# Patient Record
Sex: Female | Born: 1945
Health system: Southern US, Community
[De-identification: ages and names within clinical notes are randomized; demographics above are authoritative.]

## PROBLEM LIST (undated history)

## (undated) DIAGNOSIS — F32A Depression, unspecified: Secondary | ICD-10-CM

## (undated) DIAGNOSIS — F5104 Psychophysiologic insomnia: Secondary | ICD-10-CM

## (undated) DIAGNOSIS — I10 Essential (primary) hypertension: Secondary | ICD-10-CM

## (undated) DIAGNOSIS — E785 Hyperlipidemia, unspecified: Secondary | ICD-10-CM

## (undated) DIAGNOSIS — R945 Abnormal results of liver function studies: Secondary | ICD-10-CM

## (undated) DIAGNOSIS — F329 Major depressive disorder, single episode, unspecified: Secondary | ICD-10-CM

## (undated) DIAGNOSIS — Z789 Other specified health status: Secondary | ICD-10-CM

## (undated) DIAGNOSIS — G4700 Insomnia, unspecified: Secondary | ICD-10-CM

## (undated) HISTORY — DX: Other specified health status: Z78.9

## (undated) HISTORY — DX: Essential (primary) hypertension: I10

## (undated) HISTORY — DX: Hyperlipidemia, unspecified: E78.5

## (undated) HISTORY — DX: Major depressive disorder, single episode, unspecified: F32.9

## (undated) HISTORY — DX: Depression, unspecified: F32.A

## (undated) HISTORY — PX: WISDOM TOOTH EXTRACTION: SHX21

## (undated) HISTORY — PX: COLONOSCOPY: SHX174

## (undated) HISTORY — DX: Psychophysiologic insomnia: F51.04

---
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
## 2004-08-03 ENCOUNTER — Ambulatory Visit: Payer: Self-pay | Admitting: Licensed Clinical Social Worker

## 2004-08-10 ENCOUNTER — Ambulatory Visit: Payer: Self-pay | Admitting: Licensed Clinical Social Worker

## 2004-08-19 ENCOUNTER — Ambulatory Visit: Payer: Self-pay | Admitting: Licensed Clinical Social Worker

## 2004-09-02 ENCOUNTER — Ambulatory Visit: Payer: Self-pay | Admitting: Licensed Clinical Social Worker

## 2004-10-02 LAB — HM COLONOSCOPY

## 2004-11-07 ENCOUNTER — Ambulatory Visit: Payer: Self-pay | Admitting: Licensed Clinical Social Worker

## 2004-11-23 ENCOUNTER — Ambulatory Visit: Payer: Self-pay | Admitting: Licensed Clinical Social Worker

## 2004-12-16 ENCOUNTER — Ambulatory Visit: Payer: Self-pay | Admitting: Licensed Clinical Social Worker

## 2004-12-26 ENCOUNTER — Ambulatory Visit: Payer: Self-pay | Admitting: Licensed Clinical Social Worker

## 2005-01-09 ENCOUNTER — Ambulatory Visit: Payer: Self-pay | Admitting: Licensed Clinical Social Worker

## 2005-01-23 ENCOUNTER — Ambulatory Visit: Payer: Self-pay | Admitting: Licensed Clinical Social Worker

## 2005-02-07 ENCOUNTER — Ambulatory Visit: Payer: Self-pay | Admitting: Licensed Clinical Social Worker

## 2005-02-15 ENCOUNTER — Ambulatory Visit: Payer: Self-pay | Admitting: Licensed Clinical Social Worker

## 2005-02-22 ENCOUNTER — Ambulatory Visit: Payer: Self-pay | Admitting: Licensed Clinical Social Worker

## 2005-03-08 ENCOUNTER — Ambulatory Visit: Payer: Self-pay | Admitting: Licensed Clinical Social Worker

## 2005-03-21 ENCOUNTER — Ambulatory Visit: Payer: Self-pay | Admitting: Licensed Clinical Social Worker

## 2005-03-24 ENCOUNTER — Ambulatory Visit: Payer: Self-pay | Admitting: Internal Medicine

## 2005-04-07 ENCOUNTER — Ambulatory Visit: Payer: Self-pay | Admitting: Internal Medicine

## 2005-04-19 ENCOUNTER — Ambulatory Visit: Payer: Self-pay | Admitting: Internal Medicine

## 2005-04-19 ENCOUNTER — Other Ambulatory Visit: Admission: RE | Admit: 2005-04-19 | Discharge: 2005-04-19 | Payer: Self-pay | Admitting: Internal Medicine

## 2005-04-19 LAB — CONVERTED CEMR LAB

## 2005-05-04 ENCOUNTER — Ambulatory Visit: Payer: Self-pay | Admitting: Internal Medicine

## 2005-05-10 ENCOUNTER — Encounter: Admission: RE | Admit: 2005-05-10 | Discharge: 2005-05-10 | Payer: Self-pay | Admitting: Internal Medicine

## 2005-05-15 ENCOUNTER — Ambulatory Visit: Payer: Self-pay | Admitting: Internal Medicine

## 2005-08-11 ENCOUNTER — Ambulatory Visit: Payer: Self-pay | Admitting: Licensed Clinical Social Worker

## 2006-02-14 ENCOUNTER — Ambulatory Visit: Payer: Self-pay | Admitting: Internal Medicine

## 2006-02-15 ENCOUNTER — Ambulatory Visit: Payer: Self-pay | Admitting: Family Medicine

## 2006-02-20 ENCOUNTER — Ambulatory Visit: Payer: Self-pay | Admitting: Internal Medicine

## 2006-02-21 ENCOUNTER — Encounter: Payer: Self-pay | Admitting: Internal Medicine

## 2006-03-16 ENCOUNTER — Ambulatory Visit: Payer: Self-pay | Admitting: Internal Medicine

## 2006-05-15 ENCOUNTER — Ambulatory Visit: Payer: Self-pay | Admitting: Internal Medicine

## 2006-06-07 ENCOUNTER — Ambulatory Visit: Payer: Self-pay | Admitting: Internal Medicine

## 2006-08-29 ENCOUNTER — Ambulatory Visit: Payer: Self-pay | Admitting: Internal Medicine

## 2006-11-05 ENCOUNTER — Ambulatory Visit: Payer: Self-pay | Admitting: Internal Medicine

## 2006-11-06 LAB — CONVERTED CEMR LAB: TSH: 0.65 microintl units/mL (ref 0.35–5.50)

## 2006-11-12 ENCOUNTER — Ambulatory Visit: Payer: Self-pay | Admitting: Internal Medicine

## 2007-02-12 ENCOUNTER — Encounter: Payer: Self-pay | Admitting: Internal Medicine

## 2007-02-12 DIAGNOSIS — E785 Hyperlipidemia, unspecified: Secondary | ICD-10-CM | POA: Insufficient documentation

## 2007-02-12 DIAGNOSIS — F329 Major depressive disorder, single episode, unspecified: Secondary | ICD-10-CM

## 2007-02-12 DIAGNOSIS — E069 Thyroiditis, unspecified: Secondary | ICD-10-CM | POA: Insufficient documentation

## 2007-02-12 DIAGNOSIS — F32A Depression, unspecified: Secondary | ICD-10-CM | POA: Insufficient documentation

## 2007-03-05 ENCOUNTER — Ambulatory Visit: Payer: Self-pay | Admitting: Internal Medicine

## 2007-03-05 LAB — CONVERTED CEMR LAB
ALT: 21 units/L (ref 0–40)
AST: 24 units/L (ref 0–37)
BUN: 11 mg/dL (ref 6–23)
CO2: 30 meq/L (ref 19–32)
Calcium: 9.8 mg/dL (ref 8.4–10.5)
Chloride: 108 meq/L (ref 96–112)
Cholesterol: 274 mg/dL (ref 0–200)
Creatinine, Ser: 0.7 mg/dL (ref 0.4–1.2)
Direct LDL: 139.9 mg/dL
GFR calc Af Amer: 109 mL/min
GFR calc non Af Amer: 90 mL/min
Glucose, Bld: 96 mg/dL (ref 70–99)
HDL: 102.7 mg/dL (ref >39.0)
Potassium: 4.1 meq/L (ref 3.5–5.1)
Sodium: 142 meq/L (ref 135–145)
TSH: 0.88 microintl units/mL (ref 0.35–5.50)
Total CHOL/HDL Ratio: 2.7
Triglycerides: 81 mg/dL (ref 0–149)
VLDL: 16 mg/dL (ref 0–40)

## 2007-04-03 ENCOUNTER — Ambulatory Visit: Payer: Self-pay | Admitting: Internal Medicine

## 2007-05-22 ENCOUNTER — Telehealth: Payer: Self-pay | Admitting: Internal Medicine

## 2007-10-15 ENCOUNTER — Ambulatory Visit: Payer: Self-pay | Admitting: Internal Medicine

## 2007-10-15 LAB — CONVERTED CEMR LAB: Vit D, 1,25-Dihydroxy: 21 — ABNORMAL LOW (ref 30–89)

## 2007-10-17 LAB — CONVERTED CEMR LAB: TSH: 2.25 microintl units/mL (ref 0.35–5.50)

## 2007-10-29 ENCOUNTER — Ambulatory Visit: Payer: Self-pay | Admitting: Internal Medicine

## 2007-10-29 DIAGNOSIS — G4700 Insomnia, unspecified: Secondary | ICD-10-CM | POA: Insufficient documentation

## 2007-10-29 DIAGNOSIS — E559 Vitamin D deficiency, unspecified: Secondary | ICD-10-CM | POA: Insufficient documentation

## 2007-10-29 HISTORY — DX: Insomnia, unspecified: G47.00

## 2007-12-13 ENCOUNTER — Telehealth: Payer: Self-pay | Admitting: Internal Medicine

## 2007-12-17 ENCOUNTER — Ambulatory Visit: Payer: Self-pay | Admitting: Internal Medicine

## 2007-12-17 DIAGNOSIS — I10 Essential (primary) hypertension: Secondary | ICD-10-CM | POA: Insufficient documentation

## 2007-12-24 ENCOUNTER — Ambulatory Visit: Payer: Self-pay | Admitting: Internal Medicine

## 2007-12-24 LAB — CONVERTED CEMR LAB: TSH: 2.47 microintl units/mL (ref 0.35–5.50)

## 2008-01-21 ENCOUNTER — Ambulatory Visit: Payer: Self-pay | Admitting: Internal Medicine

## 2008-01-27 ENCOUNTER — Encounter: Payer: Self-pay | Admitting: Internal Medicine

## 2008-05-27 ENCOUNTER — Ambulatory Visit: Payer: Self-pay | Admitting: Internal Medicine

## 2008-06-18 ENCOUNTER — Ambulatory Visit: Payer: Self-pay | Admitting: Internal Medicine

## 2008-06-22 LAB — CONVERTED CEMR LAB
Albumin: 4.2 g/dL (ref 3.5–5.2)
Alkaline Phosphatase: 64 units/L (ref 39–117)
BUN: 15 mg/dL (ref 6–23)
Bilirubin, Direct: 0.1 mg/dL (ref 0.0–0.3)
Cholesterol: 279 mg/dL (ref 0–200)
Eosinophils Absolute: 0.1 10*3/uL (ref 0.0–0.7)
GFR calc Af Amer: 108 mL/min
GFR calc non Af Amer: 90 mL/min
HCT: 36.9 % (ref 36.0–46.0)
MCV: 93.4 fL (ref 78.0–100.0)
Monocytes Absolute: 0.3 10*3/uL (ref 0.1–1.0)
Platelets: 318 10*3/uL (ref 150–400)
Potassium: 4.5 meq/L (ref 3.5–5.1)
RDW: 12.4 % (ref 11.5–14.6)
Sodium: 141 meq/L (ref 135–145)
TSH: 2.82 microintl units/mL (ref 0.35–5.50)
Triglycerides: 36 mg/dL (ref 0–149)

## 2008-09-02 ENCOUNTER — Ambulatory Visit: Payer: Self-pay | Admitting: Internal Medicine

## 2008-09-02 LAB — CONVERTED CEMR LAB
HCV Ab: NEGATIVE
Hep B Core Total Ab: NEGATIVE

## 2008-10-12 ENCOUNTER — Encounter: Payer: Self-pay | Admitting: Internal Medicine

## 2008-10-12 LAB — CONVERTED CEMR LAB
ALT: 38 units/L — ABNORMAL HIGH (ref 0–35)
Bilirubin, Direct: 0.1 mg/dL (ref 0.0–0.3)
Total Bilirubin: 0.7 mg/dL (ref 0.3–1.2)

## 2008-12-01 ENCOUNTER — Telehealth: Payer: Self-pay | Admitting: Internal Medicine

## 2008-12-03 ENCOUNTER — Ambulatory Visit: Payer: Self-pay | Admitting: Internal Medicine

## 2008-12-04 ENCOUNTER — Telehealth: Payer: Self-pay | Admitting: *Deleted

## 2008-12-06 LAB — CONVERTED CEMR LAB
Alkaline Phosphatase: 60 units/L (ref 39–117)
Bilirubin, Direct: 0.1 mg/dL (ref 0.0–0.3)
Total Bilirubin: 0.9 mg/dL (ref 0.3–1.2)
Total Protein: 7.2 g/dL (ref 6.0–8.3)

## 2008-12-21 ENCOUNTER — Ambulatory Visit: Payer: Self-pay | Admitting: Internal Medicine

## 2008-12-21 DIAGNOSIS — R945 Abnormal results of liver function studies: Secondary | ICD-10-CM

## 2008-12-21 HISTORY — DX: Abnormal results of liver function studies: R94.5

## 2008-12-31 LAB — HM MAMMOGRAPHY

## 2009-01-21 ENCOUNTER — Encounter: Payer: Self-pay | Admitting: Internal Medicine

## 2009-05-13 ENCOUNTER — Ambulatory Visit: Payer: Self-pay | Admitting: Internal Medicine

## 2009-05-13 LAB — CONVERTED CEMR LAB
ALT: 24 units/L (ref 0–35)
AST: 27 units/L (ref 0–37)
Albumin: 4.5 g/dL (ref 3.5–5.2)
Alkaline Phosphatase: 68 units/L (ref 39–117)
BUN: 12 mg/dL (ref 6–23)
Calcium: 10 mg/dL (ref 8.4–10.5)
Creatinine, Ser: 0.6 mg/dL (ref 0.4–1.2)
GFR calc non Af Amer: 106.46 mL/min (ref >60)
TSH: 3.34 microintl units/mL (ref 0.35–5.50)
Total Protein: 7.3 g/dL (ref 6.0–8.3)

## 2009-06-15 ENCOUNTER — Ambulatory Visit: Payer: Self-pay | Admitting: Internal Medicine

## 2009-06-22 ENCOUNTER — Encounter (INDEPENDENT_AMBULATORY_CARE_PROVIDER_SITE_OTHER): Payer: Self-pay | Admitting: *Deleted

## 2009-07-09 ENCOUNTER — Telehealth: Payer: Self-pay | Admitting: Internal Medicine

## 2009-07-10 ENCOUNTER — Encounter: Payer: Self-pay | Admitting: Family Medicine

## 2009-07-10 ENCOUNTER — Ambulatory Visit: Payer: Self-pay | Admitting: Family Medicine

## 2009-07-10 LAB — CONVERTED CEMR LAB
Glucose, Urine, Semiquant: NEGATIVE
Nitrite: NEGATIVE
Specific Gravity, Urine: <1.005
WBC Urine, dipstick: NEGATIVE

## 2009-08-10 ENCOUNTER — Ambulatory Visit: Payer: Self-pay | Admitting: Internal Medicine

## 2009-08-10 LAB — CONVERTED CEMR LAB: TSH: 1.91 microintl units/mL (ref 0.35–5.50)

## 2009-08-17 ENCOUNTER — Ambulatory Visit: Payer: Self-pay | Admitting: Internal Medicine

## 2009-10-05 ENCOUNTER — Telehealth: Payer: Self-pay | Admitting: *Deleted

## 2009-10-06 ENCOUNTER — Telehealth: Payer: Self-pay | Admitting: *Deleted

## 2009-11-16 ENCOUNTER — Ambulatory Visit: Payer: Self-pay | Admitting: Internal Medicine

## 2009-11-16 DIAGNOSIS — M21619 Bunion of unspecified foot: Secondary | ICD-10-CM | POA: Insufficient documentation

## 2010-01-25 ENCOUNTER — Ambulatory Visit: Payer: Self-pay | Admitting: Internal Medicine

## 2010-01-25 DIAGNOSIS — K5900 Constipation, unspecified: Secondary | ICD-10-CM | POA: Insufficient documentation

## 2010-06-16 ENCOUNTER — Telehealth: Payer: Self-pay | Admitting: Internal Medicine

## 2010-07-12 ENCOUNTER — Ambulatory Visit: Payer: Self-pay | Admitting: Internal Medicine

## 2010-07-12 LAB — CONVERTED CEMR LAB
ALT: 29 units/L (ref 0–35)
AST: 27 units/L (ref 0–37)
Albumin: 4.2 g/dL (ref 3.5–5.2)
Alkaline Phosphatase: 72 units/L (ref 39–117)
BUN: 9 mg/dL (ref 6–23)
Basophils Absolute: 0 10*3/uL (ref 0.0–0.1)
Basophils Relative: 0.7 % (ref 0.0–3.0)
Bilirubin Urine: NEGATIVE
Bilirubin, Direct: 0.1 mg/dL (ref 0.0–0.3)
Blood in Urine, dipstick: NEGATIVE
CO2: 26 meq/L (ref 19–32)
Calcium: 9.7 mg/dL (ref 8.4–10.5)
Chloride: 105 meq/L (ref 96–112)
Cholesterol: 309 mg/dL — ABNORMAL HIGH (ref 0–200)
Creatinine, Ser: 0.6 mg/dL (ref 0.4–1.2)
Direct LDL: 186.8 mg/dL
Eosinophils Absolute: 0.1 10*3/uL (ref 0.0–0.7)
Eosinophils Relative: 2.6 % (ref 0.0–5.0)
GFR calc non Af Amer: 117.29 mL/min (ref >60)
Glucose, Bld: 98 mg/dL (ref 70–99)
Glucose, Urine, Semiquant: NEGATIVE
HCT: 37.7 % (ref 36.0–46.0)
HDL: 90.7 mg/dL (ref >39.00)
Hemoglobin: 12.9 g/dL (ref 12.0–15.0)
Ketones, urine, test strip: NEGATIVE
Lymphocytes Relative: 29.4 % (ref 12.0–46.0)
Lymphs Abs: 1.1 10*3/uL (ref 0.7–4.0)
MCHC: 34.2 g/dL (ref 30.0–36.0)
MCV: 94.5 fL (ref 78.0–100.0)
Monocytes Absolute: 0.4 10*3/uL (ref 0.1–1.0)
Monocytes Relative: 9.5 % (ref 3.0–12.0)
Neutro Abs: 2.1 10*3/uL (ref 1.4–7.7)
Neutrophils Relative %: 57.8 % (ref 43.0–77.0)
Nitrite: NEGATIVE
Platelets: 321 10*3/uL (ref 150.0–400.0)
Potassium: 4 meq/L (ref 3.5–5.1)
Protein, U semiquant: NEGATIVE
RBC: 3.98 M/uL (ref 3.87–5.11)
RDW: 14.4 % (ref 11.5–14.6)
Sodium: 139 meq/L (ref 135–145)
Specific Gravity, Urine: 1.015
TSH: 3.89 microintl units/mL (ref 0.35–5.50)
Total Bilirubin: 0.7 mg/dL (ref 0.3–1.2)
Total CHOL/HDL Ratio: 3
Total Protein: 6.8 g/dL (ref 6.0–8.3)
Triglycerides: 106 mg/dL (ref 0.0–149.0)
Urobilinogen, UA: 0.2
VLDL: 21.2 mg/dL (ref 0.0–40.0)
WBC Urine, dipstick: NEGATIVE
WBC: 3.7 10*3/uL — ABNORMAL LOW (ref 4.5–10.5)
pH: 7

## 2010-07-27 ENCOUNTER — Encounter: Payer: Self-pay | Admitting: Internal Medicine

## 2010-07-27 ENCOUNTER — Other Ambulatory Visit: Admission: RE | Admit: 2010-07-27 | Discharge: 2010-07-27 | Payer: Self-pay | Admitting: Internal Medicine

## 2010-07-27 ENCOUNTER — Ambulatory Visit: Payer: Self-pay | Admitting: Internal Medicine

## 2010-07-27 DIAGNOSIS — E039 Hypothyroidism, unspecified: Secondary | ICD-10-CM | POA: Insufficient documentation

## 2010-07-27 DIAGNOSIS — F1090 Alcohol use, unspecified, uncomplicated: Secondary | ICD-10-CM

## 2010-07-27 HISTORY — DX: Alcohol use, unspecified, uncomplicated: F10.90

## 2010-08-02 ENCOUNTER — Telehealth: Payer: Self-pay | Admitting: Internal Medicine

## 2010-10-11 ENCOUNTER — Other Ambulatory Visit: Payer: Self-pay | Admitting: Internal Medicine

## 2010-10-11 ENCOUNTER — Ambulatory Visit
Admission: RE | Admit: 2010-10-11 | Discharge: 2010-10-11 | Payer: Self-pay | Source: Home / Self Care | Attending: Internal Medicine | Admitting: Internal Medicine

## 2010-10-11 LAB — TSH: TSH: 1.66 u[IU]/mL (ref 0.35–5.50)

## 2010-10-13 ENCOUNTER — Ambulatory Visit
Admission: RE | Admit: 2010-10-13 | Discharge: 2010-10-13 | Payer: Self-pay | Source: Home / Self Care | Attending: Licensed Clinical Social Worker | Admitting: Licensed Clinical Social Worker

## 2010-10-18 ENCOUNTER — Ambulatory Visit
Admission: RE | Admit: 2010-10-18 | Discharge: 2010-10-18 | Payer: Self-pay | Source: Home / Self Care | Attending: Internal Medicine | Admitting: Internal Medicine

## 2010-10-18 DIAGNOSIS — R3915 Urgency of urination: Secondary | ICD-10-CM | POA: Insufficient documentation

## 2010-10-18 LAB — CONVERTED CEMR LAB
Bilirubin Urine: NEGATIVE
Glucose, Urine, Semiquant: NEGATIVE
Protein, U semiquant: NEGATIVE
Specific Gravity, Urine: 1.025
WBC Urine, dipstick: NEGATIVE
pH: 5

## 2010-10-21 ENCOUNTER — Ambulatory Visit
Admission: RE | Admit: 2010-10-21 | Discharge: 2010-10-21 | Payer: Self-pay | Source: Home / Self Care | Attending: Licensed Clinical Social Worker | Admitting: Licensed Clinical Social Worker

## 2010-10-23 ENCOUNTER — Encounter: Payer: Self-pay | Admitting: Internal Medicine

## 2010-10-31 ENCOUNTER — Telehealth: Payer: Self-pay | Admitting: Internal Medicine

## 2010-10-31 ENCOUNTER — Ambulatory Visit: Admit: 2010-10-31 | Payer: Self-pay | Admitting: Licensed Clinical Social Worker

## 2010-11-01 NOTE — Progress Notes (Signed)
Summary: requesting Ambien instead of Lunesta  Phone Note Call from Patient   Caller: Patient Call For: Elaine Headings MD Summary of Call: Pt cannot afford Lunesta, and would like to be changed to Ambien.  [facility] [phone] Initial call taken by: Lynann Beaver CMA,  October 05, 2009 11:43 AM  Follow-up for Phone Call        Pt called and wanted rx cancelled due to [facility] not being coved by ins. See triage messge from 10/06/09 Follow-up by: Romualdo Bolk, CMA (AAMA),  October 06, 2009 11:08 AM

## 2010-11-01 NOTE — Progress Notes (Signed)
Summary: cancel rx  Phone Note Call from Patient Call back at Home Phone [phone]   Caller: Patient- live call Reason for Call: Talk to Nurse Summary of Call: Please cancel her request for Ambien. it is not covered by her ins. Initial call taken by: Warnell Forester,  October 06, 2009 10:47 AM  Follow-up for Phone Call        Request cancelled. Follow-up by: Romualdo Bolk, CMA (AAMA),  October 06, 2009 11:07 AM

## 2010-11-01 NOTE — Assessment & Plan Note (Signed)
Summary: 7 WK / NJR-----PT [facility] // RS   Vital Signs:  Patient profile:   65 year old female Menstrual status:  postmenopausal Height:      63.5 inches Weight:      154 pounds BMI:     26.95 Temp:     98.2 degrees F oral Pulse rate:   80 / minute BP sitting:   132 / 72  (right arm) Cuff size:   regular  Vitals Entered By: Romualdo Bolk, CMA (AAMA) (January 25, 2010 1:40 PM) CC: Follow-up visit on blood pressure, Hypertension Management, Pt states that she is having problems with nausea since last office visit.   History of Present Illness: Elaine Howell comesin  for follow up  of BP  and other issues   Since her last visit she had some nausea  and    constipated   after  taking  herbal laxative. ? if nausea .   and stopped and got better but some now.   ?what tot take  Not a lot of caffiene.  Increasing  1 bottle of wine almost every night. No new symptom .      Hypertension History:      She denies headache, chest pain, palpitations, dyspnea with exertion, orthopnea, PND, peripheral edema, visual symptoms, neurologic problems, syncope, and side effects from treatment.  She notes no problems with any antihypertensive medication side effects.        Positive major cardiovascular risk factors include female age 89 years old or older, hyperlipidemia, and hypertension.  Negative major cardiovascular risk factors include non-tobacco-user status.     Preventive Screening-Counseling & Management  Alcohol-Tobacco     Alcohol drinks/day: 4     Alcohol type: wine     Smoking Status: quit     Year Quit: 10 years ago  Caffeine-Diet-Exercise     Caffeine use/day: 2     Does Patient Exercise: yes     Type of exercise: walking     Times/week: 4  Current Medications (verified): 1)  Cymbalta 60 Mg Cpep (Duloxetine Hcl) .... Take 1 Capsule By Mouth Once A Day 2)  Lunesta 3 Mg  Tabs (Eszopiclone) .Marland Kitchen.. 1 By Mouth Hs For Sleep 3)  Synthroid 75 Mcg Tabs (Levothyroxine Sodium)  .Marland Kitchen.. 1 By Mouth Once Daily 4)  Norvasc 10 Mg Tabs (Amlodipine Besylate) .Marland Kitchen.. 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    Review of Systems  The patient denies anorexia, fever, weight loss, chest pain, syncope, abdominal pain, melena, hematochezia, severe indigestion/heartburn, abnormal bleeding, enlarged lymph nodes, and angioedema.         constipation  Physical Exam  General:  Well-developed,well-nourished,in no acute distress; alert,appropriate and cooperative throughout examination Head:  normocephalic and atraumatic.   Eyes:  vision grossly intact and pupils equal.   Neck:  palpable  no nodules Lungs:  Normal respiratory effort, chest expands symmetrically. Lungs are clear to auscultation, no crackles or wheezes. Heart:  normal rate, regular rhythm, and no gallop.   Pulses:  pulses intact without delay   Extremities:  trace left pedal edema and trace right pedal edema.   Neurologic:  non focal  Skin:  turgor normal, color normal, no ecchymoses, and no petechiae.   Cervical Nodes:  No  lymphadenopathy noted Psych:  Oriented X3, good eye contact, not anxious appearing, and not depressed appearing.     Impression & Recommendations:  Problem # 1:  HYPERTENSION (ICD-401.9)  The following medications were removed from the medication list:    Norvasc 5 Mg Tabs (Amlodipine besylate) .Marland Kitchen... 1 by mouth once daily Her updated medication list for this problem includes:    Norvasc 10 Mg Tabs (Amlodipine besylate) .Marland Kitchen... 1 by mouth once daily  for high blood pressure  Problem # 2:  INSOMNIA UNSPECIFIED (ICD-780.52) alcohol  can effect sleep.  shouldnt take both together. Her updated medication list for this problem includes:    Lunesta 3 Mg Tabs  (Eszopiclone) .Marland Kitchen... 1 by mouth hs for sleep  Problem # 3:  CONSTIPATION (ICD-564.00)  Problem # 4:  etoh intake  counseled about discontinuing.    again.  told she is a heavy drinker and difficulty cutting back means she has a  alcohol problem.    Complete Medication List: 1)  Cymbalta 60 Mg Cpep (Duloxetine hcl) .... Take 1 capsule by mouth once a day 2)  Lunesta 3 Mg Tabs (Eszopiclone) .Marland Kitchen.. 1 by mouth hs for sleep 3)  Synthroid 75 Mcg Tabs (Levothyroxine sodium) .Marland Kitchen.. 1 by mouth once daily 4)  Norvasc 10 Mg Tabs (Amlodipine besylate) .Marland Kitchen.. 1 by mouth once daily  for high blood pressure  Hypertension Assessment/Plan:      The patient's hypertensive risk group is category B: At least one risk factor (excluding diabetes) with no target organ damage.  Today's blood pressure is 132/72.  Her blood pressure goal is < 140/90.  Patient Instructions: 1)  continue same blood pressure medication. 2)  Limit your alcohol   3)  It is not healthy for men to drink more then 2-3 drinks per day or for women to drink more then 1-2 drinks per day.  4)  wellness visit  and labs in 6 months . Prescriptions: LUNESTA 3 MG  TABS (ESZOPICLONE) 1 by mouth hs for sleep  #30 x 3   Entered and Authorized by:   Madelin Headings MD   Signed by:   Madelin Headings MD on 01/25/2010   Method used:   Print then Give to Patient   RxID:   [id number] CYMBALTA 60 MG CPEP (DULOXETINE HCL) Take 1 capsule by mouth once a day  #30 Capsule x 12   Entered and Authorized by:   Madelin Headings MD   Signed by:   Madelin Headings MD on 01/25/2010   Method used:   Electronically to        [facility] [phone]* (retail)       [address]       [place], [place]  [address]       Ph: [phone] or [phone]       Fax: [phone]   RxID:   [id number] NORVASC 10 MG TABS (AMLODIPINE BESYLATE) 1 by mouth once daily  for high blood pressure  #30 x 12   Entered and Authorized by:   Madelin Headings MD   Signed by:   Madelin Headings MD on 01/25/2010   Method used:   Electronically to        [facility] [address] [phone]* (retail)       [address]       [facility]  [place], [place]  [address]       Ph: [phone] or [phone]       Fax: [phone]   RxID:   [id number]

## 2010-11-01 NOTE — Assessment & Plan Note (Signed)
Summary: 3 MNTH ROV//SLM   Vital Signs:  Patient profile:   65 year old female Menstrual status:  postmenopausal Temp:     98.2 degrees F oral Pulse rate:   70 / minute BP sitting:   156 / 80  (left arm) Cuff size:   regular  Vitals Entered By: Romualdo Bolk, CMA (AAMA) (November 16, 2009 1:47 PM)  Serial Vital Signs/Assessments:  Time      Position  BP       Pulse  Resp  Temp     By                     148/90                         Madelin Headings MD  Comments: right arm sitting By: Madelin Headings MD   CC: pt here for fu on meds, Hypertension Management   History of Present Illness: Elaine Howell comesin  for follow up of her BP  and meds.  BP NOt checking readings  but  feels fine  . Limited alcohol for the month of JAnuary but went back in [place]. No CP or sob. Drinks    vitamin water instead of wine.   Bunion right foot hurts at times  . wearing flat shoes increas walking  Sleep No change :   Needs refill  Depression : NO change  on meds   Hypertension History:      She denies headache, chest pain, palpitations, dyspnea with exertion, orthopnea, PND, peripheral edema, visual symptoms, neurologic problems, syncope, and side effects from treatment.  She notes no problems with any antihypertensive medication side effects.        Positive major cardiovascular risk factors include female age 35 years old or older, hyperlipidemia, and hypertension.  Negative major cardiovascular risk factors include non-tobacco-user status.     Preventive Screening-Counseling & Management  Alcohol-Tobacco     Alcohol drinks/day: 4     Alcohol type: wine     Smoking Status: quit     Year Quit: 10 years ago  Caffeine-Diet-Exercise     Caffeine use/day: 2     Does Patient Exercise: yes     Type of exercise: walking     Times/week: 4  Current Medications (verified): 1)  Cymbalta 60 Mg Cpep (Duloxetine Hcl) .... Take 1 Capsule By Mouth Once A Day 2)  Lunesta 3 Mg  Tabs  (Eszopiclone) .Marland Kitchen.. 1 By Mouth Hs For Sleep 3)  Norvasc 5 Mg  Tabs (Amlodipine Besylate) .Marland Kitchen.. 1 By Mouth Once Daily 4)  Synthroid 75 Mcg Tabs (Levothyroxine Sodium) .Marland Kitchen.. 1 By Mouth Once Daily  Allergies (verified): No Known Drug Allergies  Past History:  Past medical, surgical, family and social histories (including risk factors) reviewed, and no changes noted (except as noted below).  Past Medical History: Reviewed history from 05/27/2008 and no changes required. Depression Hyperlipidemia Thyroiditis  pos thyroid antibodies  G2P2  Family History: Reviewed history from 05/27/2008 and no changes required. [place] Heart disease parents  Fa alcohol   Social History: Reviewed history from 08/17/2009 and no changes required. Married Former Smoker Alcohol use-yes  Drug use-no Regular exercise-yes part time g[employer] english as a second language.    Review of Systems  The patient denies anorexia, fever, weight loss, weight gain, vision loss, decreased hearing, chest pain, syncope, dyspnea on exertion, peripheral edema, prolonged cough, headaches, abdominal pain, melena,  hematochezia, severe indigestion/heartburn, difficulty walking, abnormal bleeding, enlarged lymph nodes, and angioedema.    Physical Exam  General:  Well-developed,well-nourished,in no acute distress; alert,appropriate and cooperative throughout examination Head:  normocephalic and atraumatic.   Eyes:  vision grossly intact, pupils equal, and pupils round.   Neck:  palpable  no nodules Lungs:  Normal respiratory effort, chest expands symmetrically. Lungs are clear to auscultation, no crackles or wheezes.no dullness.   Heart:  normal rate, regular rhythm, no murmur, and no gallop.   Pulses:  pulses intact without delay   Extremities:  no clubbing cyanosis or edema  mild bunion right foot   Neurologic:  non focal  Skin:  turgor normal, color normal, no ecchymoses, and no petechiae.   Cervical Nodes:  No lymphadenopathy  noted Psych:  Oriented X3, good eye contact, not anxious appearing, and not depressed appearing.     Impression & Recommendations:  Problem # 1:  HYPERTENSION (ICD-401.9) disc options   counseled about sodium  hidden in food and drink  . consider   adding different med class but for now  increase dose.   HO x 3  Her updated medication list for this problem includes:    Norvasc 5 Mg Tabs (Amlodipine besylate) .Marland Kitchen... 1 by mouth once daily    Norvasc 10 Mg Tabs (Amlodipine besylate) .Marland Kitchen... 1 by mouth once daily  for high blood pressure  Problem # 2:  INSOMNIA UNSPECIFIED (ICD-780.52) Assessment: Unchanged refill meds  Her updated medication list for this problem includes:    Lunesta 3 Mg Tabs (Eszopiclone) .Marland Kitchen... 1 by mouth hs for sleep  Problem # 3:  BUNIONS, RIGHT FOOT (ICD-727.1) disc halthy foot wear can see poidatrist if needed.  Problem # 4:  DEPRESSION (ICD-311) Assessment: Unchanged  Her updated medication list for this problem includes:    Cymbalta 60 Mg Cpep (Duloxetine hcl) .Marland Kitchen... Take 1 capsule by mouth once a day  Complete Medication List: 1)  Cymbalta 60 Mg Cpep (Duloxetine hcl) .... Take 1 capsule by mouth once a day 2)  Lunesta 3 Mg Tabs (Eszopiclone) .Marland Kitchen.. 1 by mouth hs for sleep 3)  Norvasc 5 Mg Tabs (Amlodipine besylate) .Marland Kitchen.. 1 by mouth once daily 4)  Synthroid 75 Mcg Tabs (Levothyroxine sodium) .Marland Kitchen.. 1 by mouth once daily 5)  Norvasc 10 Mg Tabs (Amlodipine besylate) .Marland Kitchen.. 1 by mouth once daily  for high blood pressure  Hypertension Assessment/Plan:      The patient's hypertensive risk group is category B: At least one risk factor (excluding diabetes) with no target organ damage.  Today's blood pressure is 156/80.  Her blood pressure goal is < 140/90. greater than 50% of visit spent in counseling  25 minutes  Patient Instructions: 1)  Look at [facility] diet to decrease  BP 2)  Avoid salt  and limit   1600mg  -2000mg     3)  Increase  NOrvasc to 10 mg per day  4)  then rov  in 6-8 weeks orprn 5)  Call if getting se of med or  not working. We may have to add a different class of medication then.  Prescriptions: LUNESTA 3 MG  TABS (ESZOPICLONE) 1 by mouth hs for sleep  #30 x 3   Entered and Authorized by:   Madelin Headings MD   Signed by:   Madelin Headings MD on 11/16/2009   Method used:   Print then Give to Patient   RxID:   [id number] NORVASC 10 MG TABS (AMLODIPINE BESYLATE) 1  by mouth once daily  for high blood pressure  #30 x 3   Entered and Authorized by:   Madelin Headings MD   Signed by:   Madelin Headings MD on 11/16/2009   Method used:   Electronically to        [facility] [phone]* (retail)       [address]       [place], [place]  [address]       Ph: [phone]       Fax: [phone]   RxID:   [id number]

## 2010-11-01 NOTE — Progress Notes (Signed)
Summary: Lunesta Rx Refill  Phone Note Refill Request Call back at [phone] Message from:  Pharmacy [facility] on June 16, 2010 5:00 PM  Refills Requested: Medication #1:  LUNESTA 3 MG  TABS 1 by mouth hs for sleep   Last Refilled: 05/21/2010   Notes: 5 days early, pt is complety out.  Method Requested: Electronic Initial call taken by: Trixie Dredge,  June 16, 2010 5:00 PM  Follow-up for Phone Call        ok to refill early x 1   [place] appt in [place]  Follow-up by: Madelin Headings MD,  June 17, 2010 1:21 PM  Additional Follow-up for Phone Call Additional follow up Details #1::        Rx called to pharmacy Additional Follow-up by: DeShannon Smith CMA Duncan Dull),  June 17, 2010 1:51 PM    Prescriptions: LUNESTA 3 MG  TABS (ESZOPICLONE) 1 by mouth hs for sleep  #30 x 1   Entered by:   Mervin Hack CMA (AAMA)   Authorized by:   Madelin Headings MD   Signed by:   Mervin Hack CMA (AAMA) on 06/17/2010   Method used:   Telephoned to ...       [facility] [address] [phone]* (retail)       [address]       [place], [place]  [address]       Ph: [phone] or [phone]       Fax: [phone]   RxID:   [id number]

## 2010-11-01 NOTE — Progress Notes (Signed)
Summary: Sore throat no fever, coughing or congestion  Phone Note Call from Patient Call back at Home Phone [phone]   Caller: Patient Summary of Call: Pt states that she is having a sore throat x 2-3 night without relief. Some coughing but no congestion. No fever. Pt has only tried asa but no tylenol or motrin. Pt has not been around anyone with strep. Pt was having some coughing and congestion like a cold last week. I told pt that she should take some motrin or tylenol OTC because it may be viral. Pt still wants to be worked in today if possible. Initial call taken by: Romualdo Bolk, CMA (AAMA),  August 02, 2010 9:24 AM  Follow-up for Phone Call        Per Dr. Fabian Sharp- agree it could be just a virus. As long as she is not running a fever, coughing, congestion or swollen glands, just do the tylenol or motrin. If starts to run a fever or gets swollen glands then call for an appt.  Pt aware. Follow-up by: Romualdo Bolk, CMA (AAMA),  August 02, 2010 11:53 AM

## 2010-11-01 NOTE — Assessment & Plan Note (Signed)
Summary: cpx/pap/njr   Vital Signs:  Patient profile:   65 year old female Menstrual status:  postmenopausal Height:      63.5 inches Weight:      158 pounds Pulse rate:   72 / minute BP sitting:   130 / 80  (left arm) Cuff size:   regular  Vitals Entered By: Romualdo Bolk, CMA (AAMA) (July 27, 2010 3:08 PM) CC: CPX-Discuss doing a pap   History of Present Illness: Elaine Howell comes in today  for   check up and med  managment. Since last visit  HT:   taking 5 mg of norvasc .   LIPIPS  less exercise since hurt back   using treaddmill.  ETOH still using and hard to stop no wd symptoms  Mood: stable but stressed with son not having a job   Here for [employer] AWV:  1.   Risk factors based on Past M, S, F history: 2.   Physical Activities:  reg   working some back pain 3.   Depression/mood:  stable on meds  4.   Hearing: good  5.   ADL's:  independent and works as Runner, broadcasting/film/video 6.   Fall Risk:  none  7.   Home Safety:  reviewed  8.   Height, weight, &visual acuity: good vision except for age  63.   Counseling: as above  10.   Labs ordered based on risk factors:  11.           Referral Coordination 12.           Care Plan 13.            Cognitive Assessment  Pt is A&Ox3,affect,speech,memory,attention,&motor skills appear intact.   Preventive Care Screening  Mammogram:    Date:  12/31/2008    Results:  abnormal   Colonoscopy:    Date:  10/02/2004    Results:  normal   Last Tetanus Booster:    Date:  10/03/2003    Results:  Historical    Preventive Screening-Counseling & Management  Alcohol-Tobacco     Alcohol drinks/day: 4     Alcohol type: wine     Smoking Status: quit     Year Quit: 10 years ago  Caffeine-Diet-Exercise     Caffeine use/day: 2     Does Patient Exercise: yes     Type of exercise: walking     Times/week: 4  Hep-HIV-STD-Contraception     Dental Visit-last 6 months yes  Safety-Violence-Falls     Seat Belt Use: yes     Firearms  in the Home: no firearms in the home     Smoke Detectors: yes     Fall Risk: no falling.   Current Medications (verified): 1)  Cymbalta 60 Mg Cpep (Duloxetine Hcl) .... Take 1 Capsule By Mouth Once A Day 2)  Lunesta 3 Mg  Tabs (Eszopiclone) .Marland Kitchen.. 1 By Mouth Hs For Sleep 3)  Synthroid 75 Mcg Tabs (Levothyroxine Sodium) .Marland Kitchen.. 1 By Mouth Once Daily 4)  Norvasc 10 Mg Tabs (Amlodipine Besylate) .Marland Kitchen.. 1 By Mouth Once Daily  For High Blood Pressure  Allergies (verified): No Known Drug Allergies  Past History:  Past medical, surgical, family and social histories (including risk factors) reviewed, and no changes noted (except as noted below).  Past Medical History: Depression Hyperlipidemia Thyroiditis  pos thyroid antibodies  G2P2 Hypertension Chronic insomnia  Heavy alcohol use  Past History:  Care Management: None Current  Family History: Reviewed history  from 05/27/2008 and no changes required. [place] Heart disease parents  Fa alcohol   Social History: Reviewed history from 01/25/2010 and no changes required. Married Former Smoker Alcohol use-yes  Drug use-no Regular exercise-yes part time g[employer] english as a second language.    20 hours per week.  hhof 2  no pets  Fall Risk:  no falling.  Dental Care w/in 6 mos.:  yes Seat Belt Use:  yes  Review of Systems  The patient denies anorexia, fever, weight loss, vision loss, decreased hearing, hoarseness, chest pain, syncope, dyspnea on exertion, peripheral edema, prolonged cough, hemoptysis, abdominal pain, melena, hematochezia, severe indigestion/heartburn, hematuria, muscle weakness, transient blindness, difficulty walking, unusual weight change, abnormal bleeding, enlarged lymph nodes, angioedema, and breast masses.         back pain left  non radiating    after pulling back    no weakness  stopped  exercising  Physical Exam  General:  Well-developed,well-nourished,in no acute distress; alert,appropriate and cooperative  throughout examination Head:  normocephalic and atraumatic.   Eyes:  PERRL, EOMs full, conjunctiva clear  Ears:  R ear normal and L ear normal.  no external deformities.   Nose:  no external deformity, no external erythema, and no nasal discharge.   Mouth:  good dentition and pharynx pink and moist.   Neck:  palpable  no nodules Breasts:  No mass, nodules, thickening, tenderness, bulging, retraction, inflamation, nipple discharge or skin changes noted.   Lungs:  Normal respiratory effort, chest expands symmetrically. Lungs are clear to auscultation, no crackles or wheezes. Heart:  Normal rate and regular rhythm. S1 and S2 normal without gallop, murmur, click, rub or other extra sounds. Abdomen:  Bowel sounds positive,abdomen soft and non-tender without masses, organomegaly or  noted. Rectal:  No external abnormalities noted. Normal sphincter tone. No rectal masses or tenderness. Genitalia:  Pelvic Exam:        External: normal female genitalia without lesions or masses        Vagina: normal without lesions or masses        Cervix: normal without lesions or masses        Adnexa: normal bimanual exam without masses or fullness        Uterus: normal by palpation        Pap smear: performed Msk:  no joint swelling, no joint warmth, and no redness over joints.  midl oa  Pulses:  pulses intact without delay   Extremities:  trace left pedal edema and trace right pedal edema.   Neurologic:  alert & oriented X3, gait normal, and DTRs symmetrical and normal.   Skin:  turgor normal, color normal, no suspicious lesions, no ecchymoses, no petechiae, and no purpura.   Cervical Nodes:  No lymphadenopathy noted Axillary Nodes:  No palpable lymphadenopathy Inguinal Nodes:  No significant adenopathy Psych:  Oriented X3, normally interactive, good eye contact, not anxious appearing, and not depressed appearing.     Impression & Recommendations:  Problem # 1:  Preventive Health Care  (ICD-V70.0) Discussed nutrition,exercise,diet,healthy weight, vitamin D and calcium.  Orders: EKG w/ Interpretation (93000)  Problem # 2:  ROUTINE GYNECOLOGICAL EXAM (ICD-V72.31)  pap done   Orders: Obtaining Screening PAP Smear (Z6109)  Problem # 3:  ETHANOL ABUSE (ICD-305.00) has tried to stop and decrease but having a problem doing this  no physical withdrawal but is problematic for her health with  elevated bp and other issues    agree she should see counselor and go  rom there  Problem # 4:  HYPERTENSION (ICD-401.9)  Her updated medication list for this problem includes:    Norvasc 10 Mg Tabs (Amlodipine besylate) .Marland Kitchen... 1/2 by mouth once daily or as directed  Orders: EKG w/ Interpretation (93000)  Problem # 5:  THYROIDITIS NOS (ICD-245.9)  Problem # 6:  HYPOTHYROIDISM (ICD-244.9) from above   can try non branded rec for cost reasons but  she can chec into cost at pharmacy.    will have to do more frequent lab test to check this out . and follow .  Her updated medication list for this problem includes:    Synthroid 75 Mcg Tabs (Levothyroxine sodium) .Marland Kitchen... 1 by mouth once daily    Levothroid 75 Mcg Tabs (Levothyroxine sodium) .Marland Kitchen... 1 by mouth once daily  Problem # 7:  financial cost of meds  counseled about options of each med and cost and Discussed risk benefit  of changing   would not change to effexor as rhese kind of meds hould only be changed for non fincancial reasons and are not equivalent and can have side effects.   Is now in the donut hole ...Marland KitchenMarland KitchenMarland Kitchencounseled   Problem # 8:  INSOMNIA UNSPECIFIED (ICD-780.52) prob assoc with all of above    can change to [employer] but may not be the same    pharmacokinetics .   will follow  give a trial  The following medications were removed from the medication list:    Lunesta 3 Mg Tabs (Eszopiclone) .Marland Kitchen... 1 by mouth hs for sleep Her updated medication list for this problem includes:    Ambien 10 Mg Tabs (Zolpidem tartrate) .Marland Kitchen... 1  by mouth at bedtime  or as directed  Problem # 9:  back pain  mechanical and not alarming features   disc  this and will follow up if persistent or  progressive    Complete Medication List: 1)  Cymbalta 60 Mg Cpep (Duloxetine hcl) .... Take 1 capsule by mouth once a day 2)  Synthroid 75 Mcg Tabs (Levothyroxine sodium) .Marland Kitchen.. 1 by mouth once daily 3)  Norvasc 10 Mg Tabs (Amlodipine besylate) .... 1/2 by mouth once daily or as directed 4)  Ambien 10 Mg Tabs (Zolpidem tartrate) .Marland Kitchen.. 1 by mouth at bedtime  or as directed 5)  Levothroid 75 Mcg Tabs (Levothyroxine sodium) .Marland Kitchen.. 1 by mouth once daily  Other Orders: Admin 1st Vaccine (57846) Flu Vaccine 61yrs + (96295) Pneumococcal Vaccine (28413) Admin of Any Addtl Vaccine (24401)  Patient Instructions: 1)  can try generic ambien  but may not last  long through the night.  2)  Can try   change generic  synthroid but  may need to change dose . 3)  for now stay on cymbalta it is a different med than generic effexor.  4)  Agree see Judithe Modest. 5)  recheck TSH in  2 months and then return office visit  Prescriptions: LEVOTHROID 75 MCG TABS (LEVOTHYROXINE SODIUM) 1 by mouth once daily  #30 x 3   Entered and Authorized by:   Madelin Headings MD   Signed by:   Madelin Headings MD on 07/27/2010   Method used:   Print then Give to Patient   RxID:   [id number] AMBIEN 10 MG TABS (ZOLPIDEM TARTRATE) 1 by mouth at bedtime  or as directed  #30 x 5   Entered and Authorized by:   Madelin Headings MD   Signed by:   Madelin Headings MD  on 07/27/2010   Method used:   Print then Give to Patient   RxID:   [id number]    Orders Added: 1)  Admin 1st Vaccine [90471] 2)  Flu Vaccine 6yrs + [35573] 3)  Pneumococcal Vaccine [22025] 4)  Admin of Any Addtl Vaccine [90472] 5)  EKG w/ Interpretation [93000] 6)  Est. Patient 65& > [99397] 7)  Obtaining Screening PAP Smear [Q0091] 8)  Est. Patient Level III [42706]   Immunizations Administered:  Pneumonia  Vaccine:    Vaccine Type: Pneumovax    Site: right deltoid    Mfr: Merck    Dose: 0.5 ml    Route: IM    Given by: Romualdo Bolk, CMA (AAMA)    Exp. Date: 01/26/2012    Lot #: [id number]  Zostavax # 1:    Vaccine Type: Zostavax    Site: right deltoid    Mfr: Merck    Dose: 0.5 ml    Route: [place]    Given by: Romualdo Bolk, CMA (AAMA)    Exp. Date: 05/20/2011    Lot #: [id number]   Immunizations Administered:  Pneumonia Vaccine:    Vaccine Type: Pneumovax    Site: right deltoid    Mfr: Merck    Dose: 0.5 ml    Route: IM    Given by: Romualdo Bolk, CMA (AAMA)    Exp. Date: 01/26/2012    Lot #: [id number]  Zostavax # 1:    Vaccine Type: Zostavax    Site: right deltoid    Mfr: Merck    Dose: 0.5 ml    Route: [place]    Given by: Romualdo Bolk, CMA (AAMA)    Exp. Date: 05/20/2011    Lot #: [id number]      Flu Vaccine Consent Questions     Do you have a history of severe allergic reactions to this vaccine? no    Any prior history of allergic reactions to egg and/or gelatin? no    Do you have a sensitivity to the preservative Thimersol? no    Do you have a past history of Guillan-Barre Syndrome? no    Do you currently have an acute febrile illness? no    Have you ever had a severe reaction to latex? no    Vaccine information given and explained to patient? yes    Are you currently pregnant? no    Lot Number:[id number]   Exp Date:04/01/2011   Site Given  Left Deltoid IM  .lbflu Romualdo Bolk, CMA (AAMA)  July 27, 2010 3:12 PM

## 2010-11-03 NOTE — Assessment & Plan Note (Signed)
Summary: 2 month rov/njr   Vital Signs:  Patient profile:   65 year old female Menstrual status:  postmenopausal Weight:      159 pounds Pulse rate:   60 / minute BP sitting:   150 / 80  (right arm) Cuff size:   regular  Vitals Entered By: Romualdo Bolk, CMA (AAMA) (October 18, 2010 2:07 PM) CC: Follow-up visit on labs- Pt is also having lower back pain and urinary frequency x 2 weeks.   History of Present Illness: Elaine Howell  follow up of a couple of issues .  Urinary issuesfor 2 weeks:  Has  frequency and urgency some lbp no fever . no change in meds xcept zolpidem for sleep Mood :"bad depression       over the holidays .    difficult vacation  .Began appts with Judithe Modest.  Thyroid : never change from brand as no change in cost. HT: no change . Still using etoh  Preventive Screening-Counseling & Management  Alcohol-Tobacco     Alcohol drinks/day: 4     Alcohol type: wine     Smoking Status: quit     Year Quit: 10 years ago  Caffeine-Diet-Exercise     Caffeine use/day: 2     Does Patient Exercise: yes     Type of exercise: walking     Times/week: 4  Current Medications (verified): 1)  Cymbalta 60 Mg Cpep (Duloxetine Hcl) .... Take 1 Capsule By Mouth Once A Day 2)  Norvasc 10 Mg Tabs (Amlodipine Besylate) .... 1/2 By Mouth Once Daily or As Directed 3)  Ambien 10 Mg Tabs (Zolpidem Tartrate) .Marland Kitchen.. 1 By Mouth At Bedtime  or As Directed 4)  Levothroid 75 Mcg Tabs (Levothyroxine Sodium) .Marland Kitchen.. 1 By Mouth Once Daily  Allergies (verified): No Known Drug Allergies  Past History:  Past medical, surgical, family and social histories (including risk factors) reviewed, and no changes noted (except as noted below).  Past Medical History: Reviewed history from 07/27/2010 and no changes required. Depression Hyperlipidemia Thyroiditis  pos thyroid antibodies  G2P2 Hypertension Chronic insomnia  Heavy alcohol use  Past History:  Care Management: None  Current  Family History: Reviewed history from 05/27/2008 and no changes required. [place] Heart disease parents  Fa alcohol   Social History: Reviewed history from 07/27/2010 and no changes required. Married Former Smoker Alcohol use-yes  Drug use-no Regular exercise-yes part time [employer] english as a second language.    20 hours per week.  hhof 2  no pets   Review of Systems       The patient complains of depression.  The patient denies anorexia, fever, weight loss, prolonged cough, melena, hematochezia, severe indigestion/heartburn, hematuria, incontinence, genital sores, difficulty walking, abnormal bleeding, and enlarged lymph nodes.    Physical Exam  General:  Well-developed,well-nourished,in no acute distress; alert,appropriate and cooperative throughout examination Head:  normocephalic and atraumatic.   Eyes:  vision grossly intact.   Neck:  palpable  no nodules Lungs:  Normal respiratory effort, chest expands symmetrically. Lungs are clear to auscultation, no crackles or wheezes. Heart:  Normal rate and regular rhythm. S1 and S2 normal without gallop, murmur, click, rub or other extra sounds. Abdomen:  Bowel sounds positive,abdomen soft and non-tender without masses, organomegaly or  noted. no cva tenderness Pulses:  pulses intact without delay   Extremities:  trace left pedal edema and trace right pedal edema.   Neurologic:  alert & oriented X3, gait normal, and DTRs symmetrical and  normal.   Skin:  turgor normal and color normal.   Cervical Nodes:  No lymphadenopathy noted Psych:  Oriented X3, normally interactive, good eye contact, not anxious appearing, subdued, and tearful.   at times   Impression & Recommendations:  Problem # 1:  HYPOTHYROIDISM (ICD-244.9) stay on same meds  synthroid still a good proce so stay on brand The following medications were removed from the medication list:    Synthroid 75 Mcg Tabs (Levothyroxine sodium) .Marland Kitchen... 1 by mouth once daily Her  updated medication list for this problem includes:    Levothroid 75 Mcg Tabs (Levothyroxine sodium) .Marland Kitchen... 1 by mouth once daily  Labs Reviewed: TSH: 1.66 (10/11/2010)    Chol: 309 (07/12/2010)   HDL: 90.70 (07/12/2010)   LDL: DEL (06/18/2008)   TG: 106.0 (07/12/2010)  Problem # 2:  DEPRESSION (ICD-311) Assessment: Deteriorated no alarm features   disc alcohol as conributor   agree with counsleing and add on augmentation therapy  The following medications were removed from the medication list:    Wellbutrin Xl 150 Mg Xr24h-tab (Bupropion hcl) Her updated medication list for this problem includes:    Cymbalta 60 Mg Cpep (Duloxetine hcl) .Marland Kitchen... Take 1 capsule by mouth once a day    Budeprion Xl 150 Mg Xr24h-tab (Bupropion hcl) .Marland Kitchen... 1 by mouth qam or as directed  Problem # 3:  URINARY URGENCY (MVH-846.96) Assessment: New r/o infection       her urine is pretty clear today Orders: T-Culture, Urine (29528-41324) UA Dipstick w/o Micro (automated)  (81003)  Problem # 4:  ETHANOL ABUSE (ICD-305.00) not a lot but probme adding to  above issues.   Complete Medication List: 1)  Cymbalta 60 Mg Cpep (Duloxetine hcl) .... Take 1 capsule by mouth once a day 2)  Norvasc 10 Mg Tabs (Amlodipine besylate) .... 1/2 by mouth once daily or as directed 3)  Ambien 10 Mg Tabs (Zolpidem tartrate) .Marland Kitchen.. 1 by mouth at bedtime  or as directed 4)  Levothroid 75 Mcg Tabs (Levothyroxine sodium) .Marland Kitchen.. 1 by mouth once daily 5)  Budeprion Xl 150 Mg Xr24h-tab (Bupropion hcl) .Marland Kitchen.. 1 by mouth qam or as directed  Patient Instructions: 1)  stop the alcohol  until better . 2)  continue same thyroid med. 3)  will culture  your urine  and let you know.   results. In the meantime limit pm liquids.  4)  Add on wellbutrin  .   to see if helps your  mood. Continue seeing Judithe Modest in the meantime.  5)  return office visit in 1 month or as needed.  Prescriptions: BUDEPRION XL 150 MG XR24H-TAB (BUPROPION HCL) 1 by mouth qam or  as directed  #30 x 3   Entered and Authorized by:   Madelin Headings MD   Signed by:   Madelin Headings MD on 10/18/2010   Method used:   Electronically to        [facility] [address] [phone]* (retail)       [address]       [place], [place]  [address]       Ph: [phone] or [phone]       Fax: [phone]   RxID:   [id number]    Orders Added: 1)  T-Culture, Urine [[id number]] 2)  UA Dipstick w/o Micro (automated)  [81003] 3)  Est. Patient Level IV [06237]    Laboratory Results   Urine  Tests  Date/Time Received: October 18, 2010 3:00 PM   Routine Urinalysis   Glucose: negative   (Normal Range: Negative) Bilirubin: negative   (Normal Range: Negative) Ketone: trace (5)   (Normal Range: Negative) Spec. Gravity: 1.025   (Normal Range: 1.003-1.035) Blood: negative   (Normal Range: Negative) pH: 5.0   (Normal Range: 5.0-8.0) Protein: negative   (Normal Range: Negative) Urobilinogen: 0.2   (Normal Range: 0-1) Nitrite: negative   (Normal Range: Negative) Leukocyte Esterace: negative   (Normal Range: Negative)    Comments: Romualdo Bolk, CMA (AAMA)  October 18, 2010 3:01 PM

## 2010-11-07 ENCOUNTER — Encounter: Payer: Self-pay | Admitting: Internal Medicine

## 2010-11-07 ENCOUNTER — Ambulatory Visit: Payer: Self-pay | Admitting: Licensed Clinical Social Worker

## 2010-11-09 NOTE — Progress Notes (Signed)
  Phone Note Outgoing Call   Call placed by: Gwenn T. Call placed to: Patient Details for Reason:  Urine Culture Summary of Call: Called and left a message. Was going to inform patient that urine culture wasn't done per Dr. Fabian Sharp and see how she was feeling. Waiting for call back.  Follow-up for Phone Call        Patient called and is feeling better.

## 2010-11-11 ENCOUNTER — Ambulatory Visit (INDEPENDENT_AMBULATORY_CARE_PROVIDER_SITE_OTHER): Payer: 59 | Admitting: Licensed Clinical Social Worker

## 2010-11-11 DIAGNOSIS — F411 Generalized anxiety disorder: Secondary | ICD-10-CM

## 2010-11-11 DIAGNOSIS — F331 Major depressive disorder, recurrent, moderate: Secondary | ICD-10-CM

## 2010-11-21 ENCOUNTER — Ambulatory Visit (INDEPENDENT_AMBULATORY_CARE_PROVIDER_SITE_OTHER): Payer: 59 | Admitting: Licensed Clinical Social Worker

## 2010-11-21 DIAGNOSIS — F331 Major depressive disorder, recurrent, moderate: Secondary | ICD-10-CM

## 2010-11-29 ENCOUNTER — Ambulatory Visit: Payer: Self-pay | Admitting: Internal Medicine

## 2010-12-02 ENCOUNTER — Ambulatory Visit: Payer: Self-pay | Admitting: Internal Medicine

## 2010-12-05 ENCOUNTER — Ambulatory Visit (INDEPENDENT_AMBULATORY_CARE_PROVIDER_SITE_OTHER): Payer: 59 | Admitting: Licensed Clinical Social Worker

## 2010-12-05 DIAGNOSIS — F331 Major depressive disorder, recurrent, moderate: Secondary | ICD-10-CM

## 2010-12-06 ENCOUNTER — Encounter: Payer: Self-pay | Admitting: Internal Medicine

## 2010-12-06 ENCOUNTER — Ambulatory Visit (INDEPENDENT_AMBULATORY_CARE_PROVIDER_SITE_OTHER): Payer: 59 | Admitting: Internal Medicine

## 2010-12-06 VITALS — BP 120/80 | HR 60 | Temp 98.6°F

## 2010-12-06 DIAGNOSIS — F329 Major depressive disorder, single episode, unspecified: Secondary | ICD-10-CM

## 2010-12-06 DIAGNOSIS — E785 Hyperlipidemia, unspecified: Secondary | ICD-10-CM

## 2010-12-06 DIAGNOSIS — I10 Essential (primary) hypertension: Secondary | ICD-10-CM

## 2010-12-06 DIAGNOSIS — F1010 Alcohol abuse, uncomplicated: Secondary | ICD-10-CM

## 2010-12-06 DIAGNOSIS — F3289 Other specified depressive episodes: Secondary | ICD-10-CM

## 2010-12-06 NOTE — Progress Notes (Signed)
  Subjective:    Patient ID: Elaine Howell, female    DOB: Aug 20, 1946, 65 y.o.   MRN: [id number]  HPI  Patient comes in today for a number of issues to follow.  #1 hypertension:   She is now on 10 mg of Norvasc without side effects noted she hasn't checked her blood pressure on her own.  #2 depression:   She is now taking Wellbutrin and on medication and notices increased improvement in her mood energy level. She denies any side effects. She feels like she is doing pretty well at this point she is seeing Judithe Modest in counseling.  #3 heavy alcohol use;   In counseling working on it.   Review of Systems  2 vision changes chest pain shortness of breath fever bleeding. Not suicidal has chronic insomnia rest of ros no change    Objective:   Physical Exam WDWN in  nad  Affect brighter and oriented x 3  HEENT ess nl neck no tenderness palp thyroid no nodules Chest:  Clear to A&P without wheezes rales or rhonchi CV:  S1-S2 no gallops or murmurs peripheral perfusion is normal No clubbing cyanosis or edema  Oriented x 3. Normal cognition, attention.  Minimal  anxious or depressed appearing   Good eye contact .Neuro non focal  No tremor.        Assessment & Plan:  HT  Better  DEPRESSION better continue augmentation meds ...  Can increase if needed ETOH  Seeing counselor  LIPDS: .lsi  May consider meds in future   Reviewed healthy eating  Last labs 300 in fall .

## 2010-12-06 NOTE — Assessment & Plan Note (Signed)
Continue lifestyle intervention healthy eating and exercise . Consider meds in future  .

## 2010-12-06 NOTE — Assessment & Plan Note (Signed)
Improved on wellbutrin augmentation  NO se noted

## 2010-12-06 NOTE — Assessment & Plan Note (Signed)
Continue lifestyle intervention healthy eating and exercise .  And meds better today

## 2010-12-06 NOTE — Patient Instructions (Signed)
Stay on same medication mcontinue mediterranean diet and avoid extra salt and sodium. return office visit in 3 months or as needed

## 2010-12-06 NOTE — Assessment & Plan Note (Signed)
Seeing Elaine Howell for counswling.

## 2010-12-19 ENCOUNTER — Ambulatory Visit (INDEPENDENT_AMBULATORY_CARE_PROVIDER_SITE_OTHER): Payer: 59 | Admitting: Licensed Clinical Social Worker

## 2010-12-19 DIAGNOSIS — F331 Major depressive disorder, recurrent, moderate: Secondary | ICD-10-CM

## 2010-12-26 ENCOUNTER — Other Ambulatory Visit: Payer: Self-pay | Admitting: Internal Medicine

## 2010-12-30 ENCOUNTER — Ambulatory Visit (INDEPENDENT_AMBULATORY_CARE_PROVIDER_SITE_OTHER): Payer: 59 | Admitting: Licensed Clinical Social Worker

## 2010-12-30 DIAGNOSIS — F331 Major depressive disorder, recurrent, moderate: Secondary | ICD-10-CM

## 2011-01-16 ENCOUNTER — Ambulatory Visit (INDEPENDENT_AMBULATORY_CARE_PROVIDER_SITE_OTHER): Payer: 59 | Admitting: Licensed Clinical Social Worker

## 2011-01-16 DIAGNOSIS — F331 Major depressive disorder, recurrent, moderate: Secondary | ICD-10-CM

## 2011-01-27 ENCOUNTER — Ambulatory Visit (INDEPENDENT_AMBULATORY_CARE_PROVIDER_SITE_OTHER): Payer: 59 | Admitting: Licensed Clinical Social Worker

## 2011-01-27 DIAGNOSIS — F331 Major depressive disorder, recurrent, moderate: Secondary | ICD-10-CM

## 2011-02-09 ENCOUNTER — Other Ambulatory Visit: Payer: Self-pay | Admitting: Internal Medicine

## 2011-02-09 MED ORDER — ZOLPIDEM TARTRATE 10 MG PO TABS: 10.0000 mg | ORAL_TABLET | Freq: Every evening | ORAL | Status: DC | PRN

## 2011-02-09 NOTE — Telephone Encounter (Signed)
Per Dr. Fabian Sharp- ok x 3. Rx called in.

## 2011-02-14 ENCOUNTER — Other Ambulatory Visit: Payer: Self-pay | Admitting: Internal Medicine

## 2011-02-20 ENCOUNTER — Other Ambulatory Visit: Payer: Self-pay | Admitting: Internal Medicine

## 2011-02-23 ENCOUNTER — Other Ambulatory Visit: Payer: Self-pay | Admitting: Internal Medicine

## 2011-03-06 ENCOUNTER — Ambulatory Visit: Payer: 59 | Admitting: Internal Medicine

## 2011-03-10 ENCOUNTER — Encounter: Payer: Self-pay | Admitting: Internal Medicine

## 2011-03-10 ENCOUNTER — Ambulatory Visit (INDEPENDENT_AMBULATORY_CARE_PROVIDER_SITE_OTHER): Payer: Medicare Other | Admitting: Internal Medicine

## 2011-03-10 VITALS — BP 138/78 | HR 72 | Wt 149.0 lb

## 2011-03-10 DIAGNOSIS — F3289 Other specified depressive episodes: Secondary | ICD-10-CM

## 2011-03-10 DIAGNOSIS — I10 Essential (primary) hypertension: Secondary | ICD-10-CM

## 2011-03-10 DIAGNOSIS — G4700 Insomnia, unspecified: Secondary | ICD-10-CM

## 2011-03-10 DIAGNOSIS — F329 Major depressive disorder, single episode, unspecified: Secondary | ICD-10-CM

## 2011-03-10 MED ORDER — BUPROPION HCL ER (XL) 300 MG PO TB24: 300.0000 mg | ORAL_TABLET | ORAL | Status: DC

## 2011-03-10 NOTE — Progress Notes (Signed)
  Subjective:    Patient ID: Elaine Howell, female    DOB: [birth year], 65 y.o.   MRN: [id number]  HPI Patient comes in today for followup of multiple medical issues.  HT: Has been taking amlodipine every day without side effect has not been checking her readings and forgot to bring her machine.   Mood: Meds still the same  Has some problem related to surgery in May and had cataracts with implants  . Is tolerating 150 will be returning in and seems to be doing okay. ETOH : the same  Sleep: Still taking Ambien. Thyroid no change in meds .   Review of Systems Negative for chest pain shortness of breath changes in skin following. He is tired at times. No significant edema. Back pain strain after lifting   Seems to do better with walking.      Objective:   Physical Exam WdWN in nad  Heent grossly nl CV s1 s2 no g or m No clubbing cyanosis or edema  Gait normal Affect no change        Assessment & Plan:  HT Seems to be controlled although upper limits of normal. Her options of adding other medications however I think lifestyle would be appropriate.  Mood  some improvement no side effect of Wellbutrin. Discussed increasing it to 300 mg a day so she could get best effect we'll do a trial of this. If it is helping we'll continue otherwise call. Insomnia   appears to be chronic multiple etiology discussed a dependency issue with Ambien and at this time we'll refill and continue. To be aware of withdrawal symptoms if she tries stopping medication. She could try decreasing dose at some point. Back pain  seems like a strain walking okay avoid bending and lifting and prolonged sitting. Etoh use :    rec decrease contributing.   increase  To 300 mg wellbutrin.   Counseled.     Plan wellness with full set of labs in the fall Total visit > 50% spent counseling and coordinating care

## 2011-03-10 NOTE — Assessment & Plan Note (Signed)
No change discussed interference with sleep and mood with alcohol.

## 2011-03-10 NOTE — Patient Instructions (Signed)
Continue medications Increase wellbutrin to 300 mg per day and see how this does for your mood. Exercise and low salt will help your blood pressure .  If  ok then check up and lab   In [facility] November . Cal lin meantime if needed

## 2011-03-24 ENCOUNTER — Ambulatory Visit (INDEPENDENT_AMBULATORY_CARE_PROVIDER_SITE_OTHER): Payer: Medicare Other | Admitting: Licensed Clinical Social Worker

## 2011-03-24 DIAGNOSIS — F331 Major depressive disorder, recurrent, moderate: Secondary | ICD-10-CM

## 2011-04-10 ENCOUNTER — Ambulatory Visit (INDEPENDENT_AMBULATORY_CARE_PROVIDER_SITE_OTHER): Payer: 59 | Admitting: Licensed Clinical Social Worker

## 2011-04-10 DIAGNOSIS — F331 Major depressive disorder, recurrent, moderate: Secondary | ICD-10-CM

## 2011-04-21 ENCOUNTER — Other Ambulatory Visit: Payer: Self-pay | Admitting: Internal Medicine

## 2011-05-11 ENCOUNTER — Other Ambulatory Visit: Payer: Self-pay | Admitting: Internal Medicine

## 2011-05-11 NOTE — Telephone Encounter (Signed)
Call in #30 with no rf  

## 2011-05-12 NOTE — Telephone Encounter (Signed)
rx called into pharmacy

## 2011-06-12 ENCOUNTER — Other Ambulatory Visit: Payer: Self-pay | Admitting: Family Medicine

## 2011-06-12 NOTE — Telephone Encounter (Signed)
LOV 03/10/11 NOV 07/10/11

## 2011-06-12 NOTE — Telephone Encounter (Signed)
Per Dr. Panosh- ok x 1 

## 2011-06-12 NOTE — Telephone Encounter (Signed)
Pt called req refill of zolpidem (AMBIEN) 10 MG tablet to [facility] on [address] [phone]

## 2011-06-13 ENCOUNTER — Other Ambulatory Visit: Payer: Self-pay | Admitting: Internal Medicine

## 2011-06-13 NOTE — Telephone Encounter (Signed)
rx called in

## 2011-07-03 ENCOUNTER — Other Ambulatory Visit (INDEPENDENT_AMBULATORY_CARE_PROVIDER_SITE_OTHER): Payer: 59

## 2011-07-03 DIAGNOSIS — I10 Essential (primary) hypertension: Secondary | ICD-10-CM

## 2011-07-03 DIAGNOSIS — Z0000 Encounter for general adult medical examination without abnormal findings: Secondary | ICD-10-CM

## 2011-07-03 DIAGNOSIS — E039 Hypothyroidism, unspecified: Secondary | ICD-10-CM

## 2011-07-03 DIAGNOSIS — Z79899 Other long term (current) drug therapy: Secondary | ICD-10-CM

## 2011-07-03 DIAGNOSIS — G4700 Insomnia, unspecified: Secondary | ICD-10-CM

## 2011-07-03 LAB — CBC WITH DIFFERENTIAL/PLATELET
Basophils Relative: 1.1 % (ref 0.0–3.0)
Eosinophils Absolute: 0.1 10*3/uL (ref 0.0–0.7)
Eosinophils Relative: 1.9 % (ref 0.0–5.0)
Lymphocytes Relative: 39.7 % (ref 12.0–46.0)
MCHC: 33.6 g/dL (ref 30.0–36.0)
Neutrophils Relative %: 46.5 % (ref 43.0–77.0)
RBC: 4 Mil/uL (ref 3.87–5.11)
WBC: 2.9 10*3/uL — ABNORMAL LOW (ref 4.5–10.5)

## 2011-07-03 LAB — POCT URINALYSIS DIPSTICK
Leukocytes, UA: NEGATIVE
Nitrite, UA: NEGATIVE
Protein, UA: NEGATIVE
Urobilinogen, UA: 0.2
pH, UA: 5.5

## 2011-07-03 LAB — LIPID PANEL
HDL: 109 mg/dL (ref >39.00)
Triglycerides: 57 mg/dL (ref 0.0–149.0)

## 2011-07-03 LAB — BASIC METABOLIC PANEL
Calcium: 9.1 mg/dL (ref 8.4–10.5)
Creatinine, Ser: 0.7 mg/dL (ref 0.4–1.2)
GFR: 88.53 mL/min (ref >60.00)

## 2011-07-03 LAB — HEPATIC FUNCTION PANEL
ALT: 20 U/L (ref 0–35)
Total Bilirubin: 0.4 mg/dL (ref 0.3–1.2)

## 2011-07-03 LAB — TSH: TSH: 2.17 u[IU]/mL (ref 0.35–5.50)

## 2011-07-10 ENCOUNTER — Encounter: Payer: Self-pay | Admitting: Internal Medicine

## 2011-07-10 ENCOUNTER — Ambulatory Visit (INDEPENDENT_AMBULATORY_CARE_PROVIDER_SITE_OTHER): Payer: 59 | Admitting: Internal Medicine

## 2011-07-10 DIAGNOSIS — E785 Hyperlipidemia, unspecified: Secondary | ICD-10-CM

## 2011-07-10 DIAGNOSIS — Z789 Other specified health status: Secondary | ICD-10-CM

## 2011-07-10 DIAGNOSIS — E039 Hypothyroidism, unspecified: Secondary | ICD-10-CM

## 2011-07-10 DIAGNOSIS — D72819 Decreased white blood cell count, unspecified: Secondary | ICD-10-CM | POA: Insufficient documentation

## 2011-07-10 DIAGNOSIS — I10 Essential (primary) hypertension: Secondary | ICD-10-CM

## 2011-07-10 DIAGNOSIS — Z7289 Other problems related to lifestyle: Secondary | ICD-10-CM

## 2011-07-10 DIAGNOSIS — Z0000 Encounter for general adult medical examination without abnormal findings: Secondary | ICD-10-CM

## 2011-07-10 DIAGNOSIS — G4700 Insomnia, unspecified: Secondary | ICD-10-CM

## 2011-07-10 MED ORDER — ZOLPIDEM TARTRATE 10 MG PO TABS: 10.0000 mg | ORAL_TABLET | Freq: Every evening | ORAL | Status: DC | PRN

## 2011-07-10 NOTE — Assessment & Plan Note (Signed)
Intensify lifestyle interventions.  pretty healthy diet and exercise rec get ldl to below 160 and prefer 130

## 2011-07-10 NOTE — Assessment & Plan Note (Signed)
140/80 today  rec decrease etoh

## 2011-07-10 NOTE — Progress Notes (Signed)
Subjective:    Elaine Howell is a 65 y.o. female who presents for a wellness and exan.   BP no t checking  But taking meds every day up with nervous .  Depression   Ok on meds anxiety   See NOTE below    Cardiac risk factors: advanced age (older than 80 for men, 104 for women) and hypertension.  Activities of Daily Living  In your present state of health, do you have any difficulty performing the following activities?:  Preparing food and eating?: No Bathing yourself: No Getting dressed: No Using the toilet:No Moving around from place to place: No In the past year have you fallen or had a near fall?:No  Current exercise habits: Gym/ health club routine includes treadmill and Trainer 2 times a week.   Dietary issues discussed:    Depression Screen (Note: if answer to either of the following is "Yes", then a more complete depression screening is indicated)  Q1: Over the past two weeks, have you felt down, depressed or hopeless?no Q2: Over the past two weeks, have you felt little interest or pleasure in doing things? no    Review of Systems ROS:  GEN/ HEENTNo fever, significant weight changes sweats headaches vision problems hearing changes, CV/ PULM; No chest pain shortness of breath cough, syncope,edema  change in exercise tolerance. GI /GU: No adominal pain, vomiting, change in bowel habits. No blood in the stool. No significant GU symptoms. SKIN/HEME: ,no acute skin rashes suspicious lesions or bleeding. No lymphadenopathy, nodules, masses.  NEURO/ PSYCH:  No neurologic signs such as weakness numbness No depression anxiety. IMM/ Allergy: No unusual infections.  Allergy .   REST of 12 system review negative  Past history family history social history reviewed in the electronic medical record.    Objective:     Vision done June 1,2012; Normal done by Dr. Lahoma Crocker. Blood pressure 150/80, pulse 88, height 5' 3.25" (1.607 m), weight 154 lb (69.854 kg). Body  mass index is 27.06 kg/(m^2). Wt Readings from Last 3 Encounters:  07/10/11 154 lb (69.854 kg)  03/10/11 149 lb (67.586 kg)  10/18/10 159 lb (72.122 kg)    Physical Exam: Vital signs reviewed [id number] is a well-developed well-nourished alert cooperative  white female who appears her stated age in no acute distress.  HEENT: normocephalic  traumatic , Eyes: PERRL EOM's full, conjunctiva clear, Nares: paten,t no deformity discharge or tenderness., Ears: no deformity EAC's clear TMs with normal landmarks. Mouth: clear OP, no lesions, edema.  Moist mucous membranes. Dentition in adequate repair. NECK: supple without masses, thyromegaly or bruits. CHEST/PULM:  Clear to auscultation and percussion breath sounds equal no wheeze , rales or rhonchi. No chest wall deformities or tenderness. CV: PMI is nondisplaced, S1 S2 no gallops, murmurs, rubs. Peripheral pulses are full without delay.No JVD .  ABDOMEN: Bowel sounds normal nontender  No guard or rebound, no hepato splenomegal no CVA tenderness.  No hernia. Extremtities:  No clubbing cyanosis or edema, no acute joint swelling or redness no focal atrophy NEURO:  Oriented x3, cranial nerves 3-12 appear to be intact, no obvious focal weakness,gait within normal limits no abnormal reflexes or asymmetrical SKIN: No acute rashes normal turgor, color, no bruising or petechiae. PSYCH: Oriented, good eye contact, no obvious depression anxiety, cognition and judgment appear normal.   Assessment:  Preventive Health Care  See note below     Plan:     During the course of the visit the patient was educated  and counseled about appropriate screening and preventive services including:     Patient Instructions (the written plan) was given to the patient.

## 2011-07-10 NOTE — Patient Instructions (Addendum)
Alcohol and Remus Loffler are unhealthy to take at the same time. Try 5 mg ambien and decrease alcohol. We need to repeat the blood count in a month and if stll very low may get  A hematology consult about the low WBC. No change in other medications

## 2011-07-11 MED ORDER — BUPROPION HCL ER (XL) 300 MG PO TB24: 300.0000 mg | ORAL_TABLET | ORAL | Status: DC

## 2011-07-11 MED ORDER — DULOXETINE HCL 60 MG PO CPEP: 60.0000 mg | ORAL_CAPSULE | Freq: Every day | ORAL | Status: DC

## 2011-07-11 MED ORDER — AMLODIPINE BESYLATE 10 MG PO TABS: 10.0000 mg | ORAL_TABLET | Freq: Every day | ORAL | Status: DC

## 2011-07-11 MED ORDER — LEVOTHYROXINE SODIUM 75 MCG PO TABS: 75.0000 ug | ORAL_TABLET | Freq: Every day | ORAL | Status: DC

## 2011-07-15 ENCOUNTER — Encounter: Payer: Self-pay | Admitting: Internal Medicine

## 2011-07-15 DIAGNOSIS — Z789 Other specified health status: Secondary | ICD-10-CM | POA: Insufficient documentation

## 2011-07-15 DIAGNOSIS — Z7289 Other problems related to lifestyle: Secondary | ICD-10-CM | POA: Insufficient documentation

## 2011-07-15 NOTE — Progress Notes (Signed)
Subjective:    Patient ID: Elaine Howell, female    DOB: 03-Mar-1946, 65 y.o.   MRN: [id number]  HPI Patient comes in today for preventive visit and follow-up of medical issues. Update of her history since her last visit. Since last visit no major change in health HT: no change Thyroid taking med no change hair. EYES has cataract surgery 6 12  Shapiro Continue sot drink etoh daily  2-4 wine  Hard time stopping.  Still takes med for sleep.    Hearing:  Good   Vision:  No limitations at present .  Safety:  Has smoke detector and wears seat belts.  No firearms. No excess sun exposure. Sees dentist regularly.  Falls:  no  Advance directive :  Reviewed   Memory: Felt to be good  , no concern from her or her family.  Depression: No anhedonia unusual crying or depressive symptoms on meds doing better  Nutrition: Eats well balanced diet; adequate calcium and vitamin D. No swallowing chewiing problems.  Injury: no major injuries in the last six months.  Other healthcare providers:  Reviewed today .  Social:  hh of 2 . No pets.   Preventive parameters: up-to-date on colonoscopy, mammogram, immunizations. Including Tdap and pneumovax.  ADLS:   There are no problems or need for assistance  driving, feeding, obtaining food, dressing, toileting and bathing, managing money using phone. She is independent.   Review of Systems ROS:  GEN/ HEENTNo fever, significant weight changes sweats headaches vision problems hearing changes, CV/ PULM; No chest pain shortness of breath cough, syncope,edema  change in exercise tolerance. GI /GU: No adominal pain, vomiting, change in bowel habits. No blood in the stool. No significant GU symptoms. SKIN/HEME: ,no acute skin rashes suspicious lesions or bleeding. No lymphadenopathy, nodules, masses.  NEURO/ PSYCH:  No neurologic signs such as weakness numbness No depression anxiety. IMM/ Allergy: No unusual infections.  Allergy .   REST of 12  system review negative     Objective:   Physical Exam Physical Exam: Vital signs reviewed [id number] is a well-developed well-nourished alert cooperative  white female who appears her stated age in no acute distress.  HEENT: normocephalic  traumatic , Eyes: PERRL EOM's full, conjunctiva clear, Nares: paten,t no deformity discharge or tenderness., Ears: no deformity EAC's clear TMs with normal landmarks. Mouth: clear OP, no lesions, edema.  Moist mucous membranes. Dentition in adequate repair. NECK: supple without masses, thyromegaly or bruits. Breast: normal by inspection . No dimpling, discharge, masses, tenderness or discharge .  CHEST/PULM:  Clear to auscultation and percussion breath sounds equal no wheeze , rales or rhonchi. No chest wall deformities or tenderness. CV: PMI is nondisplaced, S1 S2 no gallops, murmurs, rubs. Peripheral pulses are full without delay.No JVD .  ABDOMEN: Bowel sounds normal nontender  No guard or rebound, no hepato splenomegal no CVA tenderness.  No hernia. Extremtities:  No clubbing cyanosis or edema, no acute joint swelling or redness no focal atrophy NEURO:  Oriented x3, cranial nerves 3-12 appear to be intact, no obvious focal weakness,gait within normal limits no abnormal reflexes or asymmetrical SKIN: No acute rashes normal turgor, color, no bruising or petechiae. PSYCH: Oriented, good eye contact, no obvious depression anxiety, cognition and judgment appear normal. Monotone voice ( no change)   Lab Results  Component Value Date   WBC 2.9* 07/03/2011   HGB 12.9 07/03/2011   HCT 38.3 07/03/2011   PLT 300.0 07/03/2011   GLUCOSE 87 07/03/2011  CHOL 289* 07/03/2011   TRIG 57.0 07/03/2011   HDL 109.00 07/03/2011   LDLDIRECT 164.9 07/03/2011   ALT 20 07/03/2011   AST 21 07/03/2011   NA 140 07/03/2011   K 4.1 07/03/2011   CL 106 07/03/2011   CREATININE 0.7 07/03/2011   BUN 14 07/03/2011   CO2 25 07/03/2011   TSH 2.17 07/03/2011       Assessment & Plan:    Preventive Health Care Counseled regarding healthy nutrition, exercise, sleep, injury prevention, calcium vit d and healthy weight . HT:  Decrease etoh  Better on repeat 130/80 Thyroid no change ETOH    Counseled. Again  rec see counselor  Or look into this .  Depression  Stable  Disc etoh and depression Sleep  Should not take etoh and ambien Leukopenia  Now below 3000  No sx  Disc poss causes but exam ok  Will repeat and if still low get consult

## 2011-08-07 ENCOUNTER — Other Ambulatory Visit (INDEPENDENT_AMBULATORY_CARE_PROVIDER_SITE_OTHER): Payer: 59

## 2011-08-07 DIAGNOSIS — D649 Anemia, unspecified: Secondary | ICD-10-CM

## 2011-08-07 LAB — CBC WITH DIFFERENTIAL/PLATELET
Basophils Absolute: 0 10*3/uL (ref 0.0–0.1)
Lymphocytes Relative: 29.6 % (ref 12.0–46.0)
Lymphs Abs: 1.3 10*3/uL (ref 0.7–4.0)
Monocytes Relative: 10.2 % (ref 3.0–12.0)
Neutrophils Relative %: 58.7 % (ref 43.0–77.0)
Platelets: 312 10*3/uL (ref 150.0–400.0)
RDW: 13.4 % (ref 11.5–14.6)
WBC: 4.3 10*3/uL — ABNORMAL LOW (ref 4.5–10.5)

## 2011-08-11 ENCOUNTER — Telehealth: Payer: Self-pay | Admitting: *Deleted

## 2011-08-11 NOTE — Telephone Encounter (Signed)
Please call pt with lab results

## 2011-08-15 ENCOUNTER — Encounter: Payer: Self-pay | Admitting: *Deleted

## 2011-08-15 NOTE — Telephone Encounter (Signed)
See results note. 

## 2011-10-10 ENCOUNTER — Ambulatory Visit (INDEPENDENT_AMBULATORY_CARE_PROVIDER_SITE_OTHER): Payer: 59 | Admitting: Internal Medicine

## 2011-10-10 ENCOUNTER — Encounter: Payer: Self-pay | Admitting: Internal Medicine

## 2011-10-10 VITALS — BP 134/80 | HR 78 | Wt 143.0 lb

## 2011-10-10 DIAGNOSIS — Z789 Other specified health status: Secondary | ICD-10-CM

## 2011-10-10 DIAGNOSIS — Z7289 Other problems related to lifestyle: Secondary | ICD-10-CM

## 2011-10-10 DIAGNOSIS — G4700 Insomnia, unspecified: Secondary | ICD-10-CM

## 2011-10-10 DIAGNOSIS — I10 Essential (primary) hypertension: Secondary | ICD-10-CM

## 2011-10-10 DIAGNOSIS — E069 Thyroiditis, unspecified: Secondary | ICD-10-CM

## 2011-10-10 MED ORDER — ZOLPIDEM TARTRATE 10 MG PO TABS: 10.0000 mg | ORAL_TABLET | Freq: Every evening | ORAL | Status: DC | PRN

## 2011-10-10 NOTE — Progress Notes (Signed)
  Subjective:    Patient ID: Elaine Howell, female    DOB: [birth year], 66 y.o.   MRN: [id number]  HPI Patient comes in today for follow up of  multiple medical problems.  BP no change in meds  Losing weight with lsi  Eating drinking and sleeping.  veges and fruit and  Less meat. And more    Limiting  Wine  To 2 per night .    Sometimes still hard.  But feels better   Mood  Ok. Pretty good.  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     Objective:   Physical Exam  Wt Readings from Last 3 Encounters:  10/10/11 143 lb (64.864 kg)  07/10/11 154 lb (69.854 kg)  03/10/11 149 lb (67.586 kg)  WWN in nad  Has uri  HEENT: Normocephalic ;atraumatic , Eyes;  PERRL, EOMs  Full, lids and conjunctiva clear,,Ears: no deformities, canals nl, TM landmarks normal, Nose: no deformity or discharge mild congestion Mouth : OP clear without lesion or edema . Chest:  Clear to A&P without wheezes rales or rhonchi CV:  S1-S2 no gallops or murmurs peripheral perfusion is normal Neck: Supple without adenopathy or masses or bruits palpable thyroid noted NEURO non focal  Oriented x 3. Normal cognition, attention, speech. Not anxious or depressed appearing  More animated today  Good eye contact .     Assessment & Plan:  HT   Some better consider adding ace or other  In future unclear  If has been on other meds in the past  EHR is limited  Sleep  ambien dependent in the  School year aware of risk benefit and  Wean as tolerated asks for mail awa med rx  Diet  Weight better   Continue   ETOH has cut way down  To continue    Encourage to do this   Plan ov in summer  Or as needed Total visit > 50% spent counseling and coordinating care

## 2011-10-10 NOTE — Patient Instructions (Addendum)
Continue lifestyle intervention healthy eating and exercise . And no change in meds for this time Continue healthy weight as it is now . And rov in summer

## 2011-11-17 ENCOUNTER — Ambulatory Visit (INDEPENDENT_AMBULATORY_CARE_PROVIDER_SITE_OTHER): Payer: 59 | Admitting: Licensed Clinical Social Worker

## 2011-11-17 DIAGNOSIS — F331 Major depressive disorder, recurrent, moderate: Secondary | ICD-10-CM

## 2011-11-27 ENCOUNTER — Ambulatory Visit (INDEPENDENT_AMBULATORY_CARE_PROVIDER_SITE_OTHER): Payer: 59 | Admitting: Licensed Clinical Social Worker

## 2011-11-27 DIAGNOSIS — F331 Major depressive disorder, recurrent, moderate: Secondary | ICD-10-CM

## 2011-12-06 ENCOUNTER — Ambulatory Visit (INDEPENDENT_AMBULATORY_CARE_PROVIDER_SITE_OTHER): Payer: 59 | Admitting: Licensed Clinical Social Worker

## 2011-12-06 DIAGNOSIS — F331 Major depressive disorder, recurrent, moderate: Secondary | ICD-10-CM

## 2011-12-07 ENCOUNTER — Ambulatory Visit: Payer: 59 | Admitting: Licensed Clinical Social Worker

## 2011-12-11 ENCOUNTER — Ambulatory Visit (INDEPENDENT_AMBULATORY_CARE_PROVIDER_SITE_OTHER): Payer: 59 | Admitting: Licensed Clinical Social Worker

## 2011-12-11 DIAGNOSIS — F331 Major depressive disorder, recurrent, moderate: Secondary | ICD-10-CM

## 2011-12-21 ENCOUNTER — Ambulatory Visit (INDEPENDENT_AMBULATORY_CARE_PROVIDER_SITE_OTHER): Payer: 59 | Admitting: Licensed Clinical Social Worker

## 2011-12-21 DIAGNOSIS — F331 Major depressive disorder, recurrent, moderate: Secondary | ICD-10-CM

## 2012-01-10 ENCOUNTER — Telehealth: Payer: Self-pay | Admitting: Internal Medicine

## 2012-01-10 NOTE — Telephone Encounter (Signed)
Rx last filled 10/10/11 #90 with 0 rf.  Pt last seen 10/10/11.  Pls advise.

## 2012-01-10 NOTE — Telephone Encounter (Signed)
Pt req to get a written script for zolpidem (AMBIEN) 10 MG tablet. Pt will pick up script when ready so she can mail to [facility]. Pls call when ready for pick up.

## 2012-01-11 MED ORDER — ZOLPIDEM TARTRATE 10 MG PO TABS: 10.0000 mg | ORAL_TABLET | Freq: Every evening | ORAL | Status: DC | PRN

## 2012-01-11 NOTE — Telephone Encounter (Signed)
Ok to refill #90.   

## 2012-01-12 NOTE — Telephone Encounter (Signed)
Left a message for pt to return call 

## 2012-01-15 MED ORDER — ZOLPIDEM TARTRATE 10 MG PO TABS: 10.0000 mg | ORAL_TABLET | Freq: Every evening | ORAL | Status: DC | PRN

## 2012-01-15 NOTE — Telephone Encounter (Signed)
Addended by: Azucena Freed on: 01/15/2012 01:55 PM   Modules accepted: Orders

## 2012-01-15 NOTE — Telephone Encounter (Signed)
Pt picked up rx in office on 01/15/12.

## 2012-03-05 ENCOUNTER — Ambulatory Visit: Payer: 59 | Admitting: Internal Medicine

## 2012-04-02 ENCOUNTER — Encounter: Payer: Self-pay | Admitting: Internal Medicine

## 2012-04-02 ENCOUNTER — Ambulatory Visit (INDEPENDENT_AMBULATORY_CARE_PROVIDER_SITE_OTHER): Payer: Medicare Other | Admitting: Internal Medicine

## 2012-04-02 VITALS — BP 136/78 | HR 42 | Temp 98.3°F | Wt 136.0 lb

## 2012-04-02 DIAGNOSIS — I10 Essential (primary) hypertension: Secondary | ICD-10-CM

## 2012-04-02 DIAGNOSIS — Z789 Other specified health status: Secondary | ICD-10-CM

## 2012-04-02 DIAGNOSIS — G4700 Insomnia, unspecified: Secondary | ICD-10-CM

## 2012-04-02 DIAGNOSIS — F329 Major depressive disorder, single episode, unspecified: Secondary | ICD-10-CM

## 2012-04-02 DIAGNOSIS — Z7289 Other problems related to lifestyle: Secondary | ICD-10-CM

## 2012-04-02 DIAGNOSIS — E069 Thyroiditis, unspecified: Secondary | ICD-10-CM

## 2012-04-02 DIAGNOSIS — F3289 Other specified depressive episodes: Secondary | ICD-10-CM

## 2012-04-02 MED ORDER — ZOLPIDEM TARTRATE 10 MG PO TABS: 10.0000 mg | ORAL_TABLET | Freq: Every evening | ORAL | Status: DC | PRN

## 2012-04-02 NOTE — Progress Notes (Signed)
  Subjective:    Patient ID: Elaine Howell, female    DOB: 1945/11/11, 66 y.o.   MRN: [id number]  HPI Patient comes in for followup of a number of medical issues. At her last visit she's been doing okay during the summer selling her house in [place]. This is a stress she has been exercising very well up until that. No new symptoms. She's taking her blood pressure medicine but not checking her readings. No chest pain shortness of breath she has cut down on the alcohol to 2 a day is trying to go down further. She still takes the Ambien at night. Taking her thyroid medication also. Mood appears to be stable. No significant side effects of medications. Review of Systems Negative for chest pain shortness of breath the constitutional symptoms bruising bleeding unusual.    Past history family history social history reviewed in the electronic medical record. 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 Her ex-husband lives in [place] they're selling the house. Has a personal trainer that helps her with her exercise and has no physical limitations of exercise. An uncle may be terminal dying in [place].  Objective:   Physical Exam BP 136/78  Pulse 42  Temp 98.3 F (36.8 C) (Oral)  Wt 136 lb (61.689 kg)  SpO2 99%  My readings are elevated 140/90 right 140/80 left this is after sitting in the room a while. WDWN in and Neck palpable thyroid no nodule Chest:  Clear to A&P without wheezes rales or rhonchi CV:  S1-S2 no gallops or murmurs peripheral perfusion is normal Abdomen:  Sof,t normal bowel sounds without hepatosplenomegaly, no guarding rebound or masses no CVA tenderness No clubbing cyanosis or edema Looks well today no tremor good eye contact.  Lab Results  Component Value Date   WBC 4.3* 08/07/2011   HGB 12.7 08/07/2011   HCT 37.7 08/07/2011   PLT 312.0 08/07/2011   GLUCOSE 87 07/03/2011   CHOL 289* 07/03/2011   TRIG 57.0 07/03/2011   HDL 109.00 07/03/2011   LDLDIRECT  164.9 07/03/2011   ALT 20 07/03/2011   AST 21 07/03/2011   NA 140 07/03/2011   K 4.1 07/03/2011   CL 106 07/03/2011   CREATININE 0.7 07/03/2011   BUN 14 07/03/2011   CO2 25 07/03/2011   TSH 2.17 07/03/2011       Assessment & Plan:  Hypertension   elevated readings with me may be white coat effect has been okay as far she can tell she has lost weight and exercise. We'll have her check her and readings and contact us if elevated otherwise plan continue followup in the fall when she is due for a checkup.  Sleep and reactive depression. This appears to be stable and she appears to be doing fairly well the current regimen. Cautioned again with use of the Ambien and alcohol. Will refill her medicine at this time because she has been stable on it.  Alcohol use she is cutting back discussed patient aware risk-benefit.  Autoimmune thyroid disease maintaining medication.  Total visit > 50% spent counseling and coordinating care

## 2012-04-02 NOTE — Patient Instructions (Signed)
Continue lifestyle intervention healthy eating and exercise . Limiting etoh No change in meds Check your blood pressure ocassionally.  Goal : below 140/90    Preventive visit in October/ November.  Due for labs at that time.

## 2012-06-28 ENCOUNTER — Encounter: Payer: Self-pay | Admitting: Family

## 2012-06-28 ENCOUNTER — Ambulatory Visit (INDEPENDENT_AMBULATORY_CARE_PROVIDER_SITE_OTHER): Payer: Medicare Other | Admitting: Family

## 2012-06-28 VITALS — BP 120/74 | HR 95 | Temp 98.5°F

## 2012-06-28 DIAGNOSIS — Z23 Encounter for immunization: Secondary | ICD-10-CM

## 2012-06-28 DIAGNOSIS — E039 Hypothyroidism, unspecified: Secondary | ICD-10-CM

## 2012-06-28 DIAGNOSIS — K5900 Constipation, unspecified: Secondary | ICD-10-CM

## 2012-06-28 LAB — CBC WITH DIFFERENTIAL/PLATELET
Basophils Relative: 0.7 % (ref 0.0–3.0)
Eosinophils Absolute: 0 10*3/uL (ref 0.0–0.7)
Eosinophils Relative: 1.1 % (ref 0.0–5.0)
Hemoglobin: 12.9 g/dL (ref 12.0–15.0)
Lymphocytes Relative: 30.6 % (ref 12.0–46.0)
MCHC: 33.6 g/dL (ref 30.0–36.0)
Monocytes Relative: 10.9 % (ref 3.0–12.0)
Neutro Abs: 2.7 10*3/uL (ref 1.4–7.7)
Neutrophils Relative %: 56.7 % (ref 43.0–77.0)
RBC: 4.06 Mil/uL (ref 3.87–5.11)
WBC: 4.7 10*3/uL (ref 4.5–10.5)

## 2012-06-28 NOTE — Progress Notes (Signed)
Subjective:    Patient ID: Elaine Howell, female    DOB: [birth year], 66 y.o.   MRN: [id number]  HPI 66 year old WF is in today with complaints of constipation x1 week. She has been trying over-the-counter and a month, Phillips capsules, and suppositories but no evacuation. Does hear bowel sounds. She has a history of chronic constipation and eats a high-fiber diet but is just not helping. She has a history of hypothyroidism and has not had her thyroid checked in nearly a year. She denies any blood in her stools, dark black stools, abdominal pain, or back pain.   Review of Systems  Constitutional: Negative.   Respiratory: Negative.   Cardiovascular: Negative.   Gastrointestinal: Positive for constipation. Negative for nausea, abdominal pain, blood in stool, anal bleeding and rectal pain.  Genitourinary: Negative.   Musculoskeletal: Negative.   Skin: Negative.   Hematological: Negative.   Psychiatric/Behavioral: Negative.    Past Medical History  Diagnosis Date  . Depression   . Hypertension   . Hyperlipidemia   . Chronic insomnia   . Thyroiditis     positive antibodies    History   Social History  . Marital Status: Married    Spouse Name: N/A    Number of Children: N/A  . Years of Education: N/A   Occupational History  . Not on file.   Social History Main Topics  . Smoking status: Former Games developer  . Smokeless tobacco: Not on file  . Alcohol Use: 8.4 oz/week    14 Glasses of wine per week     heavy alcohol use  . Drug Use: No  . Sexually Active: Not on file   Other Topics Concern  . Not on file   Social History Narrative   [facility] english as a second language 20 hours per week  HH of 2 no petsG2P2Taking 3-4 glasses of wine per day Reg exercise 4 x per week.     No past surgical history on file.  Family History  Problem Relation Age of Onset  . Heart disease Mother   . Alcohol abuse Father   . Heart disease Father   . Hypertension Sister      No Known Allergies  Current Outpatient Prescriptions on File Prior to Visit  Medication Sig Dispense Refill  . amLODipine (NORVASC) 10 MG tablet Take 1 tablet (10 mg total) by mouth daily.  90 tablet  3  . buPROPion (WELLBUTRIN XL) 300 MG 24 hr tablet Take 1 tablet (300 mg total) by mouth every morning.  90 tablet  3  . DULoxetine (CYMBALTA) 60 MG capsule Take 1 capsule (60 mg total) by mouth daily.  90 capsule  3  . levothyroxine (SYNTHROID) 75 MCG tablet Take 1 tablet (75 mcg total) by mouth daily.  90 tablet  3  . zolpidem (AMBIEN) 10 MG tablet Take 1 tablet (10 mg total) by mouth at bedtime as needed for sleep.  90 tablet  1    BP 120/74  Pulse 95  Temp 98.5 F (36.9 C) (Oral)  SpO2 97%chart    Objective:   Physical Exam  Constitutional: She is oriented to person, place, and time. She appears well-developed and well-nourished.  Neck: Normal range of motion. Neck supple.  Cardiovascular: Normal rate, regular rhythm and normal heart sounds.   Pulmonary/Chest: Effort normal and breath sounds normal.  Abdominal: Soft. Bowel sounds are normal. She exhibits no distension. There is no tenderness. There is no rebound and  no guarding.  Neurological: She is alert and oriented to person, place, and time.  Skin: Skin is warm and dry.  Psychiatric: She has a normal mood and affect.      Influenza vaccine: IM x 1    Assessment & Plan:  Assessment: Constipation, hypothyroidism  Plan: Magnesium Citrate 1 bottle by mouth x 1. Maintain with miralax once daily. Drink plenty of fluids. Call the office if symptoms worsen or persist. Recheck as scheduled and as needed sooner.

## 2012-06-28 NOTE — Patient Instructions (Signed)
Magnesium Citrate 1 bottle by mouth as a single dose.    Constipation in Adults Constipation is having fewer than 2 bowel movements per week. Usually, the stools are hard. As we grow older, constipation is more common. If you try to fix constipation with laxatives, the problem may get worse. This is because laxatives taken over a long period of time make the colon muscles weaker. A low-fiber diet, not taking in enough fluids, and taking some medicines may make these problems worse. MEDICATIONS THAT MAY CAUSE CONSTIPATION  Water pills (diuretics).   Calcium channel blockers (used to control blood pressure and for the heart).   Certain pain medicines (narcotics).   Anticholinergics.   Anti-inflammatory agents.   Antacids that contain aluminum.  DISEASES THAT CONTRIBUTE TO CONSTIPATION  Diabetes.   Parkinson's disease.   Dementia.   Stroke.   Depression.   Illnesses that cause problems with salt and water metabolism.  HOME CARE INSTRUCTIONS   Constipation is usually best cared for without medicines. Increasing dietary fiber and eating more fruits and vegetables is the best way to manage constipation.   Slowly increase fiber intake to 25 to 38 grams per day. Whole grains, fruits, vegetables, and legumes are good sources of fiber. A dietitian can further help you incorporate high-fiber foods into your diet.   Drink enough water and fluids to keep your urine clear or pale yellow.   A fiber supplement may be added to your diet if you cannot get enough fiber from foods.   Increasing your activities also helps improve regularity.   Suppositories, as suggested by your caregiver, will also help. If you are using antacids, such as aluminum or calcium containing products, it will be helpful to switch to products containing magnesium if your caregiver says it is okay.   If you have been given a liquid injection (enema) today, this is only a temporary measure. It should not be relied  on for treatment of longstanding (chronic) constipation.   Stronger measures, such as magnesium sulfate, should be avoided if possible. This may cause uncontrollable diarrhea. Using magnesium sulfate may not allow you time to make it to the bathroom.  SEEK IMMEDIATE MEDICAL CARE IF:   There is bright red blood in the stool.   The constipation stays for more than 4 days.   There is belly (abdominal) or rectal pain.   You do not seem to be getting better.   You have any questions or concerns.  MAKE SURE YOU:   Understand these instructions.   Will watch your condition.   Will get help right away if you are not doing well or get worse.  Document Released: 06/16/2004 Document Revised: 09/07/2011 Document Reviewed: 08/22/2011 [facility] Patient Information 2012 [place], [place].

## 2012-07-11 ENCOUNTER — Other Ambulatory Visit: Payer: Self-pay | Admitting: Internal Medicine

## 2012-07-11 ENCOUNTER — Telehealth: Payer: Self-pay | Admitting: Internal Medicine

## 2012-07-11 MED ORDER — LEVOTHYROXINE SODIUM 75 MCG PO TABS: 75.0000 ug | ORAL_TABLET | Freq: Every day | ORAL | Status: DC

## 2012-07-11 NOTE — Telephone Encounter (Signed)
Pt called req refill of levothyroxine (SYNTHROID) 75 MCG to [facility] # [phone]

## 2012-07-11 NOTE — Telephone Encounter (Signed)
Sent by e-scribe. 

## 2012-07-17 ENCOUNTER — Other Ambulatory Visit (INDEPENDENT_AMBULATORY_CARE_PROVIDER_SITE_OTHER): Payer: Medicare Other

## 2012-07-17 DIAGNOSIS — Z79899 Other long term (current) drug therapy: Secondary | ICD-10-CM

## 2012-07-17 DIAGNOSIS — Z0000 Encounter for general adult medical examination without abnormal findings: Secondary | ICD-10-CM

## 2012-07-17 LAB — POCT URINALYSIS DIPSTICK
Blood, UA: NEGATIVE
Nitrite, UA: NEGATIVE
Urobilinogen, UA: 0.2
pH, UA: 5.5

## 2012-07-17 LAB — HEPATIC FUNCTION PANEL
ALT: 22 U/L (ref 0–35)
AST: 22 U/L (ref 0–37)
Albumin: 4.1 g/dL (ref 3.5–5.2)

## 2012-07-17 LAB — CBC WITH DIFFERENTIAL/PLATELET
Basophils Absolute: 0 10*3/uL (ref 0.0–0.1)
Eosinophils Relative: 2.7 % (ref 0.0–5.0)
HCT: 37.1 % (ref 36.0–46.0)
Hemoglobin: 12.2 g/dL (ref 12.0–15.0)
Lymphs Abs: 1.1 10*3/uL (ref 0.7–4.0)
MCV: 96.1 fl (ref 78.0–100.0)
Monocytes Absolute: 0.3 10*3/uL (ref 0.1–1.0)
Monocytes Relative: 10.6 % (ref 3.0–12.0)
Neutro Abs: 1.6 10*3/uL (ref 1.4–7.7)
RDW: 13.2 % (ref 11.5–14.6)

## 2012-07-17 LAB — TSH: TSH: 1.94 u[IU]/mL (ref 0.35–5.50)

## 2012-07-17 LAB — LDL CHOLESTEROL, DIRECT: Direct LDL: 174.3 mg/dL

## 2012-07-17 LAB — BASIC METABOLIC PANEL
BUN: 12 mg/dL (ref 6–23)
Chloride: 108 mEq/L (ref 96–112)
GFR: 99.67 mL/min (ref >60.00)
Glucose, Bld: 87 mg/dL (ref 70–99)
Potassium: 4.2 mEq/L (ref 3.5–5.1)
Sodium: 142 mEq/L (ref 135–145)

## 2012-07-24 ENCOUNTER — Ambulatory Visit (INDEPENDENT_AMBULATORY_CARE_PROVIDER_SITE_OTHER): Payer: Medicare Other | Admitting: Internal Medicine

## 2012-07-24 ENCOUNTER — Encounter: Payer: Self-pay | Admitting: Internal Medicine

## 2012-07-24 VITALS — BP 132/80 | HR 88 | Temp 97.7°F | Ht 63.75 in | Wt 141.0 lb

## 2012-07-24 DIAGNOSIS — F3289 Other specified depressive episodes: Secondary | ICD-10-CM

## 2012-07-24 DIAGNOSIS — G4700 Insomnia, unspecified: Secondary | ICD-10-CM

## 2012-07-24 DIAGNOSIS — Z124 Encounter for screening for malignant neoplasm of cervix: Secondary | ICD-10-CM

## 2012-07-24 DIAGNOSIS — D72819 Decreased white blood cell count, unspecified: Secondary | ICD-10-CM

## 2012-07-24 DIAGNOSIS — K5900 Constipation, unspecified: Secondary | ICD-10-CM

## 2012-07-24 DIAGNOSIS — I10 Essential (primary) hypertension: Secondary | ICD-10-CM

## 2012-07-24 DIAGNOSIS — N819 Female genital prolapse, unspecified: Secondary | ICD-10-CM

## 2012-07-24 DIAGNOSIS — E039 Hypothyroidism, unspecified: Secondary | ICD-10-CM

## 2012-07-24 DIAGNOSIS — F329 Major depressive disorder, single episode, unspecified: Secondary | ICD-10-CM

## 2012-07-24 DIAGNOSIS — E785 Hyperlipidemia, unspecified: Secondary | ICD-10-CM

## 2012-07-24 DIAGNOSIS — Z0000 Encounter for general adult medical examination without abnormal findings: Secondary | ICD-10-CM | POA: Insufficient documentation

## 2012-07-24 MED ORDER — DULOXETINE HCL 60 MG PO CPEP: 60.0000 mg | ORAL_CAPSULE | Freq: Every day | ORAL | Status: DC

## 2012-07-24 MED ORDER — ZOLPIDEM TARTRATE 5 MG PO TABS: 5.0000 mg | ORAL_TABLET | Freq: Every evening | ORAL | Status: DC | PRN

## 2012-07-24 MED ORDER — AMLODIPINE BESYLATE 10 MG PO TABS: 10.0000 mg | ORAL_TABLET | Freq: Every day | ORAL | Status: DC

## 2012-07-24 MED ORDER — LEVOTHYROXINE SODIUM 75 MCG PO TABS: 75.0000 ug | ORAL_TABLET | Freq: Every day | ORAL | Status: DC

## 2012-07-24 NOTE — Patient Instructions (Signed)
Continue healthy lifestyle. Intensify the exercise to help with cholesterol and blood pressure. No change in medications except take 5 mg of Ambien instead of 10. Your exam is fine but you could have some mild uterine prolapse. Someone will contact you about a referral to gastroenterology in regard to your constipation this seems to be unresponsive to routine therapy.  Followup visit with me in 6 months otherwise.  Agree that seeing Darl Pikes again would be helpful.

## 2012-07-24 NOTE — Progress Notes (Signed)
Subjective:    Patient ID: Elaine Howell, female    DOB: 07/25/1946, 66 y.o.   MRN: [id number]  HPI  Patient comes in today for preventive visit and follow-up of medical issues. Update  history since  last visit: Still teaching  Thinking of retiring  THYROID nochange in meds  MOOD  :on cymbalta  Had bout  Of depression but now   [facility]  May go back and talk with Darl Pikes to help with anxiety HT: not checking but taking med no se  ETOH trying  To limit to 2 per evening.  Sleep every night   And tried half  5 mg   And seems  Ok  Problem with constipation very bad and tried colace and   Got cramops supp  And fruit doesn't work  No uti sx  After exercise had episode of ? Prolapse in perineum and pushed back in no pain with this.    Hearing:  Ok   Vision:  No limitations at present .  Safety:  Has smoke detector and wears seat belts.  No firearms. No excess sun exposure. Sees dentist regularly.  Falls: none exercises  Advance directive :  Reviewed    Memory: Felt to be good  , no concern from her or her family.  Depression: No anhedonia unusual crying or depressive symptoms  Nutrition: Eats well balanced diet; adequate calcium and vitamin D. No swallowing chewiing problems.  Injury: no major injuries in the last six months.  Other healthcare providers:  Reviewed today .  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   Preventive parameters: up-to-date on colonoscopy, mammogram, immunizations. Including Tdap and pneumovax.  ADLS:   There are no problems or need for assistance  driving, feeding, obtaining food, dressing, toileting and bathing, managing money using phone. She is independent.  Exercises reg 3-4 time per week.  Ex tobacco    Review of Systems ROS:  GEN/ HEENT: No fever, significant weight changes sweats headaches vision problems hearing changes, CV/ PULM; No chest pain shortness of breath cough, syncope,edema  change in exercise  tolerance. GI /GU: No adominal pain, vomiting, change in bowel habits. No blood in the stool. SKIN/HEME: ,no acute skin rashes suspicious lesions or bleeding. No lymphadenopathy, nodules, masses.  NEURO/ PSYCH:  No neurologic signs such as weakness numbnes IMM/ Allergy: No unusual infections.  Allergy .   REST of 12 system review negative except as per HPI Past history family history social history reviewed in the electronic medical record. Med list reivewed     Objective:   Physical Exam BP 146/80  Pulse 88  Temp 97.7 F (36.5 C) (Oral)  Ht 5' 3.75" (1.619 m)  Wt 141 lb (63.957 kg)  BMI 24.39 kg/m2  SpO2 97% Physical Exam: Vital signs reviewed [id number] is a well-developed well-nourished alert cooperative  white female who appears her stated age in no acute distress.  HEENT: normocephalic atraumatic , Eyes: PERRL EOM's full, conjunctiva clear, Nares: paten,t no deformity discharge or tenderness., Ears: no deformity EAC's clear TMs with normal landmarks. Mouth: clear OP, no lesions, edema.  Moist mucous membranes. Dentition in adequate repair. NECK: supple without masses, thyromegaly or bruits. Breast: normal by inspection . No dimpling, discharge, masses, tenderness or discharge . CHEST/PULM:  Clear to auscultation and percussion breath sounds equal no wheeze , rales or rhonchi. No chest wall deformities or tenderness. CV: PMI is nondisplaced, S1 S2 no gallops, murmurs, rubs. Peripheral  pulses are full without delay.No JVD . Repeat bp right 132/82 ABDOMEN: Bowel sounds normal nontender  No guard or rebound, no hepato splenomegal no CVA tenderness.  No hernia. Extremtities:  No clubbing cyanosis or edema, no acute joint swelling or redness no focal atrophy NEURO:  Oriented x3, cranial nerves 3-12 appear to be intact, no obvious focal weakness,gait within normal limits no abnormal reflexes or asymmetrical SKIN: No acute rashes normal turgor, color, no bruising or petechiae. PSYCH:  Oriented, good eye contact, no obvious depression anxiety, cognition and judgment appear normal. LN: no cervical axillary inguinal adenopathy Pelvic: NL ext GU, labia clear without lesions or rash . Vagina no lesions .Cervix: clear  UTERUS: Neg CMT Adnexa:  clear no masses .  No ob prolapse but urethra is low declined rectal exam    Lab Results  Component Value Date   WBC 3.1* 07/17/2012   HGB 12.2 07/17/2012   HCT 37.1 07/17/2012   PLT 346.0 07/17/2012   GLUCOSE 87 07/17/2012   CHOL 273* 07/17/2012   TRIG 71.0 07/17/2012   HDL 103.80 07/17/2012   LDLDIRECT 174.3 07/17/2012   ALT 22 07/17/2012   AST 22 07/17/2012   NA 142 07/17/2012   K 4.2 07/17/2012   CL 108 07/17/2012   CREATININE 0.6 07/17/2012   BUN 12 07/17/2012   CO2 28 07/17/2012   TSH 1.94 07/17/2012        Assessment & Plan:  Preventive Health Care Counseled regarding healthy nutrition, exercise, sleep, injury prevention, calcium vit d and healthy weight . Had flu vaccine Ht  Some up.  Better on recheck Thyroid  No change  Mood  Doing ok LIPIDS high hdl and ldl  Exercise  Intensify.   Poss pelvic organ  prolapse byt hx ? [facility] referral   Constipation more severe than usual  Ok to use stool softener . Plan Gi referral  Prefers female? If pelvic floor problem also cymbalta can contribute Sleep  Dec ambien to 5 mg  Use with caution. ROV in 6 months or prn.

## 2012-09-23 ENCOUNTER — Other Ambulatory Visit: Payer: Self-pay | Admitting: Internal Medicine

## 2012-12-26 ENCOUNTER — Other Ambulatory Visit: Payer: Self-pay | Admitting: Internal Medicine

## 2013-01-22 ENCOUNTER — Ambulatory Visit (INDEPENDENT_AMBULATORY_CARE_PROVIDER_SITE_OTHER): Payer: Medicare Other | Admitting: Internal Medicine

## 2013-01-22 ENCOUNTER — Encounter: Payer: Self-pay | Admitting: Internal Medicine

## 2013-01-22 VITALS — BP 126/70 | HR 87 | Temp 98.2°F | Wt 150.0 lb

## 2013-01-22 DIAGNOSIS — I10 Essential (primary) hypertension: Secondary | ICD-10-CM

## 2013-01-22 DIAGNOSIS — G4700 Insomnia, unspecified: Secondary | ICD-10-CM

## 2013-01-22 DIAGNOSIS — F329 Major depressive disorder, single episode, unspecified: Secondary | ICD-10-CM

## 2013-01-22 DIAGNOSIS — F3289 Other specified depressive episodes: Secondary | ICD-10-CM

## 2013-01-22 DIAGNOSIS — E039 Hypothyroidism, unspecified: Secondary | ICD-10-CM

## 2013-01-22 MED ORDER — ZOLPIDEM TARTRATE 5 MG PO TABS: 5.0000 mg | ORAL_TABLET | Freq: Every evening | ORAL | Status: DC | PRN

## 2013-01-22 NOTE — Progress Notes (Signed)
Chief Complaint  Patient presents with  . Follow-up  . Hypertension    meds   . Hypothyroidism    HPI: Patient comes in today for follow up of  multiple medical problems.   Decreasing   ambien 5 m  an to try going down.  On this dose. 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  Now recently on generic cymbalta  Gave up etoh for lent was hard but did this.   Exercising every day gym and walking  ROS: See pertinent positives and negatives per HPI. Neg cp sob bleeding  New sx syncope   Past Medical History  Diagnosis Date  . Depression   . Hypertension   . Hyperlipidemia   . Chronic insomnia   . Thyroiditis     positive antibodies  . Heavy alcohol use 07/27/2010    Qualifier: Diagnosis of  By: Fabian Sharp MD, Neta Mends Daily use       Family History  Problem Relation Age of Onset  . Heart disease Mother   . Alcohol abuse Father   . Heart disease Father   . Hypertension Sister     History   Social History  . Marital Status: Married    Spouse Name: N/A    Number of Children: N/A  . Years of Education: N/A   Social History Main Topics  . Smoking status: Former Smoker    Start date: 07/24/1964    Quit date: 07/25/1999  . Smokeless tobacco: None  . Alcohol Use: 8.4 oz/week    14 Glasses of wine per week     Comment: heavy alcohol use  . Drug Use: No  . Sexually Active: None   Other Topics Concern  . 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     Outpatient Encounter Prescriptions as of 01/22/2013  Medication Sig Dispense Refill  . amLODipine (NORVASC) 10 MG tablet Take 1 tablet by mouth  daily  90 tablet  0  . buPROPion (WELLBUTRIN XL) 300 MG 24 hr tablet Take 1 tablet by mouth  every morning  90  tablet  0  . CYMBALTA 60 MG capsule Take 1 capsule by mouth  daily  90 capsule  0  . levothyroxine (SYNTHROID, LEVOTHROID) 75 MCG tablet Take 1 tablet by mouth  daily  90 tablet  0  . zolpidem (AMBIEN) 5 MG tablet Take 1 tablet (5 mg total) by mouth at bedtime as needed for sleep.  90 tablet  1  . [DISCONTINUED] zolpidem (AMBIEN) 5 MG tablet Take 1 tablet (5 mg total) by mouth at bedtime as needed for sleep.  90 tablet  1   No facility-administered encounter medications on file as of 01/22/2013.    EXAM:  BP 126/70  Pulse 87  Temp(Src) 98.2 F (36.8 C) (Oral)  Wt 150 lb (68.04 kg)  BMI 25.96 kg/m2  SpO2 97%  Body mass index is 25.96 kg/(m^2). Wt Readings from Last 3 Encounters:  01/22/13 150 lb (68.04 kg)  07/24/12 141 lb (63.957 kg)  04/02/12 136 lb (61.689 kg)    GENERAL: vitals reviewed and listed  above, alert, oriented, appears well hydrated and in no acute distress  HEENT: atraumatic, conjunctiva  clear, no obvious abnormalities on inspection of external nose and ears OP : no lesion edema or exudate   NECK: no obvious masses on inspection palpation  thyroid no nodule  LUNGS: clear to auscultation bilaterally, no wheezes, rales or rhonchi, good air movement  CV: HRRR, no clubbing cyanosis or  peripheral edema nl cap refill  Abdomen:  Sof,t normal bowel sounds without hepatosplenomegaly, no guarding rebound or masses no CVA tenderness  MS: moves all extremities without noticeable focal  abnormality  PSYCH: pleasant and cooperative, no obvious depression or anxiety Lab Results  Component Value Date   WBC 3.1* 07/17/2012   HGB 12.2 07/17/2012   HCT 37.1 07/17/2012   PLT 346.0 07/17/2012   GLUCOSE 87 07/17/2012   CHOL 273* 07/17/2012   TRIG 71.0 07/17/2012   HDL 103.80 07/17/2012   LDLDIRECT 174.3 07/17/2012   ALT 22 07/17/2012   AST 22 07/17/2012   NA 142 07/17/2012   K 4.2 07/17/2012   CL 108 07/17/2012   CREATININE 0.6 07/17/2012   BUN 12 07/17/2012   CO2  28 07/17/2012   TSH 1.94 07/17/2012    ASSESSMENT AND PLAN:  Discussed the following assessment and plan:  HYPERTENSION - very fgood today  HYPOTHYROIDISM - continue   DEPRESSION - seems stable on medication continue  on generic ( jusat changed)  INSOMNIA UNSPECIFIED - still on  ambien  may try to wean and i agree with this .  -Patient advised to return or notify health care team  if symptoms worsen or persist or new concerns arise.  Patient Instructions  Continue lifestyle intervention healthy eating and exercise . Remus Loffler can wean as tolerated .    Plan ROV preventive with labs in the fall  About 6 months     Neta Mends. Panosh M.D.

## 2013-01-22 NOTE — Patient Instructions (Signed)
Continue lifestyle intervention healthy eating and exercise . Elaine Howell can wean as tolerated .    Plan ROV preventive with labs in the fall  About 6 months

## 2013-04-13 ENCOUNTER — Other Ambulatory Visit: Payer: Self-pay | Admitting: Internal Medicine

## 2013-04-21 ENCOUNTER — Telehealth: Payer: Self-pay | Admitting: Internal Medicine

## 2013-04-21 MED ORDER — DULOXETINE HCL 60 MG PO CPEP: ORAL_CAPSULE | ORAL | Status: DC

## 2013-04-21 MED ORDER — BUPROPION HCL ER (XL) 300 MG PO TB24: ORAL_TABLET | ORAL | Status: DC

## 2013-04-21 MED ORDER — LEVOTHYROXINE SODIUM 75 MCG PO TABS: ORAL_TABLET | ORAL | Status: DC

## 2013-04-21 MED ORDER — AMLODIPINE BESYLATE 10 MG PO TABS: ORAL_TABLET | ORAL | Status: DC

## 2013-04-21 NOTE — Telephone Encounter (Signed)
Sent to [facility] by e-scribe. 

## 2013-04-21 NOTE — Telephone Encounter (Signed)
Pt states that [facility] keeps telling her that we denied all her refills. However, when I look in the chart, it looks like we approved all the refills on 04/13/13.  Pt at this point is fed up with [facility]. She is requesting all her meds please be sent to:  [facility] on [address]   She only has enough left for two days.

## 2013-04-24 ENCOUNTER — Other Ambulatory Visit: Payer: Self-pay | Admitting: Family Medicine

## 2013-05-30 ENCOUNTER — Telehealth: Payer: Self-pay | Admitting: Internal Medicine

## 2013-05-30 MED ORDER — ZOLPIDEM TARTRATE 5 MG PO TABS: ORAL_TABLET | ORAL | Status: DC

## 2013-05-30 NOTE — Telephone Encounter (Signed)
Patient Information:  Caller Name: Karn  Phone: [phone]  Patient: Elaine Howell, Elaine Howell  Gender: Female  DOB: 30-Apr-1946  Age: 67 Years  PCP: Berniece Andreas (Family Practice)  Office Follow Up:  Does the office need to follow up with this patient?: Yes  Instructions For The Office: Rx assistance  RN Note:  Patient would like a new script for Ambien at it Lowest Dosage. Patient trying to wean herself off.  Please contact/  Symptoms  Reason For Call & Symptoms: Patient states she has been trying to wean herself off of her Ambien.  She was able to go 3-4 days with out the medicaiton  but she did not sleep.  She was taking 5mg  and cut them in half to 2.5mg  . She states she is sleeping with 2.5mg  but would like to try to go even lower..  She would like a script for 2.5mg  or the lowest dose . She is requesting a new script to [facility].  Drinks 2 glasses of Wine at night.  Reviewed Health History In EMR: Yes  Reviewed Medications In EMR: Yes  Reviewed Allergies In EMR: Yes  Reviewed Surgeries / Procedures: Yes  Date of Onset of Symptoms: 05/30/2013  Guideline(s) Used:  Insomnia  Disposition Per Guideline:   See Within 2 Weeks in Office  Reason For Disposition Reached:   Insomnia is an ongoing problem (> 2 weeks)  Advice Given:  Tips For Good Sleep:  Use your bed only for sleeping and sex. Do not use your bed for eating, reading or watching TV.  Regular exercise is good for your body and helps you sleep. Do not exercise right before bedtime.  Drink a small glass of warm milk at bedtime.  Take a warm bath or shower before bedtime.  Try to go to bed at the same time each night. More importantly, get up at the same time each morning (don't sleep in).  Tips For Good Sleep - Your Bedroom:  Keep bedroom temperature cool, not warm or cold.  Keep bedroom quiet and dark.  Use a comfortable mattress.  Tips For Good Sleep - When You Can  If you can't fall asleep after 30 minutes,  get out of bed and do something relaxing.  Read a book or listen to some soothing music.  When you feel sleepy, go back to bed.  Repeat these steps as needed.  Tips For Good Sleep - What To Avoid:  Avoid caffeine within 6 hours of bedtime.  Avoid alcohol within 4 hours of bedtime.  Avoid nicotine within 2 hours of bedtime (e.g., cigarettes).  Avoid heavy meals just before bedtime. Eating a light snack is OK.  Call Back If:  Insomnia symptoms persist over 2 weeks  You become worse.  RN Overrode Recommendation:  Patient Requests Prescription  Request new script for lowest dosage ambien

## 2013-05-30 NOTE — Telephone Encounter (Signed)
Spoke to the pt.  Informed her that the lowest dose of Ambien was called in to the pharmacy for her this morning.  She will go to pick it up.

## 2013-05-30 NOTE — Telephone Encounter (Signed)
Called and left on voicemail. 

## 2013-05-30 NOTE — Telephone Encounter (Signed)
PT is calling to request 15 days worth of zolpidem (AMBIEN) 2.5 MG tablet. She states that she is trying come off this medication. Pt would like this sent to [facility] on [address]. Please assist.

## 2013-05-30 NOTE — Telephone Encounter (Signed)
RN attempted to call patient, left a vm.  °

## 2013-07-21 ENCOUNTER — Other Ambulatory Visit: Payer: Self-pay | Admitting: Internal Medicine

## 2013-07-21 ENCOUNTER — Other Ambulatory Visit: Payer: Medicare Other

## 2013-07-21 NOTE — Telephone Encounter (Signed)
No labs since 07/17/12.  Please advise.  Thanks!

## 2013-07-23 ENCOUNTER — Other Ambulatory Visit: Payer: Self-pay | Admitting: Family Medicine

## 2013-07-23 MED ORDER — LEVOTHYROXINE SODIUM 75 MCG PO TABS: ORAL_TABLET | ORAL | Status: DC

## 2013-07-28 ENCOUNTER — Encounter: Payer: Medicare Other | Admitting: Internal Medicine

## 2013-08-21 ENCOUNTER — Ambulatory Visit (INDEPENDENT_AMBULATORY_CARE_PROVIDER_SITE_OTHER): Payer: Medicare Other

## 2013-08-21 DIAGNOSIS — Z23 Encounter for immunization: Secondary | ICD-10-CM

## 2013-09-02 ENCOUNTER — Other Ambulatory Visit: Payer: Self-pay | Admitting: Internal Medicine

## 2013-09-16 ENCOUNTER — Encounter: Payer: Self-pay | Admitting: *Deleted

## 2013-09-17 ENCOUNTER — Other Ambulatory Visit (INDEPENDENT_AMBULATORY_CARE_PROVIDER_SITE_OTHER): Payer: Medicare Other

## 2013-09-17 DIAGNOSIS — Z0000 Encounter for general adult medical examination without abnormal findings: Secondary | ICD-10-CM

## 2013-09-17 DIAGNOSIS — E785 Hyperlipidemia, unspecified: Secondary | ICD-10-CM

## 2013-09-17 DIAGNOSIS — E039 Hypothyroidism, unspecified: Secondary | ICD-10-CM

## 2013-09-17 DIAGNOSIS — Z3A01 Less than 8 weeks gestation of pregnancy: Secondary | ICD-10-CM

## 2013-09-17 LAB — BASIC METABOLIC PANEL
CO2: 26 mEq/L (ref 19–32)
Calcium: 9.7 mg/dL (ref 8.4–10.5)
Creatinine, Ser: 0.6 mg/dL (ref 0.4–1.2)
Glucose, Bld: 106 mg/dL — ABNORMAL HIGH (ref 70–99)
Potassium: 3.6 mEq/L (ref 3.5–5.1)
Sodium: 140 mEq/L (ref 135–145)

## 2013-09-17 LAB — CBC WITH DIFFERENTIAL/PLATELET
Basophils Absolute: 0 10*3/uL (ref 0.0–0.1)
Basophils Relative: 0.6 % (ref 0.0–3.0)
Eosinophils Absolute: 0.1 10*3/uL (ref 0.0–0.7)
Hemoglobin: 13.4 g/dL (ref 12.0–15.0)
Lymphocytes Relative: 33.9 % (ref 12.0–46.0)
Lymphs Abs: 1.2 10*3/uL (ref 0.7–4.0)
MCHC: 34.3 g/dL (ref 30.0–36.0)
MCV: 92.3 fl (ref 78.0–100.0)
Monocytes Absolute: 0.4 10*3/uL (ref 0.1–1.0)
Neutrophils Relative %: 51.6 % (ref 43.0–77.0)
Platelets: 333 10*3/uL (ref 150.0–400.0)
RDW: 13.5 % (ref 11.5–14.6)

## 2013-09-17 LAB — HEPATIC FUNCTION PANEL
ALT: 36 U/L — ABNORMAL HIGH (ref 0–35)
AST: 36 U/L (ref 0–37)
Albumin: 4.6 g/dL (ref 3.5–5.2)
Alkaline Phosphatase: 70 U/L (ref 39–117)
Bilirubin, Direct: 0.1 mg/dL (ref 0.0–0.3)
Total Bilirubin: 0.7 mg/dL (ref 0.3–1.2)

## 2013-09-17 LAB — LIPID PANEL
Cholesterol: 284 mg/dL — ABNORMAL HIGH (ref 0–200)
HDL: 125.7 mg/dL (ref >39.00)
Triglycerides: 67 mg/dL (ref 0.0–149.0)

## 2013-09-17 LAB — TSH: TSH: 4.76 u[IU]/mL (ref 0.35–5.50)

## 2013-09-17 LAB — LDL CHOLESTEROL, DIRECT: Direct LDL: 146.3 mg/dL

## 2013-09-24 ENCOUNTER — Ambulatory Visit (INDEPENDENT_AMBULATORY_CARE_PROVIDER_SITE_OTHER): Payer: Medicare Other | Admitting: Internal Medicine

## 2013-09-24 ENCOUNTER — Encounter: Payer: Self-pay | Admitting: Internal Medicine

## 2013-09-24 VITALS — BP 130/82 | HR 80 | Temp 98.2°F | Resp 16 | Ht 63.75 in | Wt 155.0 lb

## 2013-09-24 DIAGNOSIS — E039 Hypothyroidism, unspecified: Secondary | ICD-10-CM

## 2013-09-24 DIAGNOSIS — I10 Essential (primary) hypertension: Secondary | ICD-10-CM

## 2013-09-24 DIAGNOSIS — G4700 Insomnia, unspecified: Secondary | ICD-10-CM

## 2013-09-24 DIAGNOSIS — E785 Hyperlipidemia, unspecified: Secondary | ICD-10-CM

## 2013-09-24 DIAGNOSIS — R945 Abnormal results of liver function studies: Secondary | ICD-10-CM

## 2013-09-24 DIAGNOSIS — Z0000 Encounter for general adult medical examination without abnormal findings: Secondary | ICD-10-CM

## 2013-09-24 MED ORDER — ZOLPIDEM TARTRATE 5 MG PO TABS: ORAL_TABLET | ORAL | Status: DC

## 2013-09-24 NOTE — Patient Instructions (Addendum)
Continue healthy lifestyle. Your cholesterol is better Limit alcohol to one beverage day Ambien only as needed and not with alcohol. Recheck thyroid test and liver test in about 2-3 months. These are good don't need office visit   Get a mammogram.

## 2013-09-24 NOTE — Progress Notes (Signed)
Chief Complaint  Patient presents with  . Annual Exam    HPI: Patient comes in today for Preventive Medicare wellness visit . No major injuries, ed visits ,hospitalizations , new medications since last visit. She's continued retired regular exercise with a trainer taking thyroid medicine on a regular basis no missed pills it is generic. Thinks that her depression is under control medication continuing. Like to remain on same medication. High blood pressure medication not taking readings recently has been okay. Is due for mammogram haven't made that appointment yet. Has gotten off of the Ambien although would like a few when she travels she is going to [place] for the holiday.   Health Maintenance  Topic Date Due  . Mammogram  01/01/2011  . Tetanus/tdap  10/02/2013  . Influenza Vaccine  05/02/2014  . Colonoscopy  10/02/2014  . Pneumococcal Polysaccharide Vaccine Age 42 And Over  Completed  . Zostavax  Completed   Health Maintenance Review    Hearing: ok  Vision:  No limitations at present . Last eye check UTD  Safety:  Has smoke detector and wears seat belts.  No firearms. No excess sun exposure. Sees dentist regularly.  Falls: no  Advance directive :  Reviewed   Memory: Felt to be good  , no concern from her or her family.  Depression: No anhedonia unusual crying or depressive symptoms  Nutrition: Eats well balanced diet; adequate calcium and vitamin D. No swallowing chewing problems.  Injury: no major injuries in the last six months.  Other healthcare providers:  Reviewed today .  Social:  Lives with spouse married. No pets.   Preventive parameters: up-to-date  Reviewed   ADLS:   There are no problems or need for assistance  driving, feeding, obtaining food, dressing, toileting and bathing, managing money using phone. She is independent.  EXERCISE/ HABITS  Per week regular exercise with trainer every day  No tobacco    etoh drinking wine anymore one cocktail at  night. Working on it.   ROS:  GEN/ HEENT: No fever, significant weight changes sweats headaches vision problems hearing changes, CV/ PULM; No chest pain shortness of breath cough, syncope,edema  change in exercise tolerance. GI /GU: No adominal pain, vomiting, change in bowel habits. No blood in the stool. No significant GU symptoms. SKIN/HEME: ,no acute skin rashes suspicious lesions or bleeding. No lymphadenopathy, nodules, masses.  NEURO/ PSYCH:  No neurologic signs such as weakness numbness. No depression anxiety. IMM/ Allergy: No unusual infections.  Allergy .   REST of 12 system review negative except as per HPI   Past Medical History  Diagnosis Date  . Depression   . Hypertension   . Hyperlipidemia   . Chronic insomnia   . Thyroiditis     positive antibodies  . Heavy alcohol use 07/27/2010    Qualifier: Diagnosis of  By: Fabian Sharp MD, Neta Mends Daily use       Family History  Problem Relation Age of Onset  . Heart disease Mother   . Alcohol abuse Father   . Heart disease Father   . Hypertension Sister    sister history of finger turning black possible related to her vascular problem.  History   Social History  . Marital Status: Married    Spouse Name: N/A    Number of Children: N/A  . Years of Education: N/A   Social History Main Topics  . Smoking status: Former Smoker    Start date: 07/24/1964    Quit date:  07/25/1999  . Smokeless tobacco: None  . Alcohol Use: 8.4 oz/week    14 Glasses of wine per week     Comment: heavy alcohol use  . Drug Use: No  . Sexual Activity: None   Other Topics Concern  . 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   Reg exercise 6-7 x per week.     Outpatient Encounter Prescriptions as of 09/24/2013  Medication Sig  . amLODipine (NORVASC) 10 MG tablet TAKE 1 TABLET BY MOUTH DAILY  . buPROPion  (WELLBUTRIN XL) 300 MG 24 hr tablet TAKE 1 TABLET BY MOUTH EVERY MORNING  . DULoxetine (CYMBALTA) 60 MG capsule TAKE 1 CAPSULE BY MOUTH DAILY  . levothyroxine (SYNTHROID, LEVOTHROID) 75 MCG tablet Take 1 tablet by mouth  daily  . zolpidem (AMBIEN) 5 MG tablet Take half tab by mouth at bedtime as needed for sleep  . [DISCONTINUED] zolpidem (AMBIEN) 5 MG tablet Take half tab by mouth at bedtime as needed for sleep    EXAM:  BP 130/82  Pulse 80  Temp(Src) 98.2 F (36.8 C)  Resp 16  Ht 5' 3.75" (1.619 m)  Wt 155 lb (70.308 kg)  BMI 26.82 kg/m2  Body mass index is 26.82 kg/(m^2).  Physical Exam: Vital signs reviewed [id number]:WRUE is a well-developed well-nourished alert cooperative   who appears stated age in no acute distress.  HEENT: normocephalic atraumatic , Eyes: PERRL EOM's full, conjunctiva clear, Nares: paten,t no deformity discharge or tenderness., Ears: no deformity EAC's clear TMs with normal landmarks. Mouth: clear OP, no lesions, edema.  Moist mucous membranes. Dentition in adequate repair. NECK: supple without masses, thyromegaly or bruits. CHEST/PULM:  Clear to auscultation and percussion breath sounds equal no wheeze , rales or rhonchi. No chest wall deformities or tenderness. CV: PMI is nondisplaced, S1 S2 no gallops, murmurs, rubs. Peripheral pulses are full without delay.No JVD .  Breast: normal by inspection . No dimpling, discharge, masses, tenderness or discharge .  ABDOMEN: Bowel sounds normal nontender  No guard or rebound, no hepato splenomegal no CVA tenderness.  No hernia. Extremtities:  No clubbing cyanosis or edema, no acute joint swelling or redness no focal atrophy NEURO:  Oriented x3, cranial nerves 3-12 appear to be intact, no obvious focal weakness,gait within normal limits no abnormal reflexes or asymmetrical SKIN: No acute rashes normal turgor, color, no bruising or petechiae. PSYCH: Oriented, good eye contact, no obvious depression anxiety, cognition and  judgment appear normal. LN: no cervical axillary inguinal adenopathy No noted deficits in memory, attention, and speech.   Lab Results  Component Value Date   WBC 3.5* 09/17/2013   HGB 13.4 09/17/2013   HCT 39.0 09/17/2013   PLT 333.0 09/17/2013   GLUCOSE 106* 09/17/2013   CHOL 284* 09/17/2013   TRIG 67.0 09/17/2013   HDL 125.70 09/17/2013   LDLDIRECT 146.3 09/17/2013   ALT 36* 09/17/2013   AST 36 09/17/2013   NA 140 09/17/2013   K 3.6 09/17/2013   CL 106 09/17/2013   CREATININE 0.6 09/17/2013   BUN 7 09/17/2013   CO2 26 09/17/2013   TSH 4.76 09/17/2013    ASSESSMENT AND PLAN:  Discussed the following assessment and plan:  Medicare annual wellness visit, subsequent  HYPOTHYROIDISM - TSH high side normal recheck level in  about 3-4 months if okay yearly.  HYPERTENSION - Slightly up on second reading monitor at home let us know if elevated  HYPERLIPIDEMIA - DL actually improved HDL very high continued lifestyle intervention  INSOMNIA UNSPECIFIED - Agree with weaning off the Ambien ; small amount of her travel prescription given today  LIVER FUNCTION TESTS, ABNORMAL - Borderline remote history of same negative serologies in past recheck in 3-4 months. Patient Care Team: Madelin Headings, MD as PCP - General Loraine Leriche T. Nile Riggs, MD as Attending Physician (Ophthalmology) Robley Fries, MD as Attending Physician (Obstetrics and Gynecology)  Patient Instructions  Continue healthy lifestyle. Your cholesterol is better Limit alcohol to one beverage day Ambien only as needed and not with alcohol. Recheck thyroid test and liver test in about 2-3 months. These are good don't need office visit   Get a mammogram.    Neta Mends. Jeremiyah Cullens M.D.

## 2013-09-24 NOTE — Progress Notes (Signed)
Pre visit review using our clinic review tool, if applicable. No additional management support is needed unless otherwise documented below in the visit note. 

## 2013-10-02 HISTORY — PX: CATARACT EXTRACTION, BILATERAL: SHX1313

## 2013-10-21 ENCOUNTER — Other Ambulatory Visit: Payer: Self-pay | Admitting: Internal Medicine

## 2013-12-10 ENCOUNTER — Other Ambulatory Visit (INDEPENDENT_AMBULATORY_CARE_PROVIDER_SITE_OTHER): Payer: Medicare Other

## 2013-12-10 ENCOUNTER — Other Ambulatory Visit: Payer: Medicare Other

## 2013-12-10 DIAGNOSIS — E039 Hypothyroidism, unspecified: Secondary | ICD-10-CM

## 2013-12-10 DIAGNOSIS — I10 Essential (primary) hypertension: Secondary | ICD-10-CM

## 2013-12-10 LAB — HEPATIC FUNCTION PANEL
ALT: 25 U/L (ref 0–35)
AST: 25 U/L (ref 0–37)
Albumin: 4.3 g/dL (ref 3.5–5.2)
Alkaline Phosphatase: 65 U/L (ref 39–117)
BILIRUBIN TOTAL: 0.5 mg/dL (ref 0.3–1.2)
Bilirubin, Direct: 0 mg/dL (ref 0.0–0.3)
Total Protein: 6.7 g/dL (ref 6.0–8.3)

## 2013-12-10 LAB — TSH: TSH: 1.44 u[IU]/mL (ref 0.35–5.50)

## 2013-12-12 ENCOUNTER — Encounter: Payer: Self-pay | Admitting: Family Medicine

## 2013-12-29 ENCOUNTER — Other Ambulatory Visit: Payer: Self-pay | Admitting: Internal Medicine

## 2014-01-05 ENCOUNTER — Encounter: Payer: Self-pay | Admitting: Internal Medicine

## 2014-01-23 ENCOUNTER — Ambulatory Visit (INDEPENDENT_AMBULATORY_CARE_PROVIDER_SITE_OTHER): Payer: Medicare Other | Admitting: Internal Medicine

## 2014-01-23 ENCOUNTER — Encounter: Payer: Self-pay | Admitting: Internal Medicine

## 2014-01-23 ENCOUNTER — Telehealth: Payer: Self-pay | Admitting: Internal Medicine

## 2014-01-23 VITALS — BP 128/80 | HR 87 | Temp 98.0°F | Ht 63.75 in | Wt 155.0 lb

## 2014-01-23 DIAGNOSIS — G4700 Insomnia, unspecified: Secondary | ICD-10-CM

## 2014-01-23 DIAGNOSIS — R112 Nausea with vomiting, unspecified: Secondary | ICD-10-CM

## 2014-01-23 DIAGNOSIS — R1013 Epigastric pain: Secondary | ICD-10-CM

## 2014-01-23 DIAGNOSIS — R52 Pain, unspecified: Secondary | ICD-10-CM

## 2014-01-23 LAB — CBC WITH DIFFERENTIAL/PLATELET
BASOS PCT: 0.5 % (ref 0.0–3.0)
Basophils Absolute: 0 10*3/uL (ref 0.0–0.1)
Eosinophils Absolute: 0 10*3/uL (ref 0.0–0.7)
Eosinophils Relative: 0.6 % (ref 0.0–5.0)
HCT: 41.6 % (ref 36.0–46.0)
HEMOGLOBIN: 14 g/dL (ref 12.0–15.0)
LYMPHS PCT: 21.4 % (ref 12.0–46.0)
Lymphs Abs: 1.1 10*3/uL (ref 0.7–4.0)
MCHC: 33.7 g/dL (ref 30.0–36.0)
MCV: 93.6 fl (ref 78.0–100.0)
MONOS PCT: 8.3 % (ref 3.0–12.0)
Monocytes Absolute: 0.4 10*3/uL (ref 0.1–1.0)
NEUTROS ABS: 3.7 10*3/uL (ref 1.4–7.7)
Neutrophils Relative %: 69.2 % (ref 43.0–77.0)
Platelets: 404 10*3/uL — ABNORMAL HIGH (ref 150.0–400.0)
RBC: 4.45 Mil/uL (ref 3.87–5.11)
RDW: 14.1 % (ref 11.5–14.6)
WBC: 5.4 10*3/uL (ref 4.5–10.5)

## 2014-01-23 MED ORDER — ZOLPIDEM TARTRATE 5 MG PO TABS: ORAL_TABLET | ORAL | Status: DC

## 2014-01-23 MED ORDER — ONDANSETRON 4 MG PO TBDP: 4.0000 mg | ORAL_TABLET | Freq: Three times a day (TID) | ORAL | Status: DC | PRN

## 2014-01-23 MED ORDER — PROMETHAZINE HCL 25 MG/ML IJ SOLN
25.0000 mg | Freq: Once | INTRAMUSCULAR | Status: AC
  Administered 2014-01-23: 25 mg via INTRAMUSCULAR

## 2014-01-23 NOTE — Telephone Encounter (Signed)
Patient Information:  Caller Name: Elaine MuttonGeorgianna  Phone: [phone]  Patient: Elaine Howell, Elaine Howell  Gender: Female  DOB: 03/15/1946  Age: 68 Years  PCP: Berniece AndreasPanosh, Wanda (Family Practice)  Office Follow Up:  Does the office need to follow up with this patient?: No  Instructions For The Office: N/A  RN Note:  Patient calling regarding Nausea, Vomiting, Headache, and abdominal pain.  No vomiting since 2:00am, but now has constant pain in abdomin.  Has not taken medicine x 2 days.  Symptoms  Reason For Call & Symptoms: nausea, vomiting, headache  Reviewed Health History In EMR: Yes  Reviewed Medications In EMR: Yes  Reviewed Allergies In EMR: Yes  Reviewed Surgeries / Procedures: Yes  Date of Onset of Symptoms: 01/22/2014  Guideline(s) Used:  Vomiting  Disposition Per Guideline:   Go to ED Now (or to Office with PCP Approval)  Reason For Disposition Reached:   Constant abdominal pain lasting > 2 hours  Advice Given:  N/A  Patient Will Follow Care Advice:  YES  Appointment Scheduled:  01/23/2014 14:00:00 Appointment Scheduled Provider:  Berniece AndreasPanosh, Wanda (Family Practice)

## 2014-01-23 NOTE — Patient Instructions (Addendum)
Lab to day  This could be a stomach bug gastritis and will get better on its own. Antinausea medication and clear liquids until felling better. Can add ranitidine 150 mg 1  Twice a day  To help with the burning. No alcohol until better .  Seek emergent care if getting worse over the weekend

## 2014-01-23 NOTE — Progress Notes (Signed)
Pre visit review using our clinic review tool, if applicable. No additional management support is needed unless otherwise documented below in the visit note. 

## 2014-01-23 NOTE — Progress Notes (Signed)
Chief Complaint  Patient presents with  . Nausea  . Emesis    HPI: Patient comes in today for SDA for  new problem evaluation. onset wieth nausea  2 days ago and then  Vomiting   Not stopping.   And  Urgent care was closed. Stopped vomiting at 2 am and then took cat naps and then had severe burning  Under rib cage   Some dissipated  Now but still nauseous.   No diarrhea   .  See call a nurse  Note  Sipping water and ice cubes  Urinating ok .  No one else sick had some wine night before but not that much. Has never had something like this and noulcer or gall bladder problems.   ROS: See pertinent positives and negatives per HPI.no cp sob  Would like a few ambien for travel going to [place] with sib in 3 days   Past Medical History  Diagnosis Date  . Depression   . Hypertension   . Hyperlipidemia   . Chronic insomnia   . Thyroiditis     positive antibodies  . Heavy alcohol use 07/27/2010    Qualifier: Diagnosis of  By: Fabian SharpPanosh MD, Neta MendsWanda K Daily use       Family History  Problem Relation Age of Onset  . Heart disease Mother   . Alcohol abuse Father   . Heart disease Father   . Hypertension Sister     History   Social History  . Marital Status: Married    Spouse Name: N/A    Number of Children: N/A  . Years of Education: N/A   Social History Main Topics  . Smoking status: Former Smoker    Start date: 07/24/1964    Quit date: 07/25/1999  . Smokeless tobacco: None  . Alcohol Use: 8.4 oz/week    14 Glasses of wine per week     Comment: heavy alcohol use  . Drug Use: No  . Sexual Activity: None   Other Topics Concern  . None   Social History Narrative   Married   [employer] time [employer] as a second [employer]    Now retired    [facility]   G2P2   Stop drinking wine one cocktail at night.   Reg exercise 6-7 x per week.     Outpatient Encounter Prescriptions as of 01/23/2014  Medication Sig  . amLODipine (NORVASC) 10 MG tablet take 1 tablet by mouth once  daily  . buPROPion (WELLBUTRIN XL) 300 MG 24 hr tablet take 1 tablet by mouth every morning  . DULoxetine (CYMBALTA) 60 MG capsule take 1 capsule by mouth once daily  . levothyroxine (SYNTHROID, LEVOTHROID) 75 MCG tablet take 1 tablet by mouth once daily  . zolpidem (AMBIEN) 5 MG tablet Take half tab by mouth at bedtime as needed for sleep  . [DISCONTINUED] zolpidem (AMBIEN) 5 MG tablet Take half tab by mouth at bedtime as needed for sleep  . ondansetron (ZOFRAN-ODT) 4 MG disintegrating tablet Take 1 tablet (4 mg total) by mouth every 8 (eight) hours as needed for nausea or vomiting.  . [EXPIRED] promethazine (PHENERGAN) injection 25 mg     EXAM:  BP 128/80  Pulse 87  Temp(Src) 98 F (36.7 C) (Oral)  Ht 5' 3.75" (1.619 m)  Wt 155 lb (70.308 kg)  BMI 26.82 kg/m2  SpO2 98%  Body mass index is 26.82 kg/(m^2).  GENERAL: vitals reviewed and listed above, alert, oriented, appears  well hydrated laying in dark room in nad  Mildly ill  But uncomfortable speech nl  HEENT: atraumatic, conjunctiva  clear, no obvious abnormalities on inspection of external nose and ears NECK: no obvious masses on inspection palpation  LUNGS: clear to auscultation bilaterally, no wheezes, rales or rhonchi, good air movement CV: HRRR, no clubbing cyanosis or  peripheral edema nl cap refill  Abdomen:  Sof,t normal bowel sounds to dec without hepatosplenomegaly, no guarding rebound or masses no CVA tenderness points to mid epigastric area as area of discomfort  Skin: normal capillary refill ,turgor , color: No acute rashes ,petechiae or bruising MS: moves all extremities without noticeable focal  abnormality PSYCH: pleasant and cooperative, no obvious depression or anxiety  ASSESSMENT AND PLAN:  Discussed the following assessment and plan:  Nausea with vomiting - Plan: Basic metabolic panel, CBC with Differential, Hepatic function panel, Lipase, promethazine (PHENERGAN) injection 25 mg  Acute epigastric pain -  Plan: Basic metabolic panel, CBC with Differential, Hepatic function panel, Lipase  Insomnia, unspecified - ok to use med short term for travel  Acts like  Acute GE but severity and epigastric burning   Non acut abd at this time     denies's excess etoh at this time but had wine night before .  Fluids phenergan 25  Clear liquids add zantac supportive care . Expectant management. -Patient advised to return or notify health care team  if symptoms worsen ,persist or new concerns arise.  Patient Instructions  Lab to day  This could be a stomach bug gastritis and will get better on its own. Antinausea medication and clear liquids until felling better. Can add ranitidine 150 mg 1  Twice a day  To help with the burning. No alcohol until better .  Seek emergent care if getting worse over the weekend  Shady SideWanda K. King Pinzon M.D.

## 2014-01-23 NOTE — Telephone Encounter (Signed)
Noted  

## 2014-01-26 LAB — HEPATIC FUNCTION PANEL
ALBUMIN: 4.7 g/dL (ref 3.5–5.2)
ALK PHOS: 73 U/L (ref 39–117)
ALT: 24 U/L (ref 0–35)
AST: 32 U/L (ref 0–37)
Bilirubin, Direct: 0.1 mg/dL (ref 0.0–0.3)
TOTAL PROTEIN: 7.7 g/dL (ref 6.0–8.3)
Total Bilirubin: 0.5 mg/dL (ref 0.3–1.2)

## 2014-01-26 LAB — BASIC METABOLIC PANEL
BUN: 13 mg/dL (ref 6–23)
CALCIUM: 11.1 mg/dL — AB (ref 8.4–10.5)
CO2: 24 meq/L (ref 19–32)
Chloride: 104 mEq/L (ref 96–112)
Creatinine, Ser: 0.6 mg/dL (ref 0.4–1.2)
GFR: 99.23 mL/min (ref >60.00)
GLUCOSE: 118 mg/dL — AB (ref 70–99)
POTASSIUM: 3.4 meq/L — AB (ref 3.5–5.1)
Sodium: 141 mEq/L (ref 135–145)

## 2014-01-26 LAB — LIPASE: LIPASE: 18 U/L (ref 11.0–59.0)

## 2014-02-17 NOTE — Addendum Note (Signed)
Addended by: Peter CongoKILLINGSWORTH, Aisea Bouldin T on: 02/17/2014 03:19 PM   Modules accepted: Orders

## 2014-02-24 ENCOUNTER — Other Ambulatory Visit (INDEPENDENT_AMBULATORY_CARE_PROVIDER_SITE_OTHER): Payer: Medicare Other

## 2014-02-24 LAB — CALCIUM: Calcium: 10.2 mg/dL (ref 8.4–10.5)

## 2014-02-26 ENCOUNTER — Encounter: Payer: Self-pay | Admitting: Family Medicine

## 2014-03-26 ENCOUNTER — Ambulatory Visit: Payer: Medicare Other | Admitting: Internal Medicine

## 2014-03-31 ENCOUNTER — Other Ambulatory Visit: Payer: Self-pay | Admitting: Internal Medicine

## 2014-03-31 NOTE — Telephone Encounter (Signed)
Pt has upcoming appt.  Sent to the pharmacy by e-scribe. 

## 2014-04-28 ENCOUNTER — Ambulatory Visit: Payer: Medicare Other | Admitting: Internal Medicine

## 2014-06-30 ENCOUNTER — Telehealth: Payer: Self-pay

## 2014-06-30 NOTE — Telephone Encounter (Signed)
Called patient //LMOVM to call back to schedule her annual CPX for 2015 per Medicare.

## 2014-07-11 ENCOUNTER — Other Ambulatory Visit: Payer: Self-pay | Admitting: Internal Medicine

## 2014-07-13 NOTE — Telephone Encounter (Signed)
#  90 sent to the pharmacy.  Pt is due for CPX in December 2015.  Will send her a letter to get on the schedule.

## 2014-07-31 ENCOUNTER — Telehealth: Payer: Self-pay | Admitting: Internal Medicine

## 2014-07-31 NOTE — Telephone Encounter (Signed)
Unless there is a compelling reason this could be done first week of January also .

## 2014-07-31 NOTE — Telephone Encounter (Signed)
Pt last cpx 09-24-2013 and per pt her ins will pay for cpx one per calendar year. Can I create 30 min slot?

## 2014-07-31 NOTE — Telephone Encounter (Signed)
Pt is trying to get cpx by end of yr

## 2014-08-03 NOTE — Telephone Encounter (Signed)
[facility] Jan [facility]  Thanks!

## 2014-08-04 NOTE — Telephone Encounter (Signed)
Ok per Homer Glenmisty to use Tuesday. Pt has been sch

## 2014-08-06 ENCOUNTER — Ambulatory Visit: Payer: Medicare Other

## 2014-10-04 ENCOUNTER — Other Ambulatory Visit: Payer: Self-pay | Admitting: Internal Medicine

## 2014-10-05 NOTE — Telephone Encounter (Signed)
Sent to the pharmacy for 90 days.  Pt has upcoming CPX on 10/20/13

## 2014-10-12 ENCOUNTER — Emergency Department (HOSPITAL_COMMUNITY)
Admission: EM | Admit: 2014-10-12 | Discharge: 2014-10-12 | Disposition: A | Discharge status: Home / Self Care | Payer: Medicare Other | Attending: Emergency Medicine | Admitting: Emergency Medicine

## 2014-10-12 ENCOUNTER — Telehealth: Payer: Self-pay | Admitting: Internal Medicine

## 2014-10-12 ENCOUNTER — Emergency Department (HOSPITAL_COMMUNITY): Payer: Medicare Other

## 2014-10-12 ENCOUNTER — Encounter (HOSPITAL_COMMUNITY): Payer: Self-pay | Admitting: Emergency Medicine

## 2014-10-12 DIAGNOSIS — S299XXA Unspecified injury of thorax, initial encounter: Secondary | ICD-10-CM | POA: Diagnosis present

## 2014-10-12 DIAGNOSIS — G4700 Insomnia, unspecified: Secondary | ICD-10-CM | POA: Diagnosis not present

## 2014-10-12 DIAGNOSIS — S6992XA Unspecified injury of left wrist, hand and finger(s), initial encounter: Secondary | ICD-10-CM | POA: Diagnosis not present

## 2014-10-12 DIAGNOSIS — I10 Essential (primary) hypertension: Secondary | ICD-10-CM | POA: Insufficient documentation

## 2014-10-12 DIAGNOSIS — Y9389 Activity, other specified: Secondary | ICD-10-CM | POA: Insufficient documentation

## 2014-10-12 DIAGNOSIS — E069 Thyroiditis, unspecified: Secondary | ICD-10-CM | POA: Insufficient documentation

## 2014-10-12 DIAGNOSIS — S20212A Contusion of left front wall of thorax, initial encounter: Secondary | ICD-10-CM | POA: Insufficient documentation

## 2014-10-12 DIAGNOSIS — W19XXXA Unspecified fall, initial encounter: Secondary | ICD-10-CM

## 2014-10-12 DIAGNOSIS — M25532 Pain in left wrist: Secondary | ICD-10-CM | POA: Diagnosis not present

## 2014-10-12 DIAGNOSIS — F329 Major depressive disorder, single episode, unspecified: Secondary | ICD-10-CM | POA: Insufficient documentation

## 2014-10-12 DIAGNOSIS — Z79899 Other long term (current) drug therapy: Secondary | ICD-10-CM | POA: Insufficient documentation

## 2014-10-12 DIAGNOSIS — Z87891 Personal history of nicotine dependence: Secondary | ICD-10-CM | POA: Diagnosis not present

## 2014-10-12 DIAGNOSIS — Y92009 Unspecified place in unspecified non-institutional (private) residence as the place of occurrence of the external cause: Secondary | ICD-10-CM | POA: Insufficient documentation

## 2014-10-12 DIAGNOSIS — R0781 Pleurodynia: Secondary | ICD-10-CM | POA: Diagnosis not present

## 2014-10-12 DIAGNOSIS — Y998 Other external cause status: Secondary | ICD-10-CM | POA: Diagnosis not present

## 2014-10-12 DIAGNOSIS — W01198A Fall on same level from slipping, tripping and stumbling with subsequent striking against other object, initial encounter: Secondary | ICD-10-CM | POA: Insufficient documentation

## 2014-10-12 MED ORDER — IBUPROFEN 800 MG PO TABS: 800.0000 mg | ORAL_TABLET | Freq: Three times a day (TID) | ORAL | Status: DC

## 2014-10-12 MED ORDER — OXYCODONE-ACETAMINOPHEN 5-325 MG PO TABS: 1.0000 | ORAL_TABLET | Freq: Four times a day (QID) | ORAL | Status: DC | PRN

## 2014-10-12 MED ORDER — OXYCODONE-ACETAMINOPHEN 5-325 MG PO TABS
1.0000 | ORAL_TABLET | Freq: Once | ORAL | Status: AC
  Administered 2014-10-12: 1 via ORAL
  Filled 2014-10-12: qty 1

## 2014-10-12 NOTE — ED Notes (Signed)
Pt states she tripped and fell Sunday morning, hit her left rib cage on the rim of the bathtub and caught herself with her left hand, denies head injury, denies LOC. Pt c/o left rib pain, left wrist pain. States she has not taken her blood pressure medications today.

## 2014-10-12 NOTE — ED Provider Notes (Signed)
  Face-to-face evaluation   History: She describes a slip and fall, injuring her left side.  This occurred, yesterday.  No head injury.    Physical exam: Alert, calm, cooperative.  Respiratory - no distress.  Extremities fair range of motion, arms, legs, bilaterally  Medical screening examination/treatment/procedure(s) were conducted as a shared visit with non-physician practitioner(s) and myself.  I personally evaluated the patient during the encounter  Flint MelterElliott L Gwenlyn Hottinger, MD 10/16/14 [phone]

## 2014-10-12 NOTE — ED Provider Notes (Signed)
CSN: [id number]     Arrival date & time 10/12/14  1051 History   First MD Initiated Contact with Patient 10/12/14 1140     Chief Complaint  Patient presents with  . Fall     (Consider location/radiation/quality/duration/timing/severity/associated sxs/prior Treatment) HPI Comments: The patient is a 69 year old female presenting emergency room chief complaint of left anterior rib pain and left wrist pain after fall. Patient reports mechanical fall at home 2 nights ago. Patient reports slipping on bath mat in the dark falling and striking her left sided ribs on bathtub and left wrist. She reports increase in discomfort with deep inspiration. Denies blow to head, LOC, dyspnea. PCP: Lorretta HarpPANOSH,WANDA KOTVAN, MD  Patient is a 69 y.o. female presenting with fall. The history is provided by the patient. No language interpreter was used.  Fall Associated symptoms include arthralgias and chest pain. Pertinent negatives include no abdominal pain or headaches.    Past Medical History  Diagnosis Date  . Depression   . Hypertension   . Hyperlipidemia   . Chronic insomnia   . Thyroiditis     positive antibodies  . Heavy alcohol use 07/27/2010    Qualifier: Diagnosis of  By: Fabian SharpPanosh MD, Neta MendsWanda K Daily use      History reviewed. No pertinent past surgical history. Family History  Problem Relation Age of Onset  . Heart disease Mother   . Alcohol abuse Father   . Heart disease Father   . Hypertension Sister    History  Substance Use Topics  . Smoking status: Former Smoker    Start date: 07/24/1964    Quit date: 07/25/1999  . Smokeless tobacco: Not on file  . Alcohol Use: 8.4 oz/week    14 Glasses of wine per week     Comment: heavy alcohol use   OB History    No data available     Review of Systems  Respiratory: Negative for shortness of breath.   Cardiovascular: Positive for chest pain. Negative for palpitations and leg swelling.  Gastrointestinal: Negative for abdominal pain.    Musculoskeletal: Positive for arthralgias.  Neurological: Negative for syncope and headaches.      Allergies  Review of patient's allergies indicates no known allergies.  Home Medications   Prior to Admission medications   Medication Sig Start Date End Date Taking? Authorizing Provider  amLODipine (NORVASC) 10 MG tablet take 1 tablet by mouth once daily 07/13/14   Madelin HeadingsWanda K Panosh, MD  buPROPion (WELLBUTRIN XL) 300 MG 24 hr tablet take 1 tablet by mouth every morning 07/13/14   Madelin HeadingsWanda K Panosh, MD  DULoxetine (CYMBALTA) 60 MG capsule take 1 capsule by mouth once daily 07/13/14   Madelin HeadingsWanda K Panosh, MD  levothyroxine (SYNTHROID, LEVOTHROID) 75 MCG tablet TAKE 1 TABLET BY MOUTH ONCE DAILY 10/05/14   Madelin HeadingsWanda K Panosh, MD  ondansetron (ZOFRAN-ODT) 4 MG disintegrating tablet Take 1 tablet (4 mg total) by mouth every 8 (eight) hours as needed for nausea or vomiting. 01/23/14   Madelin HeadingsWanda K Panosh, MD  zolpidem (AMBIEN) 5 MG tablet Take half tab by mouth at bedtime as needed for sleep 01/23/14   Madelin HeadingsWanda K Panosh, MD   BP 162/84 mmHg  Pulse 82  Temp(Src) 97.6 F (36.4 C) (Oral)  Resp 18  SpO2 99% Physical Exam  Constitutional: She is oriented to person, place, and time. She appears well-developed and well-nourished. No distress.  HENT:  Head: Normocephalic and atraumatic.  Neck: Neck supple.  Cardiovascular: Normal rate and regular rhythm.  Pulmonary/Chest: Effort normal. She has no wheezes. She has no rales. She exhibits no crepitus.    No crepitus or flail chest equal expansion bilaterally. No ecchymosis, erythema.  Abdominal: Soft. She exhibits no distension. There is no tenderness. There is no rebound.  Musculoskeletal: Normal range of motion.       Left hand: She exhibits bony tenderness. She exhibits normal range of motion, no deformity and no swelling.       Hands: Left: Mild left anatomical snuffbox tenderness with palpation.   Neurological: She is alert and oriented to person, place, and  time.  Skin: Skin is warm. She is not diaphoretic.  Psychiatric: She has a normal mood and affect. Her behavior is normal.  Nursing note and vitals reviewed.   ED Course  Procedures (including critical care time) Labs Review Labs Reviewed - No data to display  Imaging Review Dg Ribs Unilateral W/chest Left  10/12/2014   CLINICAL DATA:  Fall against bath tub Sunday.  Left rib pain.  EXAM: LEFT RIBS AND CHEST - 3+ VIEW  COMPARISON:  None.  FINDINGS: No fracture or other bone lesions are seen involving the ribs. There is no evidence of pneumothorax or pleural effusion. Both lungs are clear. Heart size and mediastinal contours are within normal limits.  IMPRESSION: Negative.   Electronically Signed   By: Charlett Nose M.D.   On: 10/12/2014 12:37   Dg Wrist Complete Left  10/12/2014   CLINICAL DATA:  Fall Sunday against bathtub. Generalized left wrist pain.  EXAM: LEFT WRIST - COMPLETE 3+ VIEW  COMPARISON:  None.  FINDINGS: Advanced degenerative changes in the first carpometacarpal joint. No acute bony abnormality. Specifically, no fracture, subluxation, or dislocation. Soft tissues are intact.  IMPRESSION: No acute bony abnormality.   Electronically Signed   By: Charlett Nose M.D.   On: 10/12/2014 12:35     EKG Interpretation None      MDM   Final diagnoses:  Fall  Rib contusion, left, initial encounter  Wrist pain, left   Patient with recent fall, normal breath sounds bilaterally no obvious crepitus or flail chest.  No abdominal discomfort with palpation. X-ray negative for rib fracture or acute abnormalities. Wrist negative for acute abnormality given patient's snuffbox tenderness we'll put an thumb spica and follow-up. Incentive spirometer given. Discussed imaging results, and treatment plan with the patient. Return precautions given. Reports understanding and no other concerns at this time.  Patient is stable for discharge at this time.  Meds given in ED:  Medications    oxyCODONE-acetaminophen (PERCOCET/ROXICET) 5-325 MG per tablet 1 tablet (1 tablet Oral Given 10/12/14 1237)    Discharge Medication List as of 10/12/2014  2:00 PM    START taking these medications   Details  ibuprofen (ADVIL,MOTRIN) 800 MG tablet Take 1 tablet (800 mg total) by mouth 3 (three) times daily. Take with food, Starting 10/12/2014, Until Discontinued, Print    oxyCODONE-acetaminophen (PERCOCET/ROXICET) 5-325 MG per tablet Take 1 tablet by mouth every 6 (six) hours as needed for moderate pain or severe pain., Starting 10/12/2014, Until Discontinued, Print        Mellody Drown, PA-C 10/12/14 1618  Flint Melter, MD 10/12/14 1725  Flint Melter, MD 10/16/14 [phone]

## 2014-10-12 NOTE — Telephone Encounter (Signed)
Patient Name: Elaine RutherfordGEORGIANNA Howell  DOB: 05/20/1946    Nurse Assessment  Nurse: Elijah Birkaldwell, RN, Lynda Date/Time (Eastern Time): 10/12/2014 8:51:11 AM  Confirm and document reason for call. If symptomatic, describe symptoms. ---Caller states she fell in the bathroom in the middle of the night on Sat., landed on left side ribs, having a lot of pain.  Has the patient traveled out of the country within the last 30 days? ---Not Applicable  Does the patient require triage? ---Yes  Related visit to physician within the last 2 weeks? ---No  Does the PT have any chronic conditions? (i.e. diabetes, asthma, etc.) ---No     Guidelines    Guideline Title Affirmed Question Affirmed Notes  Chest Injury SEVERE chest pain    Final Disposition User   Go to ED Now Elijah Birkaldwell, RN, Stark BrayLynda

## 2014-10-12 NOTE — Discharge Instructions (Signed)
Call an orthopedic specialist for further evaluation of your wrist injury. Call for a follow up appointment with a Family or Primary Care Provider.  Return if Symptoms worsen.   Take medication as prescribed.

## 2014-10-12 NOTE — ED Notes (Signed)
Patient able to inspire to 1000 cc with incentive spirometer. Given specific instructions on use.

## 2014-10-12 NOTE — ED Notes (Signed)
Ortho called 

## 2014-10-13 ENCOUNTER — Other Ambulatory Visit (INDEPENDENT_AMBULATORY_CARE_PROVIDER_SITE_OTHER): Payer: Medicare Other

## 2014-10-13 DIAGNOSIS — E785 Hyperlipidemia, unspecified: Secondary | ICD-10-CM

## 2014-10-13 DIAGNOSIS — I10 Essential (primary) hypertension: Secondary | ICD-10-CM | POA: Diagnosis not present

## 2014-10-13 DIAGNOSIS — E039 Hypothyroidism, unspecified: Secondary | ICD-10-CM | POA: Diagnosis not present

## 2014-10-13 DIAGNOSIS — Z0000 Encounter for general adult medical examination without abnormal findings: Secondary | ICD-10-CM

## 2014-10-13 LAB — CBC WITH DIFFERENTIAL/PLATELET
BASOS ABS: 0 10*3/uL (ref 0.0–0.1)
BASOS PCT: 0.7 % (ref 0.0–3.0)
EOS ABS: 0.2 10*3/uL (ref 0.0–0.7)
Eosinophils Relative: 3.9 % (ref 0.0–5.0)
HCT: 39.6 % (ref 36.0–46.0)
HEMOGLOBIN: 13.2 g/dL (ref 12.0–15.0)
LYMPHS ABS: 1.4 10*3/uL (ref 0.7–4.0)
LYMPHS PCT: 34.9 % (ref 12.0–46.0)
MCHC: 33.3 g/dL (ref 30.0–36.0)
MCV: 94.7 fl (ref 78.0–100.0)
MONO ABS: 0.4 10*3/uL (ref 0.1–1.0)
Monocytes Relative: 9.9 % (ref 3.0–12.0)
NEUTROS PCT: 50.6 % (ref 43.0–77.0)
Neutro Abs: 2.1 10*3/uL (ref 1.4–7.7)
Platelets: 354 10*3/uL (ref 150.0–400.0)
RBC: 4.18 Mil/uL (ref 3.87–5.11)
RDW: 13.8 % (ref 11.5–15.5)
WBC: 4.2 10*3/uL (ref 4.0–10.5)

## 2014-10-13 LAB — COMPREHENSIVE METABOLIC PANEL
ALT: 21 U/L (ref 0–35)
AST: 21 U/L (ref 0–37)
Albumin: 4.3 g/dL (ref 3.5–5.2)
Alkaline Phosphatase: 77 U/L (ref 39–117)
BUN: 12 mg/dL (ref 6–23)
CO2: 27 mEq/L (ref 19–32)
Calcium: 9.3 mg/dL (ref 8.4–10.5)
Chloride: 107 mEq/L (ref 96–112)
Creatinine, Ser: 0.6 mg/dL (ref 0.4–1.2)
GFR: 115.83 mL/min (ref >60.00)
Glucose, Bld: 87 mg/dL (ref 70–99)
Potassium: 4.3 mEq/L (ref 3.5–5.1)
Sodium: 139 mEq/L (ref 135–145)
Total Bilirubin: 0.6 mg/dL (ref 0.2–1.2)
Total Protein: 7.1 g/dL (ref 6.0–8.3)

## 2014-10-13 LAB — LIPID PANEL
Cholesterol: 293 mg/dL — ABNORMAL HIGH (ref 0–200)
HDL: 97.2 mg/dL (ref >39.00)
LDL Cholesterol: 179 mg/dL — ABNORMAL HIGH (ref 0–99)
NONHDL: 195.8
Total CHOL/HDL Ratio: 3
Triglycerides: 85 mg/dL (ref 0.0–149.0)
VLDL: 17 mg/dL (ref 0.0–40.0)

## 2014-10-13 LAB — TSH: TSH: 6.44 u[IU]/mL — ABNORMAL HIGH (ref 0.35–4.50)

## 2014-10-20 ENCOUNTER — Encounter: Payer: Self-pay | Admitting: Internal Medicine

## 2014-10-20 ENCOUNTER — Ambulatory Visit (INDEPENDENT_AMBULATORY_CARE_PROVIDER_SITE_OTHER): Payer: Medicare Other | Admitting: Internal Medicine

## 2014-10-20 VITALS — BP 136/90 | Temp 97.5°F | Ht 63.0 in | Wt 160.8 lb

## 2014-10-20 DIAGNOSIS — Z9181 History of falling: Secondary | ICD-10-CM

## 2014-10-20 DIAGNOSIS — F329 Major depressive disorder, single episode, unspecified: Secondary | ICD-10-CM

## 2014-10-20 DIAGNOSIS — F32A Depression, unspecified: Secondary | ICD-10-CM

## 2014-10-20 DIAGNOSIS — Z0000 Encounter for general adult medical examination without abnormal findings: Secondary | ICD-10-CM

## 2014-10-20 DIAGNOSIS — E785 Hyperlipidemia, unspecified: Secondary | ICD-10-CM

## 2014-10-20 DIAGNOSIS — I10 Essential (primary) hypertension: Secondary | ICD-10-CM

## 2014-10-20 DIAGNOSIS — E039 Hypothyroidism, unspecified: Secondary | ICD-10-CM

## 2014-10-20 DIAGNOSIS — R0789 Other chest pain: Secondary | ICD-10-CM

## 2014-10-20 DIAGNOSIS — Z974 Presence of external hearing-aid: Secondary | ICD-10-CM

## 2014-10-20 DIAGNOSIS — Z23 Encounter for immunization: Secondary | ICD-10-CM

## 2014-10-20 DIAGNOSIS — Z79899 Other long term (current) drug therapy: Secondary | ICD-10-CM

## 2014-10-20 MED ORDER — LEVOTHYROXINE SODIUM 88 MCG PO TABS: 88.0000 ug | ORAL_TABLET | Freq: Every day | ORAL | Status: DC

## 2014-10-20 NOTE — Patient Instructions (Addendum)
Yearly flu vaccine.  Increase thyroid dose  To 88mcg per day Agree dec alcohol exercise as tolerated will help with weight. TSH  In 2- 3 months and ROV  Will check bp at that time  hearlhy eating and weight loss and decrease in alcohol should help your blood pressure   RIB injury may take weeks to get all better  activiy as tolerated is ok . splint and ice packs as needed   You mauy b edue for colonoscopy this year.   Healthy lifestyle includes : At least 150 minutes of exercise weeks  , weight at healthy levels, which is usually   BMI 19-25. Avoid trans fats and processed foods;  Increase fresh fruits and veges to 5 servings per day. And avoid sweet beverages including tea and juice. Mediterranean diet with olive oil and nuts have been noted to be heart and brain healthy . Avoid tobacco products . Limit  alcohol to  7 per week for women and 14 servings for men.  Get adequate sleep . Wear seat belts . Don't text and drive .    Fall Prevention and Home Safety Falls cause injuries and can affect all age groups. It is possible to use preventive measures to significantly decrease the likelihood of falls. There are many simple measures which can make your home safer and prevent falls. OUTDOORS  Repair cracks and edges of walkways and driveways.  Remove high doorway thresholds.  Trim shrubbery on the main path into your home.  Have good outside lighting.  Clear walkways of tools, rocks, debris, and clutter.  Check that handrails are not broken and are securely fastened. Both sides of steps should have handrails.  Have leaves, snow, and ice cleared regularly.  Use sand or salt on walkways during winter months.  In the garage, clean up grease or oil spills. BATHROOM  Install night lights.  Install grab bars by the toilet and in the tub and shower.  Use non-skid mats or decals in the tub or shower.  Place a plastic non-slip stool in the shower to sit on, if needed.  Keep  floors dry and clean up all water on the floor immediately.  Remove soap buildup in the tub or shower on a regular basis.  Secure bath mats with non-slip, double-sided rug tape.  Remove throw rugs and tripping hazards from the floors. BEDROOMS  Install night lights.  Make sure a bedside light is easy to reach.  Do not use oversized bedding.  Keep a telephone by your bedside.  Have a firm chair with side arms to use for getting dressed.  Remove throw rugs and tripping hazards from the floor. KITCHEN  Keep handles on pots and pans turned toward the center of the stove. Use back burners when possible.  Clean up spills quickly and allow time for drying.  Avoid walking on wet floors.  Avoid hot utensils and knives.  Position shelves so they are not too high or low.  Place commonly used objects within easy reach.  If necessary, use a sturdy step stool with a grab bar when reaching.  Keep electrical cables out of the way.  Do not use floor polish or wax that makes floors slippery. If you must use wax, use non-skid floor wax.  Remove throw rugs and tripping hazards from the floor. STAIRWAYS  Never leave objects on stairs.  Place handrails on both sides of stairways and use them. Fix any loose handrails. Make sure handrails on both sides of  the stairways are as long as the stairs.  Check carpeting to make sure it is firmly attached along stairs. Make repairs to worn or loose carpet promptly.  Avoid placing throw rugs at the top or bottom of stairways, or properly secure the rug with carpet tape to prevent slippage. Get rid of throw rugs, if possible.  Have an electrician put in a light switch at the top and bottom of the stairs. OTHER FALL PREVENTION TIPS  Wear low-heel or rubber-soled shoes that are supportive and fit well. Wear closed toe shoes.  When using a stepladder, make sure it is fully opened and both spreaders are firmly locked. Do not climb a closed  stepladder.  Add color or contrast paint or tape to grab bars and handrails in your home. Place contrasting color strips on first and last steps.  Learn and use mobility aids as needed. Install an electrical emergency response system.  Turn on lights to avoid dark areas. Replace light bulbs that burn out immediately. Get light switches that glow.  Arrange furniture to create clear pathways. Keep furniture in the same place.  Firmly attach carpet with non-skid or double-sided tape.  Eliminate uneven floor surfaces.  Select a carpet pattern that does not visually hide the edge of steps.  Be aware of all pets. OTHER HOME SAFETY TIPS  Set the water temperature for 120 F (48.8 C).  Keep emergency numbers on or near the telephone.  Keep smoke detectors on every level of the home and near sleeping areas. Document Released: 09/08/2002 Document Revised: 03/19/2012 Document Reviewed: 12/08/2011 [facility] Patient Information 2015 [place], [place]. This information is not intended to replace advice given to you by your health care provider. Make sure you discuss any questions you have with your health care provider.

## 2014-10-20 NOTE — Progress Notes (Signed)
Pre visit review using our clinic review tool, if applicable. No additional management support is needed unless otherwise documented below in the visit note.  Chief Complaint  Patient presents with  . Medicare Wellness    meds  . Hypothyroidism  . Hypertension    HPI: Patient comes in today for Preventive Medicare wellness visit . adn Chronic disease management  And Fu ED   Hx of fall and bruised ribs chest wall on 1 11 slipping in br bath mat  Given precoded and advil  No med now hurts to deep breath and move certain way  since last visit. Thyroid taking meds every day  BP seems high a lot  No recent exercise  Has hearing aids now Has thickened nails and scaly feet right more than left thinks its fungus using otcs   SEE SCANNED DOCUMENT Health Maintenance  Topic Date Due  . DEXA SCAN  11/03/2008  . MAMMOGRAM  01/01/2011  . COLONOSCOPY  10/02/2014  . INFLUENZA VACCINE  05/03/2015  . TETANUS/TDAP  10/20/2024  . PNEUMOCOCCAL POLYSACCHARIDE VACCINE AGE 39 AND OVER  Completed  . ZOSTAVAX  Completed   Health Maintenance Review LIFESTYLE:  Exercise:  Not right now   Had injury  Tobacco/ETS:no Alcohol: per day  Some relapse some   To do less .    Sugar beverages:   No sig   Sleep:   Ok so far  Drug use: no Bone density:  Colonoscopy:  May be due this year  [facility]    Hearing:  Ok   Vision:  No limitations at present . Last eye check UTD  Safety:  Has smoke detector and wears seat belts.  No firearms. No excess sun exposure. Sees dentist regularly.  Falls: ys see above in dark    Advance directive :  Reviewed   dosen have one.  Memory: Felt to be good  , no concern from her or her family.  Depression: No anhedonia unusual crying or depressive symptoms  Nutrition: Eats well balanced diet; adequate calcium and vitamin D. No swallowing chewing problems.  Injury: no major injuries in the last six months.  Other healthcare providers:  Reviewed today .  Social:  Lives  alone  No pets.   Preventive parameters: up-to-date  Reviewed   ADLS:   There are no problems or need for assistance  driving, feeding, obtaining food, dressing, toileting and bathing, managing money using phone. She is independent.   working  At [employer] 12 - 3     Part time  ROS:  GEN/ HEENT: No fever, significant weight changes sweats headaches vision problems hearing changes, CV/ PULM; No chest pain shortness of breath cough, syncope,edema  change in exercise tolerance. GI /GU: No adominal pain, vomiting, change in bowel habits. No blood in the stool. No significant GU symptoms. SKIN/HEME: ,no acute skin rashes suspicious lesions or bleeding. No lymphadenopathy, nodules, masses.  Thinks needs jublia for feet  NEURO/ PSYCH:  No neurologic signs such as weakness numbness. No depression anxiety. IMM/ Allergy: No unusual infections.  Allergy .   REST of 12 system review negative except as per HPI   Past Medical History  Diagnosis Date  . Depression   . Hypertension   . Hyperlipidemia   . Chronic insomnia   . Thyroiditis     positive antibodies  . Heavy alcohol use 07/27/2010    Qualifier: Diagnosis of  By: Fabian Sharp MD, Neta Mends Daily use       Family History  Problem Relation Age of Onset  . Heart disease Mother   . Alcohol abuse Father   . Heart disease Father   . Hypertension Sister     History   Social History  . Marital Status: Married    Spouse Name: N/A    Number of Children: N/A  . Years of Education: N/A   Social History Main Topics  . Smoking status: Former Smoker    Start date: 07/24/1964    Quit date: 07/25/1999  . Smokeless tobacco: None  . Alcohol Use: 8.4 oz/week    14 Glasses of wine per week     Comment: heavy alcohol use  . Drug Use: No  . Sexual Activity: None   Other Topics Concern  . 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   Reg exercise 6-7 x per week.     Outpatient Encounter Prescriptions as of 10/20/2014  Medication Sig  . amLODipine (NORVASC) 10 MG tablet take 1 tablet by mouth once daily  . buPROPion (WELLBUTRIN XL) 300 MG 24 hr tablet take 1 tablet by mouth every morning  . DULoxetine (CYMBALTA) 60 MG capsule take 1 capsule by mouth once daily  . [DISCONTINUED] levothyroxine (SYNTHROID, LEVOTHROID) 75 MCG tablet TAKE 1 TABLET BY MOUTH ONCE DAILY  . levothyroxine (SYNTHROID, LEVOTHROID) 88 MCG tablet Take 1 tablet (88 mcg total) by mouth daily.  . [DISCONTINUED] ibuprofen (ADVIL,MOTRIN) 800 MG tablet Take 1 tablet (800 mg total) by mouth 3 (three) times daily. Take with food  . [DISCONTINUED] oxyCODONE-acetaminophen (PERCOCET/ROXICET) 5-325 MG per tablet Take 1 tablet by mouth every 6 (six) hours as needed for moderate pain or severe pain.    EXAM:  BP 136/90 mmHg  Temp(Src) 97.5 F (36.4 C) (Oral)  Ht 5\' 3"  (1.6 m)  Wt 160 lb 12.8 oz (72.938 kg)  BMI 28.49 kg/m2  Body mass index is 28.49 kg/(m^2).  Physical Exam: Vital signs reviewed [id number] is a well-developed well-nourished alert cooperative   who appears stated age in no acute distress.  HEENT: normocephalic atraumatic , Eyes: PERRL EOM's full, conjunctiva clear, Nares: paten,t no deformity discharge or tenderness., Ears: no deformity EAC's clear TMs with normal landmarks. Mouth: clear OP, no lesions, edema.  Moist mucous membranes. Dentition in adequate repair. NECK: supple without masses, thyromegaly or bruits. CHEST/PULM:  Clear to auscultation and percussion breath sounds equal no wheeze , rales or rhonchi. No chest wall deformities tender left lower no crepitus  Breast no nodules of discharge  CV: PMI is nondisplaced, S1 S2 no gallops, murmurs, rubs. Peripheral pulses are full without delay.No JVD .  ABDOMEN: Bowel sounds normal nontender  No guard or rebound, no hepato splenomegal no CVA tenderness.  No  hernia. Extremtities:  No clubbing cyanosis or edema, no acute joint swelling or redness no focal atrophy NEURO:  Oriented x3, cranial nerves 3-12 appear to be intact, no obvious focal weakness,gait within normal limits no abnormal reflexes or asymmetrical SKIN: No acute rashes normal turgor, color, no bruising or petechiae. Feet flaking soles and some callus  thickened painted nails  PSYCH: Oriented, good eye contact, no obvious depression anxiety, cognition and judgment appear normal. LN: no cervical axillary inguinal adenopathy No noted deficits in memory, attention, and speech.   Lab Results  Component Value  Date   WBC 4.2 10/13/2014   HGB 13.2 10/13/2014   HCT 39.6 10/13/2014   PLT 354.0 10/13/2014   GLUCOSE 87 10/13/2014   CHOL 293* 10/13/2014   TRIG 85.0 10/13/2014   HDL 97.20 10/13/2014   LDLDIRECT 146.3 09/17/2013   LDLCALC 179* 10/13/2014   ALT 21 10/13/2014   AST 21 10/13/2014   NA 139 10/13/2014   K 4.3 10/13/2014   CL 107 10/13/2014   CREATININE 0.6 10/13/2014   BUN 12 10/13/2014   CO2 27 10/13/2014   TSH 6.44* 10/13/2014   BP Readings from Last 3 Encounters:  10/20/14 136/90  10/12/14 160/80  01/23/14 128/80   Wt Readings from Last 3 Encounters:  10/20/14 160 lb 12.8 oz (72.938 kg)  01/23/14 155 lb (70.308 kg)  09/24/13 155 lb (70.308 kg)      ASSESSMENT AND PLAN:  Discussed the following assessment and plan:  Medicare annual wellness visit, subsequent  Visit for preventive health examination  Hypothyroidism, unspecified hypothyroidism type - taking correctly adjust med  incrase and fu in 2 -3 months  Essential hypertension - not at goal lsi stay on med  rechek in 2-3 months linit etoh  Medication management  Hyperlipidemia - good ratio still high   Hx of fall - rib chest wall contusion counsled   Depression - stay on same meds seems to be doing well   Wears hearing aid  Need for tetanus booster - Plan: Td vaccine greater than or equal  to 7yo preservative free IM  Need for vaccination with 13-polyvalent pneumococcal conjugate vaccine - Plan: Pneumococcal conjugate vaccine 13-valent  Left-sided chest wall pain See derm about feet    Plan dec etoh , change thryoid med  Local care for cocwp and fu in 2-3 months and check bp and thryoid etc Patient Care Team: Madelin Headings, MD as PCP - General Mark T. Nile Riggs, MD as Attending Physician (Ophthalmology) Robley Fries, MD as Attending Physician (Obstetrics and Gynecology)  Patient Instructions  Yearly flu vaccine.  Increase thyroid dose  To per day Agree dec alcohol exercise as tolerated will help with weight. TSH  In 2- 3 months and ROV  Will check bp at that time  hearlhy eating and weight loss and decrease in alcohol should help your blood pressure   RIB injury may take weeks to get all better  activiy as tolerated is ok . splint and ice packs as needed   You mauy b edue for colonoscopy this year.   Healthy lifestyle includes : At least 150 minutes of exercise weeks  , weight at healthy levels, which is usually   BMI 19-25. Avoid trans fats and processed foods;  Increase fresh fruits and veges to 5 servings per day. And avoid sweet beverages including tea and juice. Mediterranean diet with olive oil and nuts have been noted to be heart and brain healthy . Avoid tobacco products . Limit  alcohol to  7 per week for women and 14 servings for men.  Get adequate sleep . Wear seat belts . Don't text and drive .    Fall Prevention and Home Safety Falls cause injuries and can affect all age groups. It is possible to use preventive measures to significantly decrease the likelihood of falls. There are many simple measures which can make your home safer and prevent falls. OUTDOORS  Repair cracks and edges of walkways and driveways.  Remove high doorway thresholds.  Trim shrubbery on the main path into  your home.  Have good outside lighting.  Clear walkways  of tools, rocks, debris, and clutter.  Check that handrails are not broken and are securely fastened. Both sides of steps should have handrails.  Have leaves, snow, and ice cleared regularly.  Use sand or salt on walkways during winter months.  In the garage, clean up grease or oil spills. BATHROOM  Install night lights.  Install grab bars by the toilet and in the tub and shower.  Use non-skid mats or decals in the tub or shower.  Place a plastic non-slip stool in the shower to sit on, if needed.  Keep floors dry and clean up all water on the floor immediately.  Remove soap buildup in the tub or shower on a regular basis.  Secure bath mats with non-slip, double-sided rug tape.  Remove throw rugs and tripping hazards from the floors. BEDROOMS  Install night lights.  Make sure a bedside light is easy to reach.  Do not use oversized bedding.  Keep a telephone by your bedside.  Have a firm chair with side arms to use for getting dressed.  Remove throw rugs and tripping hazards from the floor. KITCHEN  Keep handles on pots and pans turned toward the center of the stove. Use back burners when possible.  Clean up spills quickly and allow time for drying.  Avoid walking on wet floors.  Avoid hot utensils and knives.  Position shelves so they are not too high or low.  Place commonly used objects within easy reach.  If necessary, use a sturdy step stool with a grab bar when reaching.  Keep electrical cables out of the way.  Do not use floor polish or wax that makes floors slippery. If you must use wax, use non-skid floor wax.  Remove throw rugs and tripping hazards from the floor. STAIRWAYS  Never leave objects on stairs.  Place handrails on both sides of stairways and use them. Fix any loose handrails. Make sure handrails on both sides of the stairways are as long as the stairs.  Check carpeting to make sure it is firmly attached along stairs. Make repairs to  worn or loose carpet promptly.  Avoid placing throw rugs at the top or bottom of stairways, or properly secure the rug with carpet tape to prevent slippage. Get rid of throw rugs, if possible.  Have an electrician put in a light switch at the top and bottom of the stairs. OTHER FALL PREVENTION TIPS  Wear low-heel or rubber-soled shoes that are supportive and fit well. Wear closed toe shoes.  When using a stepladder, make sure it is fully opened and both spreaders are firmly locked. Do not climb a closed stepladder.  Add color or contrast paint or tape to grab bars and handrails in your home. Place contrasting color strips on first and last steps.  Learn and use mobility aids as needed. Install an electrical emergency response system.  Turn on lights to avoid dark areas. Replace light bulbs that burn out immediately. Get light switches that glow.  Arrange furniture to create clear pathways. Keep furniture in the same place.  Firmly attach carpet with non-skid or double-sided tape.  Eliminate uneven floor surfaces.  Select a carpet pattern that does not visually hide the edge of steps.  Be aware of all pets. OTHER HOME SAFETY TIPS  Set the water temperature for 120 F (48.8 C).  Keep emergency numbers on or near the telephone.  Keep smoke detectors on every level  of the home and near sleeping areas. Document Released: 09/08/2002 Document Revised: 03/19/2012 Document Reviewed: 12/08/2011 [facility] Patient Information 2015 [place], [place]. This information is not intended to replace advice given to you by your health care provider. Make sure you discuss any questions you have with your health care provider.     Neta Mends. Panosh M.D.

## 2014-10-21 ENCOUNTER — Telehealth: Payer: Self-pay | Admitting: Internal Medicine

## 2014-10-21 NOTE — Telephone Encounter (Signed)
emmi emailed °

## 2014-12-18 ENCOUNTER — Other Ambulatory Visit: Payer: Self-pay | Admitting: Internal Medicine

## 2014-12-21 NOTE — Telephone Encounter (Signed)
Sent to the pharmacy by e-scribe. 

## 2015-01-12 ENCOUNTER — Other Ambulatory Visit (INDEPENDENT_AMBULATORY_CARE_PROVIDER_SITE_OTHER): Payer: Medicare Other

## 2015-01-12 DIAGNOSIS — E039 Hypothyroidism, unspecified: Secondary | ICD-10-CM | POA: Diagnosis not present

## 2015-01-12 LAB — TSH: TSH: 1.97 u[IU]/mL (ref 0.35–4.50)

## 2015-01-19 ENCOUNTER — Encounter: Payer: Self-pay | Admitting: Internal Medicine

## 2015-01-19 ENCOUNTER — Ambulatory Visit (INDEPENDENT_AMBULATORY_CARE_PROVIDER_SITE_OTHER): Payer: Medicare Other | Admitting: Internal Medicine

## 2015-01-19 VITALS — BP 146/70 | Temp 98.0°F | Ht 63.0 in | Wt 161.3 lb

## 2015-01-19 DIAGNOSIS — I10 Essential (primary) hypertension: Secondary | ICD-10-CM

## 2015-01-19 DIAGNOSIS — E039 Hypothyroidism, unspecified: Secondary | ICD-10-CM

## 2015-01-19 DIAGNOSIS — Z79899 Other long term (current) drug therapy: Secondary | ICD-10-CM | POA: Diagnosis not present

## 2015-01-19 DIAGNOSIS — F32A Depression, unspecified: Secondary | ICD-10-CM

## 2015-01-19 DIAGNOSIS — F329 Major depressive disorder, single episode, unspecified: Secondary | ICD-10-CM | POA: Diagnosis not present

## 2015-01-19 NOTE — Patient Instructions (Addendum)
Continue   Same  Thyroid med.  Limit   The alcohol as calories add up also .  Get a BP monitor Take blood pressure readings twice a day for 10 - 14  days and then periodically .To ensure below 140/90   .Send in readings      Exercise and  Diet changes can help blood pressure .      Why follow it? Research shows. . Those who follow the Mediterranean diet have a reduced risk of heart disease  . The diet is associated with a reduced incidence of Parkinson's and Alzheimer's diseases . People following the diet may have longer life expectancies and lower rates of chronic diseases  . The Dietary Guidelines for Americans recommends the Mediterranean diet as an eating plan to promote health and prevent disease  What Is the Mediterranean Diet?  . Healthy eating plan based on typical foods and recipes of Mediterranean-style cooking . The diet is primarily a plant based diet; these foods should make up a majority of meals   Starches - Plant based foods should make up a majority of meals - They are an important sources of vitamins, minerals, energy, antioxidants, and fiber - Choose whole grains, foods high in fiber and minimally processed items  - Typical grain sources include wheat, oats, barley, corn, brown rice, bulgar, farro, millet, polenta, couscous  - Various types of beans include chickpeas, lentils, fava beans, black beans, white beans   Fruits  Veggies - Large quantities of antioxidant rich fruits & veggies; 6 or more servings  - Vegetables can be eaten raw or lightly drizzled with oil and cooked  - Vegetables common to the traditional Mediterranean Diet include: artichokes, arugula, beets, broccoli, brussel sprouts, cabbage, carrots, celery, collard greens, cucumbers, eggplant, kale, leeks, lemons, lettuce, mushrooms, okra, onions, peas, peppers, potatoes, pumpkin, radishes, rutabaga, shallots, spinach, sweet potatoes, turnips, zucchini - Fruits common to the Mediterranean Diet include:  apples, apricots, avocados, cherries, clementines, dates, figs, grapefruits, grapes, melons, nectarines, oranges, peaches, pears, pomegranates, strawberries, tangerines  Fats - Replace butter and margarine with healthy oils, such as olive oil, canola oil, and tahini  - Limit nuts to no more than a handful a day  - Nuts include walnuts, almonds, pecans, pistachios, pine nuts  - Limit or avoid candied, honey roasted or heavily salted nuts - Olives are central to the [employer]iterranean diet - can be eaten whole or used in a variety of dishes   Meats Protein - Limiting red meat: no more than a few times a month - When eating red meat: choose lean cuts and keep the portion to the size of deck of cards - Eggs: approx. 0 to 4 times a week  - Fish and lean poultry: at least 2 a week  - Healthy protein sources include, chicken, [place], lean beef, lamb - Increase intake of seafood such as tuna, salmon, trout, mackerel, shrimp, scallops - Avoid or limit high fat processed meats such as sausage and bacon  Dairy - Include moderate amounts of low fat dairy products  - Focus on healthy dairy such as fat free yogurt, skim milk, low or reduced fat cheese - Limit dairy products higher in fat such as whole or 2% milk, cheese, ice cream  Alcohol - Moderate amounts of red wine is ok  - No more than 5 oz daily for women (all ages) and men older than age 71  - No more than 10 oz of wine daily for men younger than  65  Other - Limit sweets and other desserts  - Use herbs and spices instead of salt to flavor foods  - Herbs and spices common to the traditional Mediterranean Diet include: basil, bay leaves, chives, cloves, cumin, fennel, garlic, lavender, marjoram, mint, oregano, parsley, pepper, rosemary, sage, savory, sumac, tarragon, thyme   It's not just a diet, it's a lifestyle:  . The Mediterranean diet includes lifestyle factors typical of those in the region  . Foods, drinks and meals are best eaten with others and  savored . Daily physical activity is important for overall good health . This could be strenuous exercise like running and aerobics . This could also be more leisurely activities such as walking, housework, yard-work, or taking the stairs . Moderation is the key; a balanced and healthy diet accommodates most foods and drinks . Consider portion sizes and frequency of consumption of certain foods   Meal Ideas & Options:  . Breakfast:  o Whole wheat toast or whole wheat English muffins with peanut butter & hard boiled egg o Steel cut oats topped with apples & cinnamon and skim milk  o Fresh fruit: banana, strawberries, melon, berries, peaches  o Smoothies: strawberries, bananas, greek yogurt, peanut butter o Low fat greek yogurt with blueberries and granola  o Egg white omelet with spinach and mushrooms o Breakfast couscous: whole wheat couscous, apricots, skim milk, cranberries  . Sandwiches:  o Hummus and grilled vegetables (peppers, zucchini, squash) on whole wheat bread   o Grilled chicken on whole wheat pita with lettuce, tomatoes, cucumbers or tzatziki  o Tuna salad on whole wheat bread: tuna salad made with greek yogurt, olives, red peppers, capers, green onions o Garlic rosemary lamb pita: lamb sauted with garlic, rosemary, salt & pepper; add lettuce, cucumber, greek yogurt to pita - flavor with lemon juice and black pepper  . Seafood:  o Mediterranean grilled salmon, seasoned with garlic, basil, parsley, lemon juice and black pepper o Shrimp, lemon, and spinach whole-grain pasta salad made with low fat greek yogurt  o Seared scallops with lemon orzo  o Seared tuna steaks seasoned salt, pepper, coriander topped with tomato mixture of olives, tomatoes, olive oil, minced garlic, parsley, green onions and cappers  . Meats:  o Herbed greek chicken salad with kalamata olives, cucumber, feta  o Red bell peppers stuffed with spinach, bulgur, lean ground beef (or lentils) & topped with feta    o Kebabs: skewers of chicken, tomatoes, onions, zucchini, squash  o [place] burgers: made with red onions, mint, dill, lemon juice, feta cheese topped with roasted red peppers . Vegetarian o Cucumber salad: cucumbers, artichoke hearts, celery, red onion, feta cheese, tossed in olive oil & lemon juice  o Hummus and whole grain pita points with a greek salad (lettuce, tomato, feta, olives, cucumbers, red onion) o Lentil soup with celery, carrots made with vegetable broth, garlic, salt and pepper  o Tabouli salad: parsley, bulgur, mint, scallions, cucumbers, tomato, radishes, lemon juice, olive oil, salt and pepper.

## 2015-01-19 NOTE — Progress Notes (Signed)
Pre visit review using our clinic review tool, if applicable. No additional management support is needed unless otherwise documented below in the visit note.  Chief Complaint  Patient presents with  . Follow-up    thyroid bp meds     HPI: Elaine Howell 69 y.o. comesin for fu thyroid and bp management  Thyroid  Inc dose  Doing ok  Not exerciseing again after ribv injury  Taking  99  Lev   Per day  bp not checking  Taking meds  etoh slid a bit  2 wine and 1 drink per night recently had given up for lent  Mood ok.  ROS: See pertinent positives and negatives per HPI.has gained back want s to lose gs hs graduation in [place]  Past Medical History  Diagnosis Date  . Depression   . Hypertension   . Hyperlipidemia   . Chronic insomnia   . Thyroiditis     positive antibodies  . Heavy alcohol use 07/27/2010    Qualifier: Diagnosis of  By: Fabian Sharp MD, Neta Mends Daily use       Family History  Problem Relation Age of Onset  . Heart disease Mother   . Alcohol abuse Father   . Heart disease Father   . Hypertension Sister     History   Social History  . Marital Status: Married    Spouse Name: N/A  . Number of Children: N/A  . Years of Education: N/A   Social History Main Topics  . Smoking status: Former Smoker    Start date: 07/24/1964    Quit date: 07/25/1999  . Smokeless tobacco: Not on file  . Alcohol Use: 8.4 oz/week    14 Glasses of wine per week     Comment: heavy alcohol use  . Drug Use: No  . Sexual Activity: Not on file   Other Topics Concern  . 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   Reg exercise 6-7 x per week.     Outpatient Encounter Prescriptions as of 01/19/2015  Medication Sig  . amLODipine (NORVASC) 10 MG tablet take 1 tablet by mouth once daily  . buPROPion (WELLBUTRIN XL) 300 MG 24 hr tablet take 1 tablet  by mouth every morning  . DULoxetine (CYMBALTA) 60 MG capsule take 1 capsule by mouth once daily  . levothyroxine (SYNTHROID, LEVOTHROID) 88 MCG tablet Take 1 tablet (88 mcg total) by mouth daily.    EXAM:  BP 146/70 mmHg  Temp(Src) 98 F (36.7 C) (Oral)  Ht  (1.6 m)  Wt 161 lb 4.8 oz (73.165 kg)  BMI 28.58 kg/m2  Body mass index is 28.58 kg/(m^2).  GENERAL: vitals reviewed and listed above, alert, oriented, appears well hydrated and in no acute distress HEENT: atraumatic, conjunctiva  clear, no obvious abnormalities on inspection of external nose and ears NECK: no obvious masses on inspection palpation  LUNGS: clear to auscultation bilaterally, no wheezes, rales or rhonchi, good air movement CV: HRRR, no clubbing cyanosis or  peripheral edema nl cap refill  PSYCH: pleasant and cooperative, no obvious depression or anxiety Lab Results  Component Value Date   TSH 1.97 01/12/2015    BP Readings from Last 3 Encounters:  01/19/15 146/70  10/20/14 136/90  10/12/14 160/80  Wt Readings from Last 3 Encounters:  01/19/15 161 lb 4.8 oz (73.165 kg)  10/20/14 160 lb 12.8 oz (72.938 kg)  01/23/14 155 lb (70.308 kg)    ASSESSMENT AND PLAN:  Discussed the following assessment and plan:  Hypothyroidism, unspecified hypothyroidism type - continue same med   Essential hypertension - hp today  lsi counseled limt etoh etc  ge tmachine   Medication management  Depression - stable stay on meds  Will intensify  Medication if not controlled  Counseled. About lsi weight strategies  Cont meds really needs monitor to decide on med protocols if still up add meds  Total visit > 50% spent counseling and coordinating care  Regarding etoh exercise diet and bp   -Patient advised to return or notify health care team  if symptoms worsen ,persist or new concerns arise.  Patient Instructions   Continue   Same  Thyroid med.  Limit   The alcohol as calories add up also .  Get a BP  monitor Take blood pressure readings twice a day for 10 - 14  days and then periodically .To ensure below 140/90   .Send in readings      Exercise and  Diet changes can help blood pressure .      Why follow it? Research shows. . Those who follow the Mediterranean diet have a reduced risk of heart disease  . The diet is associated with a reduced incidence of Parkinson's and Alzheimer's diseases . People following the diet may have longer life expectancies and lower rates of chronic diseases  . The Dietary Guidelines for Americans recommends the Mediterranean diet as an eating plan to promote health and prevent disease  What Is the Mediterranean Diet?  . Healthy eating plan based on typical foods and recipes of Mediterranean-style cooking . The diet is primarily a plant based diet; these foods should make up a majority of meals   Starches - Plant based foods should make up a majority of meals - They are an important sources of vitamins, minerals, energy, antioxidants, and fiber - Choose whole grains, foods high in fiber and minimally processed items  - Typical grain sources include wheat, oats, barley, corn, brown rice, bulgar, farro, millet, polenta, couscous  - Various types of beans include chickpeas, lentils, fava beans, black beans, white beans   Fruits  Veggies - Large quantities of antioxidant rich fruits & veggies; 6 or more servings  - Vegetables can be eaten raw or lightly drizzled with oil and cooked  - Vegetables common to the traditional Mediterranean Diet include: artichokes, arugula, beets, broccoli, brussel sprouts, cabbage, carrots, celery, collard greens, cucumbers, eggplant, kale, leeks, lemons, lettuce, mushrooms, okra, onions, peas, peppers, potatoes, pumpkin, radishes, rutabaga, shallots, spinach, sweet potatoes, turnips, zucchini - Fruits common to the Mediterranean Diet include: apples, apricots, avocados, cherries, clementines, dates, figs, grapefruits, grapes, melons,  nectarines, oranges, peaches, pears, pomegranates, strawberries, tangerines  Fats - Replace butter and margarine with healthy oils, such as olive oil, canola oil, and tahini  - Limit nuts to no more than a handful a day  - Nuts include walnuts, almonds, pecans, pistachios, pine nuts  - Limit or avoid candied, honey roasted or heavily salted nuts - Olives are central to the [employer] - can be eaten whole or used in a variety of dishes   Meats Protein - Limiting red meat: no more than a few times a month - When eating red meat: choose lean cuts and keep the portion  to the size of deck of cards - Eggs: approx. 0 to 4 times a week  - Fish and lean poultry: at least 2 a week  - Healthy protein sources include, chicken, [place], lean beef, lamb - Increase intake of seafood such as tuna, salmon, trout, mackerel, shrimp, scallops - Avoid or limit high fat processed meats such as sausage and bacon  Dairy - Include moderate amounts of low fat dairy products  - Focus on healthy dairy such as fat free yogurt, skim milk, low or reduced fat cheese - Limit dairy products higher in fat such as whole or 2% milk, cheese, ice cream  Alcohol - Moderate amounts of red wine is ok  - No more than 5 oz daily for women (all ages) and men older than age 58  - No more than 10 oz of wine daily for men younger than 35  Other - Limit sweets and other desserts  - Use herbs and spices instead of salt to flavor foods  - Herbs and spices common to the traditional Mediterranean Diet include: basil, bay leaves, chives, cloves, cumin, fennel, garlic, lavender, marjoram, mint, oregano, parsley, pepper, rosemary, sage, savory, sumac, tarragon, thyme   It's not just a diet, it's a lifestyle:  . The Mediterranean diet includes lifestyle factors typical of those in the region  . Foods, drinks and meals are best eaten with others and savored . Daily physical activity is important for overall good health . This could be  strenuous exercise like running and aerobics . This could also be more leisurely activities such as walking, housework, yard-work, or taking the stairs . Moderation is the key; a balanced and healthy diet accommodates most foods and drinks . Consider portion sizes and frequency of consumption of certain foods   Meal Ideas & Options:  . Breakfast:  o Whole wheat toast or whole wheat English muffins with peanut butter & hard boiled egg o Steel cut oats topped with apples & cinnamon and skim milk  o Fresh fruit: banana, strawberries, melon, berries, peaches  o Smoothies: strawberries, bananas, greek yogurt, peanut butter o Low fat greek yogurt with blueberries and granola  o Egg white omelet with spinach and mushrooms o Breakfast couscous: whole wheat couscous, apricots, skim milk, cranberries  . Sandwiches:  o Hummus and grilled vegetables (peppers, zucchini, squash) on whole wheat bread   o Grilled chicken on whole wheat pita with lettuce, tomatoes, cucumbers or tzatziki  o Tuna salad on whole wheat bread: tuna salad made with greek yogurt, olives, red peppers, capers, green onions o Garlic rosemary lamb pita: lamb sauted with garlic, rosemary, salt & pepper; add lettuce, cucumber, greek yogurt to pita - flavor with lemon juice and black pepper  . Seafood:  o Mediterranean grilled salmon, seasoned with garlic, basil, parsley, lemon juice and black pepper o Shrimp, lemon, and spinach whole-grain pasta salad made with low fat greek yogurt  o Seared scallops with lemon orzo  o Seared tuna steaks seasoned salt, pepper, coriander topped with tomato mixture of olives, tomatoes, olive oil, minced garlic, parsley, green onions and cappers  . Meats:  o Herbed greek chicken salad with kalamata olives, cucumber, feta  o Red bell peppers stuffed with spinach, bulgur, lean ground beef (or lentils) & topped with feta   o Kebabs: skewers of chicken, tomatoes, onions, zucchini, squash  o [place] burgers:  made with red onions, mint, dill, lemon juice, feta cheese topped with roasted red peppers . Vegetarian o Cucumber salad:  cucumbers, artichoke hearts, celery, red onion, feta cheese, tossed in olive oil & lemon juice  o Hummus and whole grain pita points with a greek salad (lettuce, tomato, feta, olives, cucumbers, red onion) o Lentil soup with celery, carrots made with vegetable broth, garlic, salt and pepper  o Tabouli salad: parsley, bulgur, mint, scallions, cucumbers, tomato, radishes, lemon juice, olive oil, salt and pepper.                 Neta MendsWanda K. Panosh M.D.

## 2015-01-21 DIAGNOSIS — H524 Presbyopia: Secondary | ICD-10-CM | POA: Diagnosis not present

## 2015-01-21 DIAGNOSIS — Z961 Presence of intraocular lens: Secondary | ICD-10-CM | POA: Diagnosis not present

## 2015-03-12 ENCOUNTER — Ambulatory Visit (INDEPENDENT_AMBULATORY_CARE_PROVIDER_SITE_OTHER): Payer: Medicare Other | Admitting: Family Medicine

## 2015-03-12 ENCOUNTER — Encounter: Payer: Self-pay | Admitting: Family Medicine

## 2015-03-12 VITALS — BP 138/80 | HR 81 | Temp 99.0°F

## 2015-03-12 DIAGNOSIS — J039 Acute tonsillitis, unspecified: Secondary | ICD-10-CM | POA: Diagnosis not present

## 2015-03-12 MED ORDER — CEPHALEXIN 500 MG PO CAPS: 500.0000 mg | ORAL_CAPSULE | Freq: Three times a day (TID) | ORAL | Status: AC

## 2015-03-12 MED ORDER — METHYLPREDNISOLONE 4 MG PO TBPK: ORAL_TABLET | ORAL | Status: DC

## 2015-03-12 NOTE — Progress Notes (Signed)
Pre visit review using our clinic review tool, if applicable. No additional management support is needed unless otherwise documented below in the visit note.  Pt declined to have a weight//acm

## 2015-03-12 NOTE — Progress Notes (Signed)
   Subjective:    Patient ID: Elaine Howell, female    DOB: 1945/11/20, 69 y.o.   MRN: [id number]  HPI Here for one week of left sided ST, body aches and a low grade fever. No cough. Using lozenges and salt water rinses.    Review of Systems  Constitutional: Positive for fever.  HENT: Positive for sore throat. Negative for congestion, postnasal drip and sinus pressure.   Eyes: Negative.   Respiratory: Negative.   Gastrointestinal: Negative.        Objective:   Physical Exam  Constitutional: She appears well-developed and well-nourished.  HENT:  Right Ear: External ear normal.  Left Ear: External ear normal.  Nose: Nose normal.  Mouth/Throat: Oropharynx is clear and moist.  Eyes: Conjunctivae are normal.  Neck: No thyromegaly present.  Pulmonary/Chest: Effort normal and breath sounds normal.  Lymphadenopathy:    She has no cervical adenopathy.          Assessment & Plan:  Early tonsillitis. Treat with Keflex and a Medrol dose pack. Drink fluids.

## 2015-03-25 ENCOUNTER — Other Ambulatory Visit: Payer: Self-pay | Admitting: Internal Medicine

## 2015-03-26 NOTE — Telephone Encounter (Signed)
Sent to the pharmacy by e-scribe. 

## 2015-06-17 ENCOUNTER — Encounter: Payer: Self-pay | Admitting: Internal Medicine

## 2015-06-30 DIAGNOSIS — Z23 Encounter for immunization: Secondary | ICD-10-CM | POA: Diagnosis not present

## 2015-09-23 ENCOUNTER — Other Ambulatory Visit: Payer: Self-pay | Admitting: Internal Medicine

## 2015-10-01 ENCOUNTER — Other Ambulatory Visit: Payer: Self-pay | Admitting: Internal Medicine

## 2015-10-01 NOTE — Telephone Encounter (Signed)
Sent to the pharmacy by e-scribe.  Pt has upcoming wellness on 12/07/15 

## 2015-10-19 ENCOUNTER — Other Ambulatory Visit: Payer: Medicare Other

## 2015-10-21 ENCOUNTER — Other Ambulatory Visit: Payer: Self-pay | Admitting: Family Medicine

## 2015-10-21 MED ORDER — BUPROPION HCL ER (XL) 300 MG PO TB24: 300.0000 mg | ORAL_TABLET | Freq: Every morning | ORAL | Status: DC

## 2015-10-21 NOTE — Telephone Encounter (Signed)
Sent to the pharmacy by e-scribe.  Pt has upcoming cpx on 12/07/15

## 2015-10-26 ENCOUNTER — Encounter: Payer: Medicare Other | Admitting: Internal Medicine

## 2015-11-19 ENCOUNTER — Telehealth: Payer: Self-pay | Admitting: *Deleted

## 2015-11-19 MED ORDER — DULOXETINE HCL 60 MG PO CPEP: 60.0000 mg | ORAL_CAPSULE | Freq: Every day | ORAL | Status: DC

## 2015-11-19 NOTE — Telephone Encounter (Signed)
Sent to the pharmacy by e-scribe.  Has upcoming cpx on 12/07/15.

## 2015-11-19 NOTE — Telephone Encounter (Signed)
[facility] [address] [phone]  DULoxetine (CYMBALTA) 60 MG capsule #90

## 2015-12-01 ENCOUNTER — Other Ambulatory Visit (INDEPENDENT_AMBULATORY_CARE_PROVIDER_SITE_OTHER): Payer: Medicare Other

## 2015-12-01 DIAGNOSIS — Z0000 Encounter for general adult medical examination without abnormal findings: Secondary | ICD-10-CM

## 2015-12-01 LAB — BASIC METABOLIC PANEL
BUN: 17 mg/dL (ref 6–23)
CO2: 26 mEq/L (ref 19–32)
Calcium: 10.3 mg/dL (ref 8.4–10.5)
Chloride: 103 mEq/L (ref 96–112)
Creatinine, Ser: 0.77 mg/dL (ref 0.40–1.20)
GFR: 78.3 mL/min (ref >60.00)
GLUCOSE: 101 mg/dL — AB (ref 70–99)
POTASSIUM: 5.6 meq/L — AB (ref 3.5–5.1)
SODIUM: 139 meq/L (ref 135–145)

## 2015-12-01 LAB — CBC WITH DIFFERENTIAL/PLATELET
Basophils Absolute: 0 10*3/uL (ref 0.0–0.1)
Basophils Relative: 0.7 % (ref 0.0–3.0)
Eosinophils Absolute: 0.1 10*3/uL (ref 0.0–0.7)
Eosinophils Relative: 2.1 % (ref 0.0–5.0)
HCT: 39.6 % (ref 36.0–46.0)
Hemoglobin: 13.8 g/dL (ref 12.0–15.0)
LYMPHS ABS: 1.6 10*3/uL (ref 0.7–4.0)
Lymphocytes Relative: 37.5 % (ref 12.0–46.0)
MCHC: 34.8 g/dL (ref 30.0–36.0)
MCV: 92.1 fl (ref 78.0–100.0)
MONO ABS: 0.5 10*3/uL (ref 0.1–1.0)
MONOS PCT: 11.2 % (ref 3.0–12.0)
NEUTROS PCT: 48.5 % (ref 43.0–77.0)
Neutro Abs: 2.1 10*3/uL (ref 1.4–7.7)
Platelets: 376 10*3/uL (ref 150.0–400.0)
RBC: 4.3 Mil/uL (ref 3.87–5.11)
RDW: 13.5 % (ref 11.5–15.5)
WBC: 4.3 10*3/uL (ref 4.0–10.5)

## 2015-12-01 LAB — LIPID PANEL
Cholesterol: 312 mg/dL — ABNORMAL HIGH (ref 0–200)
HDL: 104.1 mg/dL (ref >39.00)
LDL CALC: 191 mg/dL — AB (ref 0–99)
NONHDL: 207.41
Total CHOL/HDL Ratio: 3
Triglycerides: 83 mg/dL (ref 0.0–149.0)
VLDL: 16.6 mg/dL (ref 0.0–40.0)

## 2015-12-01 LAB — HEPATIC FUNCTION PANEL
ALK PHOS: 88 U/L (ref 39–117)
ALT: 30 U/L (ref 0–35)
AST: 28 U/L (ref 0–37)
Albumin: 4.8 g/dL (ref 3.5–5.2)
BILIRUBIN TOTAL: 0.6 mg/dL (ref 0.2–1.2)
Bilirubin, Direct: 0.1 mg/dL (ref 0.0–0.3)
Total Protein: 7.2 g/dL (ref 6.0–8.3)

## 2015-12-01 LAB — TSH: TSH: 4.33 u[IU]/mL (ref 0.35–4.50)

## 2015-12-07 ENCOUNTER — Encounter: Payer: Self-pay | Admitting: Internal Medicine

## 2015-12-07 ENCOUNTER — Ambulatory Visit (INDEPENDENT_AMBULATORY_CARE_PROVIDER_SITE_OTHER): Payer: Medicare Other | Admitting: Internal Medicine

## 2015-12-07 VITALS — BP 148/88 | Temp 98.0°F | Ht 63.0 in | Wt 161.0 lb

## 2015-12-07 DIAGNOSIS — J069 Acute upper respiratory infection, unspecified: Secondary | ICD-10-CM

## 2015-12-07 DIAGNOSIS — E039 Hypothyroidism, unspecified: Secondary | ICD-10-CM

## 2015-12-07 DIAGNOSIS — E785 Hyperlipidemia, unspecified: Secondary | ICD-10-CM | POA: Diagnosis not present

## 2015-12-07 DIAGNOSIS — Z0000 Encounter for general adult medical examination without abnormal findings: Secondary | ICD-10-CM

## 2015-12-07 DIAGNOSIS — I10 Essential (primary) hypertension: Secondary | ICD-10-CM | POA: Diagnosis not present

## 2015-12-07 DIAGNOSIS — F329 Major depressive disorder, single episode, unspecified: Secondary | ICD-10-CM

## 2015-12-07 DIAGNOSIS — Z79899 Other long term (current) drug therapy: Secondary | ICD-10-CM | POA: Diagnosis not present

## 2015-12-07 DIAGNOSIS — Z974 Presence of external hearing-aid: Secondary | ICD-10-CM

## 2015-12-07 DIAGNOSIS — F32A Depression, unspecified: Secondary | ICD-10-CM

## 2015-12-07 MED ORDER — LISINOPRIL 10 MG PO TABS: 10.0000 mg | ORAL_TABLET | Freq: Every day | ORAL | Status: DC

## 2015-12-07 MED ORDER — ROSUVASTATIN CALCIUM 10 MG PO TABS: 10.0000 mg | ORAL_TABLET | Freq: Every day | ORAL | Status: DC

## 2015-12-07 NOTE — Assessment & Plan Note (Signed)
Now in the 300 range with suggest adding a statin medication in addition to her lifestyle intervention prescription given for generic Crestor 10 mg once a day follow-up lipids in 3 months and reassess.

## 2015-12-07 NOTE — Assessment & Plan Note (Signed)
Seems to be doing quite well on Wellbutrin and Cymbalta. Continue.

## 2015-12-07 NOTE — Assessment & Plan Note (Signed)
Size intensification of therapy patient wants to check blood pressure off alcohol and then if still increased we'll add medication prescription given for lisinopril 10 mg once a day and follow-up in 3 months.

## 2015-12-07 NOTE — Progress Notes (Signed)
Chief Complaint  Patient presents with  . Medicare Wellness    sore throat   andcough night   . Hypertension  . Hypothyroidism    HPI: Elaine Howell 70 y.o. comes in today for Preventive Medicare wellness visit . And Chronic disease management HT: high side taking amlodipine  daily  THYROID: dialy  LIPIDS: sis has high lipids responded to low dose statin MOOD:    Mood ok with this.  ETOH: gave up for lent.  Retired   Still helping.  Out volunteer St cough for a few days off and on no sob no fever  Health Maintenance  Topic Date Due  . DEXA SCAN  11/03/2008  . MAMMOGRAM  01/01/2011  . COLONOSCOPY  10/02/2014  . INFLUENZA VACCINE  05/02/2016  . TETANUS/TDAP  10/20/2024  . ZOSTAVAX  Completed  . Hepatitis C Screening  Completed  . PNA vac Low Risk Adult  Completed   Health Maintenance Review LIFESTYLE:  TAD no tobac  Sugar beverages:  no Sleep: 8 hours when not sick  Exercise treatmill  and trainer  2 x per week.   MEDICARE DOCUMENT QUESTIONS  TO SCAN   Hearing: ok hearing aids   At times   Vision:  No limitations at present . Last eye check UTD  Safety:  Has smoke detector and wears seat belts.  No firearms. No excess sun exposure. Sees dentist regularly.  Falls: n  Advance directive :  Reviewed  Has one.  Memory: Felt to be good  , no concern from her or her family.  Depression: No anhedonia unusual crying or depressive symptoms  Nutrition: Eats well balanced diet; adequate calcium and vitamin D. No swallowing chewing problems.  Injury: no major injuries in the last six months.  Other healthcare providers:  Reviewed today .  Social:  Lives alone  Social with sis. No pets.   Preventive parameters: up-to-date  Reviewed   ADLS:   There are no problems or need for assistance  driving, feeding, obtaining food, dressing, toileting and bathing, managing money using phone. She is independent.    ROS: hard to lose weight eats out exercises  Left  shoulder has some  Discomfort with machines GEN/ HEENT: No fever, significant weight changes sweats headaches vision problems hearing changes, CV/ PULM; No chest pain shortness of breath cough, syncope,edema  change in exercise tolerance. GI /GU: No adominal pain, vomiting, change in bowel habits. No blood in the stool. No significant GU symptoms. SKIN/HEME: ,no acute skin rashes suspicious lesions or bleeding. No lymphadenopathy, nodules, masses.  NEURO/ PSYCH:  No neurologic signs such as weakness numbness. No depression anxiety. IMM/ Allergy: No unusual infections.  Allergy .   REST of 12 system review negative except as per HPI   Past Medical History  Diagnosis Date  . Depression   . Hypertension   . Hyperlipidemia   . Chronic insomnia   . Thyroiditis     positive antibodies  . Heavy alcohol use 07/27/2010    Qualifier: Diagnosis of  By: Regis Bill MD, Standley Brooking Daily use       Family History  Problem Relation Age of Onset  . Heart disease Mother   . Alcohol abuse Father   . Heart disease Father   . Hypertension Sister     Social History   Social History  . Marital Status: Married    Spouse Name: N/A  . Number of Children: N/A  . Years of Education: N/A  Social History Main Topics  . Smoking status: Former Smoker    Start date: 07/24/1964    Quit date: 07/25/1999  . Smokeless tobacco: None  . Alcohol Use: 8.4 oz/week    14 Glasses of wine per week     Comment: heavy alcohol use  . Drug Use: No  . Sexual Activity: Not Asked   Other Topics Concern  . None   Social History Narrative      hh of 1   Part time [place] as a second language    Now retired    [facility] of 2 no pets   G2P2   Stop drinking wine one cocktail at night.   Reg exercise 6-7 x per week.     Outpatient Encounter Prescriptions as of 12/07/2015  Medication Sig  . amLODipine (NORVASC) 10 MG tablet take 1 tablet by mouth once daily  . buPROPion (WELLBUTRIN XL) 300 MG 24 hr tablet Take 1 tablet  (300 mg total) by mouth every morning.  . DULoxetine (CYMBALTA) 60 MG capsule Take 1 capsule (60 mg total) by mouth daily.  Marland Kitchen levothyroxine (SYNTHROID, LEVOTHROID) 88 MCG tablet take 1 tablet by mouth once daily  . lisinopril (PRINIVIL) 10 MG tablet Take 1 tablet (10 mg total) by mouth daily. For high blood pressure  . rosuvastatin (CRESTOR) 10 MG tablet Take 1 tablet (10 mg total) by mouth daily.  . [DISCONTINUED] methylPREDNISolone (MEDROL DOSEPAK) 4 MG TBPK tablet As directed   No facility-administered encounter medications on file as of 12/07/2015.    EXAM:  BP 148/88 mmHg  Temp(Src) 98 F (36.7 C) (Oral)  Ht '5\' 3"'  (1.6 m)  Wt 161 lb (73.029 kg)  BMI 28.53 kg/m2  Body mass index is 28.53 kg/(m^2).  Physical Exam: Vital signs reviewed EPP:IRJJ is a well-developed well-nourished alert cooperative   who appears stated age in no acute distress.  HEENT: normocephalic atraumatic , Eyes: PERRL EOM's full, conjunctiva clear, Nares: paten,t no deformity discharge or tenderness., Ears: no deformity EAC's clear TMs with normal landmarks. Mouth: clear OP, no lesions, edema.  Moist mucous membranes. Dentition in adequate repair. NECK: supple without masses, thyromegaly or bruits. CHEST/PULM:  Clear to auscultation and percussion breath sounds equal no wheeze , rales or rhonchi. No chest wall deformities or tenderness.Breast: normal by inspection . No dimpling, discharge, masses, tenderness or discharge . CV: PMI is nondisplaced, S1 S2 no gallops, murmurs, rubs. Peripheral pulses are full without delay.No JVD .  ABDOMEN: Bowel sounds normal nontender  No guard or rebound, no hepato splenomegal no CVA tenderness.   Extremtities:  No clubbing cyanosis or edema, no acute joint swelling or redness no focal atrophy NEURO:  Oriented x3, cranial nerves 3-12 appear to be intact, no obvious focal weakness,gait within normal limits no abnormal reflexes or asymmetrical SKIN: No acute rashes normal turgor,  color, no bruising or petechiae. PSYCH: Oriented, good eye contact, no obvious depression anxiety, cognition and judgment appear normal. LN: no cervical axillary inguinal adenopathy No noted deficits in memory, attention, and speech.   Lab Results  Component Value Date   WBC 4.3 12/01/2015   HGB 13.8 12/01/2015   HCT 39.6 12/01/2015   PLT 376.0 12/01/2015   GLUCOSE 101* 12/01/2015   CHOL 312* 12/01/2015   TRIG 83.0 12/01/2015   HDL 104.10 12/01/2015   LDLDIRECT 146.3 09/17/2013   LDLCALC 191* 12/01/2015   ALT 30 12/01/2015   AST 28 12/01/2015   NA 139 12/01/2015   K 5.6* 12/01/2015  CL 103 12/01/2015   CREATININE 0.77 12/01/2015   BUN 17 12/01/2015   CO2 26 12/01/2015   TSH 4.33 12/01/2015   BP Readings from Last 3 Encounters:  12/07/15 148/88  03/12/15 138/80  01/19/15 146/70   Wt Readings from Last 3 Encounters:  12/07/15 161 lb (73.029 kg)  01/19/15 161 lb 4.8 oz (73.165 kg)  10/20/14 160 lb 12.8 oz (72.938 kg)     ASSESSMENT AND PLAN:  Discussed the following assessment and plan:  Visit for preventive health examination  Essential hypertension  Medication management  Hypothyroidism, unspecified hypothyroidism type  Hyperlipidemia  Medicare annual wellness visit, subsequent  HLD (hyperlipidemia)  Wears hearing aid  Depression  URI, acute - uncomplicated  expectant managment  Agree with dec etoh use  As  Planned  Patient Care Team: Burnis Medin, MD as PCP - General Rutherford Guys, MD as Attending Physician (Ophthalmology) Azucena Fallen, MD as Attending Physician (Obstetrics and Gynecology)  Patient Instructions  Cholesterol is very high  We will try  adding  Statin medication to help bring it down   Generic crestor 10 mg per day .  Thyroid is in range .  Bp is too high again  Hold the alcohol as discussed .  If still up add   Medication  Lisinopril  10 mg per day  ROV  in 3 months or as needed  Health Maintenance, Female Adopting a  healthy lifestyle and getting preventive care can go a long way to promote health and wellness. Talk with your health care provider about what schedule of regular examinations is right for you. This is a good chance for you to check in with your provider about disease prevention and staying healthy. In between checkups, there are plenty of things you can do on your own. Experts have done a lot of research about which lifestyle changes and preventive measures are most likely to keep you healthy. Ask your health care provider for more information. WEIGHT AND DIET  Eat a healthy diet  Be sure to include plenty of vegetables, fruits, low-fat dairy products, and lean protein.  Do not eat a lot of foods high in solid fats, added sugars, or salt.  Get regular exercise. This is one of the most important things you can do for your health.  Most adults should exercise for at least 150 minutes each week. The exercise should increase your heart rate and make you sweat (moderate-intensity exercise).  Most adults should also do strengthening exercises at least twice a week. This is in addition to the moderate-intensity exercise.  Maintain a healthy weight  Body mass index (BMI) is a measurement that can be used to identify possible weight problems. It estimates body fat based on height and weight. Your health care provider can help determine your BMI and help you achieve or maintain a healthy weight.  For females 58 years of age and older:   A BMI below 18.5 is considered underweight.  A BMI of 18.5 to 24.9 is normal.  A BMI of 25 to 29.9 is considered overweight.  A BMI of 30 and above is considered obese.  Watch levels of cholesterol and blood lipids  You should start having your blood tested for lipids and cholesterol at 72 years of age, then have this test every 5 years.  You may need to have your cholesterol levels checked more often if:  Your lipid or cholesterol levels are high.  You  are older than 72 years of  age.  Dennis Bast are at high risk for heart disease.  CANCER SCREENING   Lung Cancer  Lung cancer screening is recommended for adults 24-65 years old who are at high risk for lung cancer because of a history of smoking.  A yearly low-dose CT scan of the lungs is recommended for people who:  Currently smoke.  Have quit within the past 15 years.  Have at least a 30-pack-year history of smoking. A pack year is smoking an average of one pack of cigarettes a day for 1 year.  Yearly screening should continue until it has been 15 years since you quit.  Yearly screening should stop if you develop a health problem that would prevent you from having lung cancer treatment.  Breast Cancer  Practice breast self-awareness. This means understanding how your breasts normally appear and feel.  It also means doing regular breast self-exams. Let your health care provider know about any changes, no matter how small.  If you are in your 20s or 30s, you should have a clinical breast exam (CBE) by a health care provider every 1-3 years as part of a regular health exam.  If you are 77 or older, have a CBE every year. Also consider having a breast X-ray (mammogram) every year.  If you have a family history of breast cancer, talk to your health care provider about genetic screening.  If you are at high risk for breast cancer, talk to your health care provider about having an MRI and a mammogram every year.  Breast cancer gene (BRCA) assessment is recommended for women who have family members with BRCA-related cancers. BRCA-related cancers include:  Breast.  Ovarian.  Tubal.  Peritoneal cancers.  Results of the assessment will determine the need for genetic counseling and BRCA1 and BRCA2 testing. Cervical Cancer Your health care provider may recommend that you be screened regularly for cancer of the pelvic organs (ovaries, uterus, and vagina). This screening involves a  pelvic examination, including checking for microscopic changes to the surface of your cervix (Pap test). You may be encouraged to have this screening done every 3 years, beginning at age 72.  For women ages 41-65, health care providers may recommend pelvic exams and Pap testing every 3 years, or they may recommend the Pap and pelvic exam, combined with testing for human papilloma virus (HPV), every 5 years. Some types of HPV increase your risk of cervical cancer. Testing for HPV may also be done on women of any age with unclear Pap test results.  Other health care providers may not recommend any screening for nonpregnant women who are considered low risk for pelvic cancer and who do not have symptoms. Ask your health care provider if a screening pelvic exam is right for you.  If you have had past treatment for cervical cancer or a condition that could lead to cancer, you need Pap tests and screening for cancer for at least 20 years after your treatment. If Pap tests have been discontinued, your risk factors (such as having a new sexual partner) need to be reassessed to determine if screening should resume. Some women have medical problems that increase the chance of getting cervical cancer. In these cases, your health care provider may recommend more frequent screening and Pap tests. Colorectal Cancer  This type of cancer can be detected and often prevented.  Routine colorectal cancer screening usually begins at 72 years of age and continues through 72 years of age.  Your health care provider may recommend  screening at an earlier age if you have risk factors for colon cancer.  Your health care provider may also recommend using home test kits to check for hidden blood in the stool.  A small camera at the end of a tube can be used to examine your colon directly (sigmoidoscopy or colonoscopy). This is done to check for the earliest forms of colorectal cancer.  Routine screening usually begins at age  61.  Direct examination of the colon should be repeated every 5-10 years through 72 years of age. However, you may need to be screened more often if early forms of precancerous polyps or small growths are found. Skin Cancer  Check your skin from head to toe regularly.  Tell your health care provider about any new moles or changes in moles, especially if there is a change in a mole's shape or color.  Also tell your health care provider if you have a mole that is larger than the size of a pencil eraser.  Always use sunscreen. Apply sunscreen liberally and repeatedly throughout the day.  Protect yourself by wearing long sleeves, pants, a wide-brimmed hat, and sunglasses whenever you are outside. HEART DISEASE, DIABETES, AND HIGH BLOOD PRESSURE   High blood pressure causes heart disease and increases the risk of stroke. High blood pressure is more likely to develop in:  People who have blood pressure in the high end of the normal range (130-139/85-89 mm Hg).  People who are overweight or obese.  People who are African American.  If you are 70-63 years of age, have your blood pressure checked every 3-5 years. If you are 39 years of age or older, have your blood pressure checked every year. You should have your blood pressure measured twice--once when you are at a hospital or clinic, and once when you are not at a hospital or clinic. Record the average of the two measurements. To check your blood pressure when you are not at a hospital or clinic, you can use:  An automated blood pressure machine at a pharmacy.  A home blood pressure monitor.  If you are between 35 years and 60 years old, ask your health care provider if you should take aspirin to prevent strokes.  Have regular diabetes screenings. This involves taking a blood sample to check your fasting blood sugar level.  If you are at a normal weight and have a low risk for diabetes, have this test once every three years after 72 years  of age.  If you are overweight and have a high risk for diabetes, consider being tested at a younger age or more often. PREVENTING INFECTION  Hepatitis B  If you have a higher risk for hepatitis B, you should be screened for this virus. You are considered at high risk for hepatitis B if:  You were born in a country where hepatitis B is common. Ask your health care provider which countries are considered high risk.  Your parents were born in a high-risk country, and you have not been immunized against hepatitis B (hepatitis B vaccine).  You have HIV or AIDS.  You use needles to inject street drugs.  You live with someone who has hepatitis B.  You have had sex with someone who has hepatitis B.  You get hemodialysis treatment.  You take certain medicines for conditions, including cancer, organ transplantation, and autoimmune conditions. Hepatitis C  Blood testing is recommended for:  Everyone born from [birth year] through 1967.  Anyone with known risk factors  for hepatitis C. Sexually transmitted infections (STIs)  You should be screened for sexually transmitted infections (STIs) including gonorrhea and chlamydia if:  You are sexually active and are younger than 72 years of age.  You are older than 72 years of age and your health care provider tells you that you are at risk for this type of infection.  Your sexual activity has changed since you were last screened and you are at an increased risk for chlamydia or gonorrhea. Ask your health care provider if you are at risk.  If you do not have HIV, but are at risk, it may be recommended that you take a prescription medicine daily to prevent HIV infection. This is called pre-exposure prophylaxis (PrEP). You are considered at risk if:  You are sexually active and do not regularly use condoms or know the HIV status of your partner(s).  You take drugs by injection.  You are sexually active with a partner who has HIV. Talk with your  health care provider about whether you are at high risk of being infected with HIV. If you choose to begin PrEP, you should first be tested for HIV. You should then be tested every 3 months for as long as you are taking PrEP.  PREGNANCY   If you are premenopausal and you may become pregnant, ask your health care provider about preconception counseling.  If you may become pregnant, take 400 to 800 micrograms (mcg) of folic acid every day.  If you want to prevent pregnancy, talk to your health care provider about birth control (contraception). OSTEOPOROSIS AND MENOPAUSE   Osteoporosis is a disease in which the bones lose minerals and strength with aging. This can result in serious bone fractures. Your risk for osteoporosis can be identified using a bone density scan.  If you are 92 years of age or older, or if you are at risk for osteoporosis and fractures, ask your health care provider if you should be screened.  Ask your health care provider whether you should take a calcium or vitamin D supplement to lower your risk for osteoporosis.  Menopause may have certain physical symptoms and risks.  Hormone replacement therapy may reduce some of these symptoms and risks. Talk to your health care provider about whether hormone replacement therapy is right for you.  HOME CARE INSTRUCTIONS   Schedule regular health, dental, and eye exams.  Stay current with your immunizations.   Do not use any tobacco products including cigarettes, chewing tobacco, or electronic cigarettes.  If you are pregnant, do not drink alcohol.  If you are breastfeeding, limit how much and how often you drink alcohol.  Limit alcohol intake to no more than 1 drink per day for nonpregnant women. One drink equals 12 ounces of beer, 5 ounces of wine, or 1 ounces of hard liquor.  Do not use street drugs.  Do not share needles.  Ask your health care provider for help if you need support or information about quitting  drugs.  Tell your health care provider if you often feel depressed.  Tell your health care provider if you have ever been abused or do not feel safe at home.   This information is not intended to replace advice given to you by your health care provider. Make sure you discuss any questions you have with your health care provider.   Document Released: 04/03/2011 Document Revised: 10/09/2014 Document Reviewed: 08/20/2013 Elsevier Interactive Patient Education 2016 [place]. Joey Hudock  M.D.

## 2015-12-07 NOTE — Patient Instructions (Addendum)
Cholesterol is very high  We will try  adding  Statin medication to help bring it down   Generic crestor 10 mg per day .  Thyroid is in range .  Bp is too high again  Hold the alcohol as discussed .  If still up add   Medication  Lisinopril  10 mg per day  ROV  in 3 months or as needed  Health Maintenance, Female Adopting a healthy lifestyle and getting preventive care can go a long way to promote health and wellness. Talk with your health care provider about what schedule of regular examinations is right for you. This is a good chance for you to check in with your provider about disease prevention and staying healthy. In between checkups, there are plenty of things you can do on your own. Experts have done a lot of research about which lifestyle changes and preventive measures are most likely to keep you healthy. Ask your health care provider for more information. WEIGHT AND DIET  Eat a healthy diet  Be sure to include plenty of vegetables, fruits, low-fat dairy products, and lean protein.  Do not eat a lot of foods high in solid fats, added sugars, or salt.  Get regular exercise. This is one of the most important things you can do for your health.  Most adults should exercise for at least 150 minutes each week. The exercise should increase your heart rate and make you sweat (moderate-intensity exercise).  Most adults should also do strengthening exercises at least twice a week. This is in addition to the moderate-intensity exercise.  Maintain a healthy weight  Body mass index (BMI) is a measurement that can be used to identify possible weight problems. It estimates body fat based on height and weight. Your health care provider can help determine your BMI and help you achieve or maintain a healthy weight.  For females 80 years of age and older:   A BMI below 18.5 is considered underweight.  A BMI of 18.5 to 24.9 is normal.  A BMI of 25 to 29.9 is considered overweight.  A BMI of 30  and above is considered obese.  Watch levels of cholesterol and blood lipids  You should start having your blood tested for lipids and cholesterol at 72 years of age, then have this test every 5 years.  You may need to have your cholesterol levels checked more often if:  Your lipid or cholesterol levels are high.  You are older than 72 years of age.  You are at high risk for heart disease.  CANCER SCREENING   Lung Cancer  Lung cancer screening is recommended for adults 31-63 years old who are at high risk for lung cancer because of a history of smoking.  A yearly low-dose CT scan of the lungs is recommended for people who:  Currently smoke.  Have quit within the past 15 years.  Have at least a 30-pack-year history of smoking. A pack year is smoking an average of one pack of cigarettes a day for 1 year.  Yearly screening should continue until it has been 15 years since you quit.  Yearly screening should stop if you develop a health problem that would prevent you from having lung cancer treatment.  Breast Cancer  Practice breast self-awareness. This means understanding how your breasts normally appear and feel.  It also means doing regular breast self-exams. Let your health care provider know about any changes, no matter how small.  If you are  in your 11s or 30s, you should have a clinical breast exam (CBE) by a health care provider every 1-3 years as part of a regular health exam.  If you are 57 or older, have a CBE every year. Also consider having a breast X-ray (mammogram) every year.  If you have a family history of breast cancer, talk to your health care provider about genetic screening.  If you are at high risk for breast cancer, talk to your health care provider about having an MRI and a mammogram every year.  Breast cancer gene (BRCA) assessment is recommended for women who have family members with BRCA-related cancers. BRCA-related cancers  include:  Breast.  Ovarian.  Tubal.  Peritoneal cancers.  Results of the assessment will determine the need for genetic counseling and BRCA1 and BRCA2 testing. Cervical Cancer Your health care provider may recommend that you be screened regularly for cancer of the pelvic organs (ovaries, uterus, and vagina). This screening involves a pelvic examination, including checking for microscopic changes to the surface of your cervix (Pap test). You may be encouraged to have this screening done every 3 years, beginning at age 51.  For women ages 15-65, health care providers may recommend pelvic exams and Pap testing every 3 years, or they may recommend the Pap and pelvic exam, combined with testing for human papilloma virus (HPV), every 5 years. Some types of HPV increase your risk of cervical cancer. Testing for HPV may also be done on women of any age with unclear Pap test results.  Other health care providers may not recommend any screening for nonpregnant women who are considered low risk for pelvic cancer and who do not have symptoms. Ask your health care provider if a screening pelvic exam is right for you.  If you have had past treatment for cervical cancer or a condition that could lead to cancer, you need Pap tests and screening for cancer for at least 20 years after your treatment. If Pap tests have been discontinued, your risk factors (such as having a new sexual partner) need to be reassessed to determine if screening should resume. Some women have medical problems that increase the chance of getting cervical cancer. In these cases, your health care provider may recommend more frequent screening and Pap tests. Colorectal Cancer  This type of cancer can be detected and often prevented.  Routine colorectal cancer screening usually begins at 72 years of age and continues through 72 years of age.  Your health care provider may recommend screening at an earlier age if you have risk factors for  colon cancer.  Your health care provider may also recommend using home test kits to check for hidden blood in the stool.  A small camera at the end of a tube can be used to examine your colon directly (sigmoidoscopy or colonoscopy). This is done to check for the earliest forms of colorectal cancer.  Routine screening usually begins at age 42.  Direct examination of the colon should be repeated every 5-10 years through 72 years of age. However, you may need to be screened more often if early forms of precancerous polyps or small growths are found. Skin Cancer  Check your skin from head to toe regularly.  Tell your health care provider about any new moles or changes in moles, especially if there is a change in a mole's shape or color.  Also tell your health care provider if you have a mole that is larger than the size of a  pencil eraser.  Always use sunscreen. Apply sunscreen liberally and repeatedly throughout the day.  Protect yourself by wearing long sleeves, pants, a wide-brimmed hat, and sunglasses whenever you are outside. HEART DISEASE, DIABETES, AND HIGH BLOOD PRESSURE   High blood pressure causes heart disease and increases the risk of stroke. High blood pressure is more likely to develop in:  People who have blood pressure in the high end of the normal range (130-139/85-89 mm Hg).  People who are overweight or obese.  People who are African American.  If you are 23-74 years of age, have your blood pressure checked every 3-5 years. If you are 45 years of age or older, have your blood pressure checked every year. You should have your blood pressure measured twice--once when you are at a hospital or clinic, and once when you are not at a hospital or clinic. Record the average of the two measurements. To check your blood pressure when you are not at a hospital or clinic, you can use:  An automated blood pressure machine at a pharmacy.  A home blood pressure monitor.  If you  are between 42 years and 82 years old, ask your health care provider if you should take aspirin to prevent strokes.  Have regular diabetes screenings. This involves taking a blood sample to check your fasting blood sugar level.  If you are at a normal weight and have a low risk for diabetes, have this test once every three years after 72 years of age.  If you are overweight and have a high risk for diabetes, consider being tested at a younger age or more often. PREVENTING INFECTION  Hepatitis B  If you have a higher risk for hepatitis B, you should be screened for this virus. You are considered at high risk for hepatitis B if:  You were born in a country where hepatitis B is common. Ask your health care provider which countries are considered high risk.  Your parents were born in a high-risk country, and you have not been immunized against hepatitis B (hepatitis B vaccine).  You have HIV or AIDS.  You use needles to inject street drugs.  You live with someone who has hepatitis B.  You have had sex with someone who has hepatitis B.  You get hemodialysis treatment.  You take certain medicines for conditions, including cancer, organ transplantation, and autoimmune conditions. Hepatitis C  Blood testing is recommended for:  Everyone born from [birth year] through 1967.  Anyone with known risk factors for hepatitis C. Sexually transmitted infections (STIs)  You should be screened for sexually transmitted infections (STIs) including gonorrhea and chlamydia if:  You are sexually active and are younger than 72 years of age.  You are older than 72 years of age and your health care provider tells you that you are at risk for this type of infection.  Your sexual activity has changed since you were last screened and you are at an increased risk for chlamydia or gonorrhea. Ask your health care provider if you are at risk.  If you do not have HIV, but are at risk, it may be recommended that you  take a prescription medicine daily to prevent HIV infection. This is called pre-exposure prophylaxis (PrEP). You are considered at risk if:  You are sexually active and do not regularly use condoms or know the HIV status of your partner(s).  You take drugs by injection.  You are sexually active with a partner who has HIV. Talk  with your health care provider about whether you are at high risk of being infected with HIV. If you choose to begin PrEP, you should first be tested for HIV. You should then be tested every 3 months for as long as you are taking PrEP.  PREGNANCY   If you are premenopausal and you may become pregnant, ask your health care provider about preconception counseling.  If you may become pregnant, take 400 to 800 micrograms (mcg) of folic acid every day.  If you want to prevent pregnancy, talk to your health care provider about birth control (contraception). OSTEOPOROSIS AND MENOPAUSE   Osteoporosis is a disease in which the bones lose minerals and strength with aging. This can result in serious bone fractures. Your risk for osteoporosis can be identified using a bone density scan.  If you are 71 years of age or older, or if you are at risk for osteoporosis and fractures, ask your health care provider if you should be screened.  Ask your health care provider whether you should take a calcium or vitamin D supplement to lower your risk for osteoporosis.  Menopause may have certain physical symptoms and risks.  Hormone replacement therapy may reduce some of these symptoms and risks. Talk to your health care provider about whether hormone replacement therapy is right for you.  HOME CARE INSTRUCTIONS   Schedule regular health, dental, and eye exams.  Stay current with your immunizations.   Do not use any tobacco products including cigarettes, chewing tobacco, or electronic cigarettes.  If you are pregnant, do not drink alcohol.  If you are breastfeeding, limit how  much and how often you drink alcohol.  Limit alcohol intake to no more than 1 drink per day for nonpregnant women. One drink equals 12 ounces of beer, 5 ounces of wine, or 1 ounces of hard liquor.  Do not use street drugs.  Do not share needles.  Ask your health care provider for help if you need support or information about quitting drugs.  Tell your health care provider if you often feel depressed.  Tell your health care provider if you have ever been abused or do not feel safe at home.   This information is not intended to replace advice given to you by your health care provider. Make sure you discuss any questions you have with your health care provider.   Document Released: 04/03/2011 Document Revised: 10/09/2014 Document Reviewed: 08/20/2013 Elsevier Interactive Patient Education [employer].

## 2015-12-17 DIAGNOSIS — Z1231 Encounter for screening mammogram for malignant neoplasm of breast: Secondary | ICD-10-CM | POA: Diagnosis not present

## 2015-12-17 DIAGNOSIS — R350 Frequency of micturition: Secondary | ICD-10-CM | POA: Diagnosis not present

## 2015-12-17 DIAGNOSIS — R300 Dysuria: Secondary | ICD-10-CM | POA: Diagnosis not present

## 2015-12-17 DIAGNOSIS — Z01419 Encounter for gynecological examination (general) (routine) without abnormal findings: Secondary | ICD-10-CM | POA: Diagnosis not present

## 2015-12-17 DIAGNOSIS — Z124 Encounter for screening for malignant neoplasm of cervix: Secondary | ICD-10-CM | POA: Diagnosis not present

## 2015-12-17 DIAGNOSIS — N8111 Cystocele, midline: Secondary | ICD-10-CM | POA: Diagnosis not present

## 2015-12-28 ENCOUNTER — Other Ambulatory Visit: Payer: Self-pay | Admitting: Obstetrics & Gynecology

## 2015-12-28 DIAGNOSIS — R928 Other abnormal and inconclusive findings on diagnostic imaging of breast: Secondary | ICD-10-CM

## 2015-12-29 ENCOUNTER — Ambulatory Visit
Admission: RE | Admit: 2015-12-29 | Discharge: 2015-12-29 | Disposition: A | Discharge status: Home / Self Care | Payer: Medicare Other | Source: Ambulatory Visit | Attending: Obstetrics & Gynecology | Admitting: Obstetrics & Gynecology

## 2015-12-29 DIAGNOSIS — R928 Other abnormal and inconclusive findings on diagnostic imaging of breast: Secondary | ICD-10-CM

## 2015-12-29 DIAGNOSIS — N6489 Other specified disorders of breast: Secondary | ICD-10-CM | POA: Diagnosis not present

## 2016-02-22 ENCOUNTER — Other Ambulatory Visit: Payer: Self-pay

## 2016-02-22 NOTE — Telephone Encounter (Signed)
Ok through next march when she is due for tsh again

## 2016-02-22 NOTE — Telephone Encounter (Signed)
Pt requesting a refill. Last filled on 10/01/15, TSH was checked on 12/01/15. Last OV 12/07/15. Okay to refill?

## 2016-02-25 MED ORDER — LEVOTHYROXINE SODIUM 88 MCG PO TABS: 88.0000 ug | ORAL_TABLET | Freq: Every day | ORAL | Status: DC

## 2016-02-25 NOTE — Telephone Encounter (Signed)
Noted, sent to pt's pharmacy.

## 2016-03-06 ENCOUNTER — Other Ambulatory Visit: Payer: Self-pay | Admitting: Internal Medicine

## 2016-03-06 MED ORDER — BUPROPION HCL ER (XL) 300 MG PO TB24: 300.0000 mg | ORAL_TABLET | Freq: Every morning | ORAL | Status: DC

## 2016-03-06 MED ORDER — DULOXETINE HCL 60 MG PO CPEP: 60.0000 mg | ORAL_CAPSULE | Freq: Every day | ORAL | Status: DC

## 2016-03-06 NOTE — Telephone Encounter (Signed)
Left message on voicemail Rx's sent to pharmacy as requested. 

## 2016-03-06 NOTE — Telephone Encounter (Signed)
Pt request refill  DULoxetine (CYMBALTA) 60 MG capsule buPROPion (WELLBUTRIN XL) 300 MG 24 hr tablet  Pt states she has been of town and ran out of her meds.  Pt has called the pharmacy several times, but I do not see an electronic request. Pt aware Dr Fabian Sharppanosh is out, but needs these meds asap.  (Pt has appointment with Dr Fabian SharpPanosh 6/16 and labs in the am) [facility] [address]

## 2016-03-07 ENCOUNTER — Other Ambulatory Visit (INDEPENDENT_AMBULATORY_CARE_PROVIDER_SITE_OTHER): Payer: Medicare Other

## 2016-03-07 DIAGNOSIS — E785 Hyperlipidemia, unspecified: Secondary | ICD-10-CM

## 2016-03-07 DIAGNOSIS — I10 Essential (primary) hypertension: Secondary | ICD-10-CM | POA: Diagnosis not present

## 2016-03-07 DIAGNOSIS — E119 Type 2 diabetes mellitus without complications: Secondary | ICD-10-CM

## 2016-03-07 DIAGNOSIS — Z0000 Encounter for general adult medical examination without abnormal findings: Secondary | ICD-10-CM

## 2016-03-07 LAB — BASIC METABOLIC PANEL
BUN: 15 mg/dL (ref 6–23)
CHLORIDE: 104 meq/L (ref 96–112)
CO2: 28 mEq/L (ref 19–32)
Calcium: 9.7 mg/dL (ref 8.4–10.5)
Creatinine, Ser: 0.62 mg/dL (ref 0.40–1.20)
GFR: 100.47 mL/min (ref >60.00)
GLUCOSE: 101 mg/dL — AB (ref 70–99)
POTASSIUM: 3.9 meq/L (ref 3.5–5.1)
SODIUM: 139 meq/L (ref 135–145)

## 2016-03-07 LAB — LIPID PANEL
CHOLESTEROL: 227 mg/dL — AB (ref 0–200)
HDL: 81.3 mg/dL (ref >39.00)
LDL CALC: 131 mg/dL — AB (ref 0–99)
NONHDL: 145.22
Total CHOL/HDL Ratio: 3
Triglycerides: 71 mg/dL (ref 0.0–149.0)
VLDL: 14.2 mg/dL (ref 0.0–40.0)

## 2016-03-07 LAB — HEMOGLOBIN A1C: HEMOGLOBIN A1C: 5.1 % (ref 4.6–6.5)

## 2016-03-14 ENCOUNTER — Ambulatory Visit (INDEPENDENT_AMBULATORY_CARE_PROVIDER_SITE_OTHER): Payer: Medicare Other | Admitting: Internal Medicine

## 2016-03-14 ENCOUNTER — Encounter: Payer: Self-pay | Admitting: Internal Medicine

## 2016-03-14 VITALS — BP 124/70 | HR 84 | Temp 98.2°F | Wt 157.3 lb

## 2016-03-14 DIAGNOSIS — E785 Hyperlipidemia, unspecified: Secondary | ICD-10-CM | POA: Diagnosis not present

## 2016-03-14 DIAGNOSIS — R05 Cough: Secondary | ICD-10-CM

## 2016-03-14 DIAGNOSIS — I10 Essential (primary) hypertension: Secondary | ICD-10-CM | POA: Diagnosis not present

## 2016-03-14 DIAGNOSIS — Z79899 Other long term (current) drug therapy: Secondary | ICD-10-CM

## 2016-03-14 DIAGNOSIS — E2839 Other primary ovarian failure: Secondary | ICD-10-CM

## 2016-03-14 DIAGNOSIS — R059 Cough, unspecified: Secondary | ICD-10-CM

## 2016-03-14 MED ORDER — LOSARTAN POTASSIUM 50 MG PO TABS: 50.0000 mg | ORAL_TABLET | Freq: Every day | ORAL | Status: DC

## 2016-03-14 NOTE — Progress Notes (Signed)
Pre visit review using our clinic review tool, if applicable. No additional management support is needed unless otherwise documented below in the visit note.  Chief Complaint  Patient presents with  . Follow-up    Needs to change lisinopril. Causing cough.    HPI: Elaine Howell 70 y.o.   Fu lipids ht and meds  L:IPIDS: add meds crestor no se of med BP ad add acei  Got cough nagging    Off for a week.  Said   Pulse was up.      Home aid from insurance co  ? Last dexa   No fx h  Wine  Tries not to buy it .    ROS: See pertinent positives and negatives per HPI. No cp sob  Syncope   Past Medical History  Diagnosis Date  . Depression   . Hypertension   . Hyperlipidemia   . Chronic insomnia   . Thyroiditis     positive antibodies  . Heavy alcohol use 07/27/2010    Qualifier: Diagnosis of  By: Fabian Sharp MD, Neta Mends Daily use       Family History  Problem Relation Age of Onset  . Heart disease Mother   . Alcohol abuse Father   . Heart disease Father   . Hypertension Sister     Social History   Social History  . Marital Status: Married    Spouse Name: N/A  . Number of Children: N/A  . Years of Education: N/A   Social History Main Topics  . Smoking status: Former Smoker    Start date: 07/24/1964    Quit date: 07/25/1999  . Smokeless tobacco: None  . Alcohol Use: 8.4 oz/week    14 Glasses of wine per week     Comment: heavy alcohol use  . Drug Use: No  . Sexual Activity: Not Asked   Other Topics Concern  . None   Social History Narrative      hh of 1   Part time [employer] english as a second language    Now retired    [facility] of 2 no pets   G2P2   Stop drinking wine one cocktail at night.   Reg exercise 6-7 x per week.     Outpatient Prescriptions Prior to Visit  Medication Sig Dispense Refill  . amLODipine (NORVASC) 10 MG tablet take 1 tablet by mouth once daily 90 tablet 2  . buPROPion (WELLBUTRIN XL) 300 MG 24 hr tablet Take 1 tablet (300 mg total)  by mouth every morning. 90 tablet 1  . DULoxetine (CYMBALTA) 60 MG capsule Take 1 capsule (60 mg total) by mouth daily. 90 capsule 1  . levothyroxine (SYNTHROID, LEVOTHROID) 88 MCG tablet Take 1 tablet (88 mcg total) by mouth daily. 90 tablet 9  . rosuvastatin (CRESTOR) 10 MG tablet Take 1 tablet (10 mg total) by mouth daily. 90 tablet 1  . lisinopril (PRINIVIL) 10 MG tablet Take 1 tablet (10 mg total) by mouth daily. For high blood pressure 90 tablet 1   No facility-administered medications prior to visit.     EXAM:  BP 124/70 mmHg  Pulse 84  Temp(Src) 98.2 F (36.8 C) (Oral)  Wt 157 lb 4.8 oz (71.351 kg)  Body mass index is 27.87 kg/(m^2).  GENERAL: vitals reviewed and listed above, alert, oriented, appears well hydrated and in no acute distress HEENT: atraumatic, conjunctiva  clear, no obvious abnormalities on inspection of external nose and ears  NECK: no obvious masses  on inspection palpation  LUNGS: clear to auscultation bilaterally, no wheezes, rales or rhonchi, good air movement CV: HRRR, no clubbing cyanosis or  peripheral edema nl cap refill  MS: moves all extremities without noticeable focal  abnormality PSYCH: pleasant and cooperative, no obvious depression or anxiety BP Readings from Last 3 Encounters:  03/14/16 124/70  12/07/15 148/88  03/12/15 138/80   Wt Readings from Last 3 Encounters:  03/14/16 157 lb 4.8 oz (71.351 kg)  12/07/15 161 lb (73.029 kg)  01/19/15 161 lb 4.8 oz (73.165 kg)   Lab Results  Component Value Date   WBC 4.3 12/01/2015   HGB 13.8 12/01/2015   HCT 39.6 12/01/2015   PLT 376.0 12/01/2015   GLUCOSE 101* 03/07/2016   CHOL 227* 03/07/2016   TRIG 71.0 03/07/2016   HDL 81.30 03/07/2016   LDLDIRECT 146.3 09/17/2013   LDLCALC 131* 03/07/2016   ALT 30 12/01/2015   AST 28 12/01/2015   NA 139 03/07/2016   K 3.9 03/07/2016   CL 104 03/07/2016   CREATININE 0.62 03/07/2016   BUN 15 03/07/2016   CO2 28 03/07/2016   TSH 4.33 12/01/2015    HGBA1C 5.1 03/07/2016   revewied labs   ASSESSMENT AND PLAN:  Discussed the following assessment and plan:  HLD (hyperlipidemia) - much improved  conmtinue crestor  Essential hypertension - better onacei also  but cough change to arb rov 3 months  Medication management  Estrogen deficiency - last dexa normal  2010 repeat - Plan: DG Bone Density  Cough - prob from acei Lipids much improved 312 2-27. LDL 191-131. Chemistry stable A1c nondiabetic. ROV in 3 months to review change med cough and  dexa  Or as needed  Total visit 25mins > 50% spent counseling and coordinating care as indicated in above note and in instructions to patient .  Pulse seems normal today Patient advised to return or notify health care team  if symptoms worsen ,persist or new concerns arise.  Patient Instructions  Cholesterol level so much better. Down almost 100 points. Stay  On the crestor  Continue.  Change bp medication   To see if cough goes away.  Cozaar. 50 mg per day  To be sent  In to your pharmacy  For high.  bp pressure .  Get appt for bone density  ( last on 2010 ok then ) .  ROV in about 3 months for BP  And dexa scan and cough fu.        Neta MendsWanda K. Panosh M.D.

## 2016-03-14 NOTE — Patient Instructions (Addendum)
Cholesterol level so much better. Down almost 100 points. Stay  On the crestor  Continue.  Change bp medication   To see if cough goes away.  Cozaar. 50 mg per day  To be sent  In to your pharmacy  For high.  bp pressure .  Get appt for bone density  ( last on 2010 ok then ) .  ROV in about 3 months for BP  And dexa scan and cough fu.

## 2016-05-30 ENCOUNTER — Other Ambulatory Visit: Payer: Self-pay | Admitting: Internal Medicine

## 2016-05-31 NOTE — Telephone Encounter (Signed)
Sent to the pharmacy by e-scribe. 

## 2016-06-06 ENCOUNTER — Other Ambulatory Visit: Payer: Self-pay | Admitting: Internal Medicine

## 2016-06-06 DIAGNOSIS — N6320 Unspecified lump in the left breast, unspecified quadrant: Secondary | ICD-10-CM

## 2016-06-09 ENCOUNTER — Other Ambulatory Visit: Payer: Medicare Other

## 2016-06-11 ENCOUNTER — Other Ambulatory Visit: Payer: Self-pay | Admitting: Internal Medicine

## 2016-06-13 NOTE — Telephone Encounter (Signed)
Sent to the pharmacy by e-scribe for 90 days.  Pt has upcoming appt on 06/23/16 for follow up of bp.

## 2016-06-23 ENCOUNTER — Ambulatory Visit (INDEPENDENT_AMBULATORY_CARE_PROVIDER_SITE_OTHER): Payer: Medicare Other | Admitting: Internal Medicine

## 2016-06-23 ENCOUNTER — Encounter: Payer: Self-pay | Admitting: Internal Medicine

## 2016-06-23 VITALS — BP 120/76 | Temp 98.6°F | Ht <= 58 in | Wt 158.3 lb

## 2016-06-23 DIAGNOSIS — R05 Cough: Secondary | ICD-10-CM | POA: Diagnosis not present

## 2016-06-23 DIAGNOSIS — I10 Essential (primary) hypertension: Secondary | ICD-10-CM | POA: Diagnosis not present

## 2016-06-23 DIAGNOSIS — Z79899 Other long term (current) drug therapy: Secondary | ICD-10-CM | POA: Diagnosis not present

## 2016-06-23 DIAGNOSIS — R058 Other specified cough: Secondary | ICD-10-CM

## 2016-06-23 DIAGNOSIS — T464X5A Adverse effect of angiotensin-converting-enzyme inhibitors, initial encounter: Secondary | ICD-10-CM

## 2016-06-23 DIAGNOSIS — Z23 Encounter for immunization: Secondary | ICD-10-CM | POA: Diagnosis not present

## 2016-06-23 NOTE — Patient Instructions (Addendum)
Glad your blood pressures doing well. Stay on same medicine. Don't forget to get your bone density.  Appointment  Order is already in the system for [place] .    cpx and labs  March 2018  Or as needed

## 2016-06-23 NOTE — Progress Notes (Signed)
Chief Complaint  Patient presents with  . Follow-up    HPI: Elaine Howell 70 y.o.  Fu change bp med casue of poss ace cough   bp is ok and cough I gone   She has stoppe etoh much because sis has developed AF and needed CV .      Bu didn't get dexa  Yet Had eval for breast cyst and needs fu [place] in 6 months  Doing well otherwise  ROS: See pertinent positives and negatives per HPI.  Past Medical History:  Diagnosis Date  . Chronic insomnia   . Depression   . Heavy alcohol use 07/27/2010   Qualifier: Diagnosis of  By: Fabian Sharp MD, Elaine Howell Daily use     . Hyperlipidemia   . Hypertension   . Thyroiditis    positive antibodies    Family History  Problem Relation Age of Onset  . Heart disease Mother   . Alcohol abuse Father   . Heart disease Father   . Hypertension Sister     Social History   Social History  . Marital status: Married    Spouse name: N/A  . Number of children: N/A  . Years of education: N/A   Social History Main Topics  . Smoking status: Former Smoker    Start date: 07/24/1964    Quit date: 07/25/1999  . Smokeless tobacco: None  . Alcohol use 8.4 oz/week    14 Glasses of wine per week     Comment: heavy alcohol use  . Drug use: No  . Sexual activity: Not Asked   Other Topics Concern  . None   Social History Narrative      hh of 1   Part time [employer] english as a second language    Now retired    [facility] [employer] [employer] [facility] of 2 no pets   G2P2   Stop drinking wine one cocktail at night.   Reg exercise 6-7 x per week.     Outpatient Medications Prior to Visit  Medication Sig Dispense Refill  . amLODipine (NORVASC) 10 MG tablet TAKE 1 TABLET BY MOUTH ONCE DAILY 90 tablet 0  . buPROPion (WELLBUTRIN XL) 300 MG 24 hr tablet Take 1 tablet (300 mg total) by mouth every morning. 90 tablet 1  . DULoxetine (CYMBALTA) 60 MG capsule Take 1 capsule (60 mg total) by mouth daily. 90 capsule 1  . levothyroxine (SYNTHROID, LEVOTHROID) 88 MCG tablet Take 1 tablet (88 mcg  total) by mouth daily. 90 tablet 9  . losartan (COZAAR) 50 MG tablet Take 1 tablet (50 mg total) by mouth daily. 90 tablet 3  . rosuvastatin (CRESTOR) 10 MG tablet TAKE 1 TABLET BY MOUTH DAILY 90 tablet 1   No facility-administered medications prior to visit.      EXAM:  BP 120/76 (BP Location: Right Arm, Patient Position: Sitting, Cuff Size: Normal)   Temp 98.6 F (37 C) (Oral)   Ht 3\' 4"  (1.016 m)   Wt 158 lb 4.8 oz (71.8 kg)   BMI 69.56 kg/m   Body mass index is 69.56 kg/m.  GENERAL: vitals reviewed and listed above, alert, oriented, appears well hydrated and in no acute distress HEENT: atraumatic, conjunctiva  clear, no obvious abnormalities on inspection of external nose and ears NECK: no obvious masses on inspection palpation  CV: HRRR, no clubbing cyanosis or  peripheral edema nl cap refill  MS: moves all extremities without noticeable focal  abnormality PSYCH: pleasant and cooperative, no obvious depression  or anxiety Lab Results  Component Value Date   WBC 4.3 12/01/2015   HGB 13.8 12/01/2015   HCT 39.6 12/01/2015   PLT 376.0 12/01/2015   GLUCOSE 101 (H) 03/07/2016   CHOL 227 (H) 03/07/2016   TRIG 71.0 03/07/2016   HDL 81.30 03/07/2016   LDLDIRECT 146.3 09/17/2013   LDLCALC 131 (H) 03/07/2016   ALT 30 12/01/2015   AST 28 12/01/2015   NA 139 03/07/2016   K 3.9 03/07/2016   CL 104 03/07/2016   CREATININE 0.62 03/07/2016   BUN 15 03/07/2016   CO2 28 03/07/2016   TSH 4.33 12/01/2015   HGBA1C 5.1 03/07/2016   BP Readings from Last 3 Encounters:  06/23/16 120/76  03/14/16 124/70  12/07/15 (!) 148/88   Wt Readings from Last 3 Encounters:  06/23/16 158 lb 4.8 oz (71.8 kg)  03/14/16 157 lb 4.8 oz (71.4 kg)  12/07/15 161 lb (73 kg)    ASSESSMENT AND PLAN:  Discussed the following assessment and plan:  Essential hypertension  Need for prophylactic vaccination and inoculation against influenza - Plan: Flu Vaccine QUAD 36+ mos PF IM (Fluarix & Fluzone  Quad PF)  ACE-inhibitor cough - lisinopril better on cozaar  Medication management Can get labs at the visit in March  -Patient advised to return or notify health care team  if symptoms worsen ,persist or new concerns arise.  Patient Instructions  Glad your blood pressures doing well. Stay on same medicine. Don't forget to get your bone density.  Appointment  Order is already in the system for [place] .    cpx and labs  March 2018  Or as needed     Elaine MendsWanda K. Howell Rath M.D.

## 2016-07-01 ENCOUNTER — Other Ambulatory Visit: Payer: Self-pay | Admitting: Internal Medicine

## 2016-07-03 ENCOUNTER — Other Ambulatory Visit: Payer: Medicare Other

## 2016-07-04 ENCOUNTER — Ambulatory Visit (INDEPENDENT_AMBULATORY_CARE_PROVIDER_SITE_OTHER)
Admission: RE | Admit: 2016-07-04 | Discharge: 2016-07-04 | Disposition: A | Discharge status: Home / Self Care | Payer: Medicare Other | Source: Ambulatory Visit

## 2016-07-04 DIAGNOSIS — E2839 Other primary ovarian failure: Secondary | ICD-10-CM | POA: Diagnosis not present

## 2016-07-05 ENCOUNTER — Ambulatory Visit
Admission: RE | Admit: 2016-07-05 | Discharge: 2016-07-05 | Disposition: A | Discharge status: Home / Self Care | Payer: Medicare Other | Source: Ambulatory Visit | Attending: Internal Medicine | Admitting: Internal Medicine

## 2016-07-05 DIAGNOSIS — N6320 Unspecified lump in the left breast, unspecified quadrant: Secondary | ICD-10-CM | POA: Diagnosis not present

## 2016-07-05 DIAGNOSIS — R928 Other abnormal and inconclusive findings on diagnostic imaging of breast: Secondary | ICD-10-CM | POA: Diagnosis not present

## 2016-07-05 DIAGNOSIS — N6002 Solitary cyst of left breast: Secondary | ICD-10-CM | POA: Diagnosis not present

## 2016-10-05 DIAGNOSIS — J069 Acute upper respiratory infection, unspecified: Secondary | ICD-10-CM | POA: Diagnosis not present

## 2016-10-07 ENCOUNTER — Other Ambulatory Visit: Payer: Self-pay | Admitting: Internal Medicine

## 2016-10-10 NOTE — Telephone Encounter (Signed)
Sent to the pharmacy by e-scribe for 3 months.  Pt has upcoming yearly on 12/25/16.

## 2016-12-01 ENCOUNTER — Other Ambulatory Visit: Payer: Self-pay | Admitting: Internal Medicine

## 2016-12-25 ENCOUNTER — Encounter: Payer: Medicare Other | Admitting: Internal Medicine

## 2016-12-25 NOTE — Progress Notes (Deleted)
No chief complaint on file.   HPI: Elaine Howell 71 y.o. comes in today for Preventive Medicare  visit .  Chronic disease management Since last visit. HT THyroid  Health Maintenance  Topic Date Due  . FOOT EXAM  11/03/1955  . OPHTHALMOLOGY EXAM  11/03/1955  . MAMMOGRAM  01/01/2011  . COLONOSCOPY  05/16/2015  . HEMOGLOBIN A1C  09/06/2016  . TETANUS/TDAP  10/20/2024  . INFLUENZA VACCINE  Addressed  . DEXA SCAN  Completed  . Hepatitis C Screening  Completed  . PNA vac Low Risk Adult  Completed   Health Maintenance Review LIFESTYLE:  TAD Sugar beverages: Sleep:    MEDICARE DOCUMENT QUESTIONS  TO SCAN     Hearing:   Vision:  No limitations at present . Last eye check UTD  Safety:  Has smoke detector and wears seat belts.  No firearms. No excess sun exposure. Sees dentist regularly.  Falls:   Advance directive :  Reviewed  Has one.  Memory: Felt to be good  , no concern from her or her family.  Depression: No anhedonia unusual crying or depressive symptoms  Nutrition: Eats well balanced diet; adequate calcium and vitamin D. No swallowing chewing problems.  Injury: no major injuries in the last six months.  Other healthcare providers:  Reviewed today .  Social:  Lives with spouse married. No pets.   Preventive parameters: up-to-date  Reviewed   ADLS:   There are no problems or need for assistance  driving, feeding, obtaining food, dressing, toileting and bathing, managing money using phone. She is independent.  EXERCISE/ HABITS  Per week   No tobacco    etoh   ROS:  GEN/ HEENT: No fever, significant weight changes sweats headaches vision problems hearing changes, CV/ PULM; No chest pain shortness of breath cough, syncope,edema  change in exercise tolerance. GI /GU: No adominal pain, vomiting, change in bowel habits. No blood in the stool. No significant GU symptoms. SKIN/HEME: ,no acute skin rashes suspicious lesions or bleeding. No  lymphadenopathy, nodules, masses.  NEURO/ PSYCH:  No neurologic signs such as weakness numbness. No depression anxiety. IMM/ Allergy: No unusual infections.  Allergy .   REST of 12 system review negative except as per HPI   Past Medical History:  Diagnosis Date  . Chronic insomnia   . Depression   . Heavy alcohol use 07/27/2010   Qualifier: Diagnosis of  By: Fabian SharpPanosh MD, Neta MendsWanda K Daily use     . Hyperlipidemia   . Hypertension   . Thyroiditis    positive antibodies    Family History  Problem Relation Age of Onset  . Heart disease Mother   . Alcohol abuse Father   . Heart disease Father   . Hypertension Sister     Social History   Social History  . Marital status: Married    Spouse name: N/A  . Number of children: N/A  . Years of education: N/A   Social History Main Topics  . Smoking status: Former Smoker    Start date: 07/24/1964    Quit date: 07/25/1999  . Smokeless tobacco: Not on file  . Alcohol use 8.4 oz/week    14 Glasses of wine per week     Comment: heavy alcohol use  . Drug use: No  . Sexual activity: Not on file   Other Topics Concern  . 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   Reg exercise 6-7 x per week.     Outpatient Encounter Prescriptions as of 12/25/2016  Medication Sig  . amLODipine (NORVASC) 10 MG tablet TAKE 1 TABLET BY MOUTH ONCE DAILY  . amLODipine (NORVASC) 10 MG tablet TAKE 1 TABLET BY MOUTH ONCE DAILY  . buPROPion (WELLBUTRIN XL) 300 MG 24 hr tablet take 1 tablet by mouth every morning  . DULoxetine (CYMBALTA) 60 MG capsule take 1 capsule by mouth once daily  . levothyroxine (SYNTHROID, LEVOTHROID) 88 MCG tablet Take 1 tablet (88 mcg total) by mouth daily.  Marland Kitchen losartan (COZAAR) 50 MG tablet Take 1 tablet (50 mg total) by mouth daily.  . rosuvastatin (CRESTOR) 10 MG tablet take 1 tablet by  mouth once daily   No facility-administered encounter medications on file as of 12/25/2016.     EXAM:  There were no vitals taken for this visit.  There is no height or weight on file to calculate BMI.  Physical Exam: Vital signs reviewed [id number] is a well-developed well-nourished alert cooperative   who appears stated age in no acute distress.  HEENT: normocephalic atraumatic , Eyes: PERRL EOM's full, conjunctiva clear, Nares: paten,t no deformity discharge or tenderness., Ears: no deformity EAC's clear TMs with normal landmarks. Mouth: clear OP, no lesions, edema.  Moist mucous membranes. Dentition in adequate repair. NECK: supple without masses, thyromegaly or bruits. CHEST/PULM:  Clear to auscultation and percussion breath sounds equal no wheeze , rales or rhonchi. No chest wall deformities or tenderness. CV: PMI is nondisplaced, S1 S2 no gallops, murmurs, rubs. Peripheral pulses are full without delay.No JVD .  ABDOMEN: Bowel sounds normal nontender  No guard or rebound, no hepato splenomegal no CVA tenderness.   Extremtities:  No clubbing cyanosis or edema, no acute joint swelling or redness no focal atrophy NEURO:  Oriented x3, cranial nerves 3-12 appear to be intact, no obvious focal weakness,gait within normal limits no abnormal reflexes or asymmetrical SKIN: No acute rashes normal turgor, color, no bruising or petechiae. PSYCH: Oriented, good eye contact, no obvious depression anxiety, cognition and judgment appear normal. LN: no cervical axillary inguinal adenopathy No noted deficits in memory, attention, and speech.   Lab Results  Component Value Date   WBC 4.3 12/01/2015   HGB 13.8 12/01/2015   HCT 39.6 12/01/2015   PLT 376.0 12/01/2015   GLUCOSE 101 (H) 03/07/2016   CHOL 227 (H) 03/07/2016   TRIG 71.0 03/07/2016   HDL 81.30 03/07/2016   LDLDIRECT 146.3 09/17/2013   LDLCALC 131 (H) 03/07/2016   ALT 30 12/01/2015   AST 28 12/01/2015   NA 139 03/07/2016   K 3.9  03/07/2016   CL 104 03/07/2016   CREATININE 0.62 03/07/2016   BUN 15 03/07/2016   CO2 28 03/07/2016   TSH 4.33 12/01/2015   HGBA1C 5.1 03/07/2016    ASSESSMENT AND PLAN:  Discussed the following assessment and plan:  No diagnosis found.  Patient Care Team: Madelin Headings, MD as PCP - General Jethro Bolus, MD as Attending Physician (Ophthalmology) Shea Evans, MD as Attending Physician (Obstetrics and Gynecology)  There are no Patient Instructions on file for this visit.  Neta Mends. Panosh M.D.

## 2017-01-23 ENCOUNTER — Other Ambulatory Visit: Payer: Self-pay | Admitting: Internal Medicine

## 2017-03-10 ENCOUNTER — Other Ambulatory Visit: Payer: Self-pay | Admitting: Internal Medicine

## 2017-03-13 NOTE — Progress Notes (Signed)
Chief Complaint  Patient presents with  . Annual Exam    HPI: Elaine Howell 71 y.o. comes in today for Preventive Medicare  visit .Since last visit. 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 Marland Kitchen Usually a late night sleeper and doesn t do well if doesn get her sleep asks for sleep aid .  Bp seems ok on meds LIPIDS Depression seems ok on meds no noted se  Thyroid  Taking med no changes  Had breast nodule beniogn ? whne to get next mammo?   Health Maintenance  Topic Date Due  . FOOT EXAM  11/03/1955  . OPHTHALMOLOGY EXAM  11/03/1955  . MAMMOGRAM  01/01/2011  . COLONOSCOPY  05/16/2015  . HEMOGLOBIN A1C  09/06/2016  . INFLUENZA VACCINE  05/02/2017  . TETANUS/TDAP  10/20/2024  . DEXA SCAN  Completed  . Hepatitis C Screening  Completed  . PNA vac Low Risk Adult  Completed   Health Maintenance Review LIFESTYLE:  Exercise:  Walking some Tobacco/ETS:n Alcohol:  challendgin dec   Sugar beverages:n Sleep:10-10-08 am  Drug use: no H [facility] 1     Hearing:  Dec hearing uses aids   Vision:  No limitations at present . Last eye check UTD  Safety:  Has smoke detector and wears seat belts.  No firearms. No excess sun exposure. Sees dentist regularly.  Falls: n  Memory: Felt to be good  , no concern from her or her family.  Depression: No anhedonia unusual crying or depressive symptoms  Nutrition: Eats well balanced diet; adequate calcium and vitamin D. No swallowing chewing problems.  Injury: no major injuries in the last six months.  Other healthcare providers:  Reviewed today .  Social:  alone No pets.  Family in [place]  Preventive parameters: up-to-date  Reviewed   ADLS:   There are no problems or need for assistance  driving, feeding, obtaining food, dressing, toileting and bathing, managing money using phone. She is  independent.   ROS:  GEN/ HEENT: No fever, significant weight changes sweats headaches vision problems hearing changes, CV/ PULM; No chest pain shortness of breath cough, syncope,edema  change in exercise tolerance. GI /GU: No adominal pain, vomiting, change in bowel habits. No blood in the stool. No significant GU symptoms. SKIN/HEME: ,no acute skin rashes suspicious lesions or bleeding. No lymphadenopathy, nodules, masses.  NEURO/ PSYCH:  No neurologic signs such as weakness numbness. No aggravation of depression anxiety. IMM/ Allergy: No unusual infections.  Allergy .   REST of 12 system review negative except as per HPI   Past Medical History:  Diagnosis Date  . Chronic insomnia   . Depression   . Heavy alcohol use 07/27/2010   Qualifier: Diagnosis of  By: Regis Bill MD, Standley Brooking Daily use     . Hyperlipidemia   . Hypertension   . Thyroiditis    positive antibodies    Family History  Problem Relation Age of Onset  . Heart disease Mother   . Alcohol abuse Father   . Heart disease Father   . Hypertension Sister    Sis   AFib and ablation   Social History   Social History  . Marital status: Married    Spouse name: N/A  . Number of children: N/A  . Years of education:  N/A   Social History Main Topics  . Smoking status: Former Smoker    Start date: 07/24/1964    Quit date: 07/25/1999  . Smokeless tobacco: Never Used  . Alcohol use 8.4 oz/week    14 Glasses of wine per week     Comment: heavy alcohol use  . Drug use: No  . Sexual activity: Not Asked   Other Topics Concern  . 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   Reg exercise 6-7 x per week.     Outpatient Encounter Prescriptions as of 03/14/2017  Medication Sig  . amLODipine (NORVASC) 10 MG tablet TAKE 1 TABLET BY MOUTH ONCE DAILY  . buPROPion (WELLBUTRIN XL) 300 MG 24  hr tablet TAKE 1 TABLET BY MOUTH EVERY MORNING  . DULoxetine (CYMBALTA) 60 MG capsule TAKE 1 CAPSULE BY MOUTH ONCE DAILY  . levothyroxine (SYNTHROID, LEVOTHROID) 88 MCG tablet Take 1 tablet (88 mcg total) by mouth daily.  Marland Kitchen losartan (COZAAR) 50 MG tablet TAKE 1 TABLET BY MOUTH DAILY  . rosuvastatin (CRESTOR) 10 MG tablet take 1 tablet by mouth once daily  . zolpidem (AMBIEN) 5 MG tablet Take 1 tablet (5 mg total) by mouth at bedtime as needed for sleep. For travel . No more than 3 nights in a row  . [DISCONTINUED] amLODipine (NORVASC) 10 MG tablet TAKE 1 TABLET BY MOUTH ONCE DAILY   No facility-administered encounter medications on file as of 03/14/2017.     EXAM:  BP 118/80 (BP Location: Right Arm, Patient Position: Sitting, Cuff Size: Normal)   Pulse 80   Temp 97.9 F (36.6 C) (Oral)   Ht _0  (1.6 m)   Wt 160 lb 3.2 oz (72.7 kg)   BMI 28.38 kg/m   Body mass index is 28.38 kg/m.  Physical Exam: Vital signs reviewed [id number] is a well-developed well-nourished alert cooperative   who appears stated age in no acute distress.  HEENT: normocephalic atraumatic , Eyes: PERRL EOM's full, conjunctiva clear, Nares: paten,t no deformity discharge or tenderness., Ears: no deformity EAC's clear TMs with normal landmarks. Mouth: clear OP, no lesions, edema.  Moist mucous membranes. Dentition in adequate repair. NECK: supple without masses, thyromegaly or bruits. CHEST/PULM:  Clear to auscultation and percussion breath sounds equal no wheeze , rales or rhonchi. No chest wall deformities or tenderness.Breast: normal by inspection . No dimpling, discharge, masses, tenderness or discharge . CV: PMI is nondisplaced, S1 S2 no gallops, murmurs, rubs. Peripheral pulses are full without delay.No JVD .  ABDOMEN: Bowel sounds normal nontender  No guard or rebound, no hepato splenomegal no CVA tenderness.   Extremtities:  No clubbing cyanosis or edema, no acute joint swelling or redness no focal  atrophy NEURO:  Oriented x3, cranial nerves 3-12 appear to be intact, no obvious focal weakness,gait within normal limits no abnormal reflexes or asymmetrical SKIN: No acute rashes normal turgor, color, no bruising or petechiae. PSYCH: Oriented, good eye contact, no obvious depression anxiety, cognition and judgment appear normal. LN: no cervical axillary inguinal adenopathy No noted deficits in memory, attention, and speech.    BP Readings from Last 3 Encounters:  03/14/17 118/80  06/23/16 120/76  03/14/16 124/70   Wt Readings from Last 3 Encounters:  03/14/17 160 lb  3.2 oz (72.7 kg)  06/23/16 158 lb 4.8 oz (71.8 kg)  03/14/16 157 lb 4.8 oz (71.4 kg)     ASSESSMENT AND PLAN:  Discussed the following assessment and plan:  Visit for preventive health examination - Plan: TSH, Hepatic function panel, Hemoglobin A1c, CBC with Differential/Platelet, Basic metabolic panel, Lipid panel  Essential hypertension - Plan: TSH, Hepatic function panel, Hemoglobin A1c, CBC with Differential/Platelet, Basic metabolic panel, Lipid panel  Medication management - Plan: TSH, Hepatic function panel, Hemoglobin A1c, CBC with Differential/Platelet, Basic metabolic panel, Lipid panel  Hyperlipidemia, unspecified hyperlipidemia type - Plan: TSH, Hepatic function panel, Hemoglobin A1c, CBC with Differential/Platelet, Basic metabolic panel, Lipid panel  THYROIDITIS NOS - Plan: TSH, Hepatic function panel, Hemoglobin A1c, CBC with Differential/Platelet, Basic metabolic panel, Lipid panel  Hypothyroidism, unspecified type - Plan: TSH, Hepatic function panel, Hemoglobin A1c, CBC with Differential/Platelet, Basic metabolic panel, Lipid panel  Wears hearing aid  Depression, unspecified depression type  Counseling about travel - sleep cycling cautious use of med reviewed data and mammo when due   Caution owth sleep aids  Given ambien instead of alpraz for sleep cycling  Pt had been  Dependant on med in  past  When working [employer].  Patient Care Team: Burnis Medin, MD as PCP - General Rutherford Guys, MD as Attending Physician (Ophthalmology) Azucena Fallen, MD as Attending Physician (Obstetrics and Gynecology)  Patient Instructions   Continue working on  lifestyle intervention healthy eating and exercise .  Limiting alcohol  Can use  ambien or lunesta  For  Travel sleep but without   Alcohol do not use more than 3 days in a row . To avoid  Dependency   Will notify you  of labs when available.  Depending on results   Plan ROV 6 months med check   Routine mammogram due .    IMPRESSION: No significant change in a small, benign, complicated left breast cyst. No evidence of malignancy.  RECOMMENDATION: Bilateral screening mammogram in 6 months. That will be 1 year since mammographic evaluation of the right breast.  I have discussed the findings and recommendations with the patient. Results were also provided in writing at the conclusion of the visit. If applicable, a reminder letter will be sent to the patient regarding the next appointment.  BI-RADS CATEGORY  2: Benign.   Electronically Signed   By: Claudie Revering M.D.   On: 07/05/2016 16:36   Preventive Care 65 Years and Older, Female Preventive care refers to lifestyle choices and visits with your health care provider that can promote health and wellness. What does preventive care include?  A yearly physical exam. This is also called an annual well check.  Dental exams once or twice a year.  Routine eye exams. Ask your health care provider how often you should have your eyes checked.  Personal lifestyle choices, including: ? Daily care of your teeth and gums. ? Regular physical activity. ? Eating a healthy diet. ? Avoiding tobacco and drug use. ? Limiting alcohol use. ? Practicing safe sex. ? Taking low-dose aspirin every day. ? Taking vitamin and mineral supplements as recommended by your health care  provider. What happens during an annual well check? The services and screenings done by your health care provider during your annual well check will depend on your age, overall health, lifestyle risk factors, and family history of disease. Counseling Your health care provider may ask you questions about your:  Alcohol use.  Tobacco use.  Drug use.  Emotional well-being.  Home and relationship well-being.  Sexual activity.  Eating habits.  History of falls.  Memory and ability to understand (cognition).  Work and work Statistician.  Reproductive health.  Screening You may have the following tests or measurements:  Height, weight, and BMI.  Blood pressure.  Lipid and cholesterol levels. These may be checked every 5 years, or more frequently if you are over 66 years old.  Skin check.  Lung cancer screening. You may have this screening every year starting at age 76 if you have a 30-pack-year history of smoking and currently smoke or have quit within the past 15 years.  Fecal occult blood test (FOBT) of the stool. You may have this test every year starting at age 59.  Flexible sigmoidoscopy or colonoscopy. You may have a sigmoidoscopy every 5 years or a colonoscopy every 10 years starting at age 64.  Hepatitis C blood test.  Hepatitis B blood test.  Sexually transmitted disease (STD) testing.  Diabetes screening. This is done by checking your blood sugar (glucose) after you have not eaten for a while (fasting). You may have this done every 1-3 years.  Bone density scan. This is done to screen for osteoporosis. You may have this done starting at age 6.  Mammogram. This may be done every 1-2 years. Talk to your health care provider about how often you should have regular mammograms.  Talk with your health care provider about your test results, treatment options, and if necessary, the need for more tests. Vaccines Your health care provider may recommend certain  vaccines, such as:  Influenza vaccine. This is recommended every year.  Tetanus, diphtheria, and acellular pertussis (Tdap, Td) vaccine. You may need a Td booster every 10 years.  Varicella vaccine. You may need this if you have not been vaccinated.  Zoster vaccine. You may need this after age 45.  Measles, mumps, and rubella (MMR) vaccine. You may need at least one dose of MMR if you were born in 1957 or later. You may also need a second dose.  Pneumococcal 13-valent conjugate (PCV13) vaccine. One dose is recommended after age 44.  Pneumococcal polysaccharide (PPSV23) vaccine. One dose is recommended after age 36.  Meningococcal vaccine. You may need this if you have certain conditions.  Hepatitis A vaccine. You may need this if you have certain conditions or if you travel or work in places where you may be exposed to hepatitis A.  Hepatitis B vaccine. You may need this if you have certain conditions or if you travel or work in places where you may be exposed to hepatitis B.  Haemophilus influenzae type b (Hib) vaccine. You may need this if you have certain conditions.  Talk to your health care provider about which screenings and vaccines you need and how often you need them. This information is not intended to replace advice given to you by your health care provider. Make sure you discuss any questions you have with your health care provider. Document Released: 10/15/2015 Document Revised: 06/07/2016 Document Reviewed: 07/20/2015 Elsevier Interactive Patient Education  2017 [place]. Maylani Embree M.D.

## 2017-03-14 ENCOUNTER — Ambulatory Visit (INDEPENDENT_AMBULATORY_CARE_PROVIDER_SITE_OTHER): Payer: Medicare Other | Admitting: Internal Medicine

## 2017-03-14 ENCOUNTER — Encounter: Payer: Self-pay | Admitting: Internal Medicine

## 2017-03-14 VITALS — BP 118/80 | HR 80 | Temp 97.9°F | Ht 63.0 in | Wt 160.2 lb

## 2017-03-14 DIAGNOSIS — I10 Essential (primary) hypertension: Secondary | ICD-10-CM

## 2017-03-14 DIAGNOSIS — Z974 Presence of external hearing-aid: Secondary | ICD-10-CM

## 2017-03-14 DIAGNOSIS — E785 Hyperlipidemia, unspecified: Secondary | ICD-10-CM | POA: Diagnosis not present

## 2017-03-14 DIAGNOSIS — Z79899 Other long term (current) drug therapy: Secondary | ICD-10-CM

## 2017-03-14 DIAGNOSIS — F329 Major depressive disorder, single episode, unspecified: Secondary | ICD-10-CM | POA: Diagnosis not present

## 2017-03-14 DIAGNOSIS — Z7189 Other specified counseling: Secondary | ICD-10-CM

## 2017-03-14 DIAGNOSIS — E069 Thyroiditis, unspecified: Secondary | ICD-10-CM | POA: Diagnosis not present

## 2017-03-14 DIAGNOSIS — Z7184 Encounter for health counseling related to travel: Secondary | ICD-10-CM

## 2017-03-14 DIAGNOSIS — Z0000 Encounter for general adult medical examination without abnormal findings: Secondary | ICD-10-CM | POA: Diagnosis not present

## 2017-03-14 DIAGNOSIS — E039 Hypothyroidism, unspecified: Secondary | ICD-10-CM

## 2017-03-14 DIAGNOSIS — F32A Depression, unspecified: Secondary | ICD-10-CM

## 2017-03-14 LAB — LIPID PANEL
CHOL/HDL RATIO: 2
Cholesterol: 218 mg/dL — ABNORMAL HIGH (ref 0–200)
HDL: 101.5 mg/dL (ref >39.00)
LDL Cholesterol: 103 mg/dL — ABNORMAL HIGH (ref 0–99)
NONHDL: 116.03
Triglycerides: 67 mg/dL (ref 0.0–149.0)
VLDL: 13.4 mg/dL (ref 0.0–40.0)

## 2017-03-14 LAB — CBC WITH DIFFERENTIAL/PLATELET
Basophils Absolute: 0 10*3/uL (ref 0.0–0.1)
Basophils Relative: 0.8 % (ref 0.0–3.0)
EOS PCT: 2.5 % (ref 0.0–5.0)
Eosinophils Absolute: 0.1 10*3/uL (ref 0.0–0.7)
HCT: 38 % (ref 36.0–46.0)
HEMOGLOBIN: 12.8 g/dL (ref 12.0–15.0)
Lymphocytes Relative: 30.1 % (ref 12.0–46.0)
Lymphs Abs: 1.2 10*3/uL (ref 0.7–4.0)
MCHC: 33.6 g/dL (ref 30.0–36.0)
MCV: 94.2 fl (ref 78.0–100.0)
MONOS PCT: 9.7 % (ref 3.0–12.0)
Monocytes Absolute: 0.4 10*3/uL (ref 0.1–1.0)
Neutro Abs: 2.3 10*3/uL (ref 1.4–7.7)
Neutrophils Relative %: 56.9 % (ref 43.0–77.0)
Platelets: 328 10*3/uL (ref 150.0–400.0)
RBC: 4.04 Mil/uL (ref 3.87–5.11)
RDW: 13.8 % (ref 11.5–15.5)
WBC: 4.1 10*3/uL (ref 4.0–10.5)

## 2017-03-14 LAB — HEPATIC FUNCTION PANEL
ALT: 23 U/L (ref 0–35)
AST: 19 U/L (ref 0–37)
Albumin: 4.8 g/dL (ref 3.5–5.2)
Alkaline Phosphatase: 78 U/L (ref 39–117)
BILIRUBIN TOTAL: 0.5 mg/dL (ref 0.2–1.2)
Bilirubin, Direct: 0.1 mg/dL (ref 0.0–0.3)
Total Protein: 6.9 g/dL (ref 6.0–8.3)

## 2017-03-14 LAB — BASIC METABOLIC PANEL
BUN: 13 mg/dL (ref 6–23)
CALCIUM: 10 mg/dL (ref 8.4–10.5)
CO2: 27 meq/L (ref 19–32)
CREATININE: 0.64 mg/dL (ref 0.40–1.20)
Chloride: 104 mEq/L (ref 96–112)
GFR: 96.58 mL/min (ref >60.00)
GLUCOSE: 94 mg/dL (ref 70–99)
Potassium: 4.1 mEq/L (ref 3.5–5.1)
Sodium: 140 mEq/L (ref 135–145)

## 2017-03-14 LAB — TSH: TSH: 2.04 u[IU]/mL (ref 0.35–4.50)

## 2017-03-14 LAB — HEMOGLOBIN A1C: HEMOGLOBIN A1C: 5.3 % (ref 4.6–6.5)

## 2017-03-14 MED ORDER — ZOLPIDEM TARTRATE 5 MG PO TABS: 5.0000 mg | ORAL_TABLET | Freq: Every evening | ORAL | 0 refills | Status: DC | PRN

## 2017-03-14 NOTE — Patient Instructions (Addendum)
Continue working on  lifestyle intervention healthy eating and exercise .  Limiting alcohol  Can use  ambien or lunesta  For  Travel sleep but without   Alcohol do not use more than 3 days in a row . To avoid  Dependency   Will notify you  of labs when available.  Depending on results   Plan ROV 6 months med check   Routine mammogram due .    IMPRESSION: No significant change in a small, benign, complicated left breast cyst. No evidence of malignancy.  RECOMMENDATION: Bilateral screening mammogram in 6 months. That will be 1 year since mammographic evaluation of the right breast.  I have discussed the findings and recommendations with the patient. Results were also provided in writing at the conclusion of the visit. If applicable, a reminder letter will be sent to the patient regarding the next appointment.  BI-RADS CATEGORY  2: Benign.   Electronically Signed   By: Claudie Revering M.D.   On: 07/05/2016 16:36   Preventive Care 73 Years and Older, Female Preventive care refers to lifestyle choices and visits with your health care provider that can promote health and wellness. What does preventive care include?  A yearly physical exam. This is also called an annual well check.  Dental exams once or twice a year.  Routine eye exams. Ask your health care provider how often you should have your eyes checked.  Personal lifestyle choices, including: ? Daily care of your teeth and gums. ? Regular physical activity. ? Eating a healthy diet. ? Avoiding tobacco and drug use. ? Limiting alcohol use. ? Practicing safe sex. ? Taking low-dose aspirin every day. ? Taking vitamin and mineral supplements as recommended by your health care provider. What happens during an annual well check? The services and screenings done by your health care provider during your annual well check will depend on your age, overall health, lifestyle risk factors, and family history of  disease. Counseling Your health care provider may ask you questions about your:  Alcohol use.  Tobacco use.  Drug use.  Emotional well-being.  Home and relationship well-being.  Sexual activity.  Eating habits.  History of falls.  Memory and ability to understand (cognition).  Work and work Statistician.  Reproductive health.  Screening You may have the following tests or measurements:  Height, weight, and BMI.  Blood pressure.  Lipid and cholesterol levels. These may be checked every 5 years, or more frequently if you are over 73 years old.  Skin check.  Lung cancer screening. You may have this screening every year starting at age 44 if you have a 30-pack-year history of smoking and currently smoke or have quit within the past 15 years.  Fecal occult blood test (FOBT) of the stool. You may have this test every year starting at age 35.  Flexible sigmoidoscopy or colonoscopy. You may have a sigmoidoscopy every 5 years or a colonoscopy every 10 years starting at age 33.  Hepatitis C blood test.  Hepatitis B blood test.  Sexually transmitted disease (STD) testing.  Diabetes screening. This is done by checking your blood sugar (glucose) after you have not eaten for a while (fasting). You may have this done every 1-3 years.  Bone density scan. This is done to screen for osteoporosis. You may have this done starting at age 64.  Mammogram. This may be done every 1-2 years. Talk to your health care provider about how often you should have regular mammograms.  Talk  with your health care provider about your test results, treatment options, and if necessary, the need for more tests. Vaccines Your health care provider may recommend certain vaccines, such as:  Influenza vaccine. This is recommended every year.  Tetanus, diphtheria, and acellular pertussis (Tdap, Td) vaccine. You may need a Td booster every 10 years.  Varicella vaccine. You may need this if you have  not been vaccinated.  Zoster vaccine. You may need this after age 73.  Measles, mumps, and rubella (MMR) vaccine. You may need at least one dose of MMR if you were born in 1957 or later. You may also need a second dose.  Pneumococcal 13-valent conjugate (PCV13) vaccine. One dose is recommended after age 73.  Pneumococcal polysaccharide (PPSV23) vaccine. One dose is recommended after age 73.  Meningococcal vaccine. You may need this if you have certain conditions.  Hepatitis A vaccine. You may need this if you have certain conditions or if you travel or work in places where you may be exposed to hepatitis A.  Hepatitis B vaccine. You may need this if you have certain conditions or if you travel or work in places where you may be exposed to hepatitis B.  Haemophilus influenzae type b (Hib) vaccine. You may need this if you have certain conditions.  Talk to your health care provider about which screenings and vaccines you need and how often you need them. This information is not intended to replace advice given to you by your health care provider. Make sure you discuss any questions you have with your health care provider. Document Released: 10/15/2015 Document Revised: 06/07/2016 Document Reviewed: 07/20/2015 Elsevier Interactive Patient Education  2017 [employer].

## 2017-04-08 ENCOUNTER — Other Ambulatory Visit: Payer: Self-pay | Admitting: Internal Medicine

## 2017-06-13 ENCOUNTER — Other Ambulatory Visit: Payer: Self-pay | Admitting: Internal Medicine

## 2017-07-11 DIAGNOSIS — M1711 Unilateral primary osteoarthritis, right knee: Secondary | ICD-10-CM | POA: Diagnosis not present

## 2017-07-24 ENCOUNTER — Ambulatory Visit (INDEPENDENT_AMBULATORY_CARE_PROVIDER_SITE_OTHER): Payer: Medicare Other

## 2017-07-24 DIAGNOSIS — Z23 Encounter for immunization: Secondary | ICD-10-CM

## 2017-08-29 NOTE — Progress Notes (Signed)
Chief Complaint  Patient presents with  . Follow-up    Pt reports good tolerance with medications. Pt having new back and knee pain x 6 mo    HPI: Elaine Howell 71 y.o. come in for Chronic disease management    Bp  High in 147 range  Taking meds   on top     No se of meds  etoh at least once a night   Not exercising as much   Thyroid Howell  Mood meds   Seems to be doing ok   hld  On meds   r knee pain  Arthritis  Per gso  Get gluteal pain  Worse with sitting lon periods    Sis has had valve surgery and  Hard to control AF  Noted pulse I in 80 - 90 s but feels ok asks is she should see cardiologist  ROS: See pertinent positives and negatives per HPI.  Past Medical History:  Diagnosis Date  . Chronic insomnia   . Depression   . Heavy alcohol use 07/27/2010   Qualifier: Diagnosis of  By: Elaine Howell, Elaine Howell use     . Hyperlipidemia   . Hypertension   . Thyroiditis    positive antibodies    Family History  Problem Relation Age of Onset  . Heart disease Mother   . Alcohol abuse Father   . Heart disease Father   . Hypertension Sister     Social History   Socioeconomic History  . Marital status: Married    Spouse name: None  . Number of children: None  . Years of education: None  . Highest education level: None  Social Needs  . Financial resource strain: None  . Food insecurity - worry: None  . Food insecurity - inability: None  . Transportation needs - medical: None  . Transportation needs - non-medical: None  Occupational History  . None  Tobacco Use  . Smoking status: Former Smoker    Start date: 07/24/1964    Last attempt to quit: 07/25/1999    Years since quitting: 18.1  . Smokeless tobacco: Never Used  Substance and Sexual Activity  . Alcohol use: Yes    Alcohol/week: 8.4 oz    Types: 14 Glasses of wine per week    Comment: heavy alcohol use  . Drug use: No  . Sexual activity: None  Other Topics Concern  . None  Social History  Narrative      hh of 1   Part time [employer] english as a second language    Now retired    [facility] no [facility]   G2P2   Stop drinking wine one cocktail at night.   Reg exercise 6-7 x per week.     Outpatient Medications Prior to Visit  Medication Sig Dispense Refill  . amLODipine (NORVASC) 10 MG tablet TAKE 1 TABLET BY MOUTH ONCE Howell 90 tablet 2  . buPROPion (WELLBUTRIN XL) 300 MG 24 hr tablet TAKE 1 TABLET BY MOUTH EVERY MORNING 90 tablet 1  . DULoxetine (CYMBALTA) 60 MG capsule TAKE 1 CAPSULE BY MOUTH ONCE Howell 90 capsule 1  . levothyroxine (SYNTHROID, LEVOTHROID) 88 MCG tablet take 1 tablet by mouth Howell 90 tablet 1  . rosuvastatin (CRESTOR) 10 MG tablet take 1 tablet by mouth once Howell 90 tablet 1  . zolpidem (AMBIEN) 5 MG tablet Take 1 tablet (5 mg total) by mouth at bedtime as needed for sleep. For travel .  No more than 3 nights in a row 20 tablet 0  . losartan (COZAAR) 50 MG tablet TAKE 1 TABLET BY MOUTH Howell 90 tablet 1   No facility-administered medications prior to visit.      EXAM:  BP (!) 142/78 (BP Location: Right Arm, Patient Position: Sitting, Cuff Size: Normal)   Pulse 80   Temp 97.8 F (36.6 C) (Oral)   Wt 161 lb 6.4 oz (73.2 kg)   BMI 28.59 kg/m   Body mass index is 28.59 kg/m.  GENERAL: vitals reviewed and listed above, alert, oriented, appears well hydrated and in no acute distress HEENT: atraumatic, conjunctiva  clear, no obvious abnormalities on inspection of external nose and ears   NECK: no obvious masses on inspection palpation  LUNGS: clear to auscultation bilaterally, no wheezes, rales or rhonchi, good air movement CV: ZOXW,96HRRR,84  no clubbing cyanosis or  peripheral edema nl cap refill  Abdomen:  Sof,t normal bowel sounds without hepatosplenomegaly, no guarding rebound or masses no CVA tenderness MS: moves all extremities without noticeable focal  Abnormality nl gait  No point tenderness  PSYCH: pleasant and cooperative, no obvious depression or  anxiety Lab Results  Component Value Date   WBC 4.1 03/14/2017   HGB 12.8 03/14/2017   HCT 38.0 03/14/2017   PLT 328.0 03/14/2017   GLUCOSE 94 03/14/2017   CHOL 218 (H) 03/14/2017   TRIG 67.0 03/14/2017   HDL 101.50 03/14/2017   LDLDIRECT 146.3 09/17/2013   LDLCALC 103 (H) 03/14/2017   ALT 23 03/14/2017   AST 19 03/14/2017   NA 140 03/14/2017   K 4.1 03/14/2017   CL 104 03/14/2017   CREATININE 0.64 03/14/2017   BUN 13 03/14/2017   CO2 27 03/14/2017   TSH 2.04 03/14/2017   HGBA1C 5.3 03/14/2017   BP Readings from Last 3 Encounters:  08/30/17 (!) 142/78  03/14/17 118/80  06/23/16 120/76   Wt Readings from Last 3 Encounters:  08/30/17 161 lb 6.4 oz (73.2 kg)  03/14/17 160 lb 3.2 oz (72.7 kg)  06/23/16 158 lb 4.8 oz (71.8 kg)    ASSESSMENT AND PLAN:  Discussed the following assessment and plan:  Essential hypertension - intensify  med losartan to 100  - Plan: Ambulatory referral to Cardiology  Medication management  Hyperlipidemia, unspecified hyperlipidemia type - Plan: Ambulatory referral to Cardiology  Knee pain, unspecified chronicity, unspecified laterality - may benefot from exercise program   to discuss with  ortho  Hypothyroidism, unspecified type  Cardiovascular risk factor - Plan: Ambulatory referral to Cardiology  Elevated pulse rate - poss from meds  doubt arryhtmia - Plan: Ambulatory referral to Cardiology It is reasonable to have cardiovascular risk assessment because of her family history risk factors and her concern about her heart rate in the 80s and 90s although she has no palpitations or A. fib symptoms. We will send a referral see plan below. Lifestyle changes to continue Yearly checkup and labs and medicines in about 6 months otherwise -Patient advised to return or notify health care team  if  new concerns arise.  Patient Instructions  Check in to pt to strengthen the  Muscles around the knee to  Help with pain and mobility  Will no change  the arthritis otherwise.  Increase losartan to 100 mg  Per day .  Will refer to cardiology for risk assessment  Etc   Because of family hx and issues  Discussed .   Plan yearly cpx check up in 6 months  Elaine Brooking. Lalitha Ilyas M.D.

## 2017-08-30 ENCOUNTER — Encounter: Payer: Self-pay | Admitting: Internal Medicine

## 2017-08-30 ENCOUNTER — Ambulatory Visit: Payer: Medicare Other | Admitting: Internal Medicine

## 2017-08-30 VITALS — BP 142/78 | HR 80 | Temp 97.8°F | Wt 161.4 lb

## 2017-08-30 DIAGNOSIS — E039 Hypothyroidism, unspecified: Secondary | ICD-10-CM | POA: Diagnosis not present

## 2017-08-30 DIAGNOSIS — Z79899 Other long term (current) drug therapy: Secondary | ICD-10-CM | POA: Diagnosis not present

## 2017-08-30 DIAGNOSIS — I10 Essential (primary) hypertension: Secondary | ICD-10-CM

## 2017-08-30 DIAGNOSIS — R000 Tachycardia, unspecified: Secondary | ICD-10-CM | POA: Diagnosis not present

## 2017-08-30 DIAGNOSIS — E785 Hyperlipidemia, unspecified: Secondary | ICD-10-CM | POA: Diagnosis not present

## 2017-08-30 DIAGNOSIS — Z9189 Other specified personal risk factors, not elsewhere classified: Secondary | ICD-10-CM

## 2017-08-30 DIAGNOSIS — M25569 Pain in unspecified knee: Secondary | ICD-10-CM

## 2017-08-30 MED ORDER — LOSARTAN POTASSIUM 100 MG PO TABS: 100.0000 mg | ORAL_TABLET | Freq: Every day | ORAL | 0 refills | Status: DC

## 2017-08-30 NOTE — Patient Instructions (Signed)
Check in to pt to strengthen the  Muscles around the knee to  Help with pain and mobility  Will no change the arthritis otherwise.  Increase losartan to 100 mg  Per day .  Will refer to cardiology for risk assessment  Etc   Because of family hx and issues  Discussed .   Plan yearly cpx check up in 6 months

## 2017-09-06 ENCOUNTER — Ambulatory Visit: Payer: Medicare Other | Admitting: Internal Medicine

## 2017-09-06 DIAGNOSIS — M545 Low back pain: Secondary | ICD-10-CM | POA: Diagnosis not present

## 2017-09-06 DIAGNOSIS — M1711 Unilateral primary osteoarthritis, right knee: Secondary | ICD-10-CM | POA: Diagnosis not present

## 2017-09-06 DIAGNOSIS — G8929 Other chronic pain: Secondary | ICD-10-CM | POA: Diagnosis not present

## 2017-09-17 ENCOUNTER — Other Ambulatory Visit: Payer: Self-pay | Admitting: Internal Medicine

## 2017-09-20 ENCOUNTER — Other Ambulatory Visit: Payer: Self-pay | Admitting: Internal Medicine

## 2017-09-21 ENCOUNTER — Other Ambulatory Visit: Payer: Self-pay | Admitting: *Deleted

## 2017-09-21 MED ORDER — AMLODIPINE BESYLATE 10 MG PO TABS: 10.0000 mg | ORAL_TABLET | Freq: Every day | ORAL | 2 refills | Status: DC

## 2017-09-21 NOTE — Telephone Encounter (Signed)
Contacted pt regarding refill of Norvasc; [facility] [place]; last MD visit 08/30/17

## 2017-09-21 NOTE — Telephone Encounter (Signed)
Pt called in to follow up on request. Pt says that she is going out of town and is completely out.   Please assist further.

## 2017-12-25 ENCOUNTER — Other Ambulatory Visit: Payer: Self-pay | Admitting: Internal Medicine

## 2017-12-28 ENCOUNTER — Other Ambulatory Visit: Payer: Self-pay | Admitting: Internal Medicine

## 2018-01-11 ENCOUNTER — Other Ambulatory Visit: Payer: Self-pay | Admitting: Internal Medicine

## 2018-01-11 DIAGNOSIS — Z139 Encounter for screening, unspecified: Secondary | ICD-10-CM

## 2018-01-13 ENCOUNTER — Other Ambulatory Visit: Payer: Self-pay | Admitting: Internal Medicine

## 2018-02-01 ENCOUNTER — Ambulatory Visit
Admission: RE | Admit: 2018-02-01 | Discharge: 2018-02-01 | Disposition: A | Discharge status: Home / Self Care | Payer: Medicare Other | Source: Ambulatory Visit | Attending: Internal Medicine | Admitting: Internal Medicine

## 2018-02-01 DIAGNOSIS — Z139 Encounter for screening, unspecified: Secondary | ICD-10-CM

## 2018-02-01 DIAGNOSIS — Z1231 Encounter for screening mammogram for malignant neoplasm of breast: Secondary | ICD-10-CM | POA: Diagnosis not present

## 2018-02-06 ENCOUNTER — Other Ambulatory Visit: Payer: Self-pay | Admitting: Internal Medicine

## 2018-02-27 ENCOUNTER — Encounter: Payer: Self-pay | Admitting: Internal Medicine

## 2018-02-27 ENCOUNTER — Ambulatory Visit (INDEPENDENT_AMBULATORY_CARE_PROVIDER_SITE_OTHER): Payer: Medicare Other | Admitting: Internal Medicine

## 2018-02-27 VITALS — BP 126/70 | HR 76 | Temp 97.9°F | Ht 62.75 in | Wt 163.4 lb

## 2018-02-27 DIAGNOSIS — E785 Hyperlipidemia, unspecified: Secondary | ICD-10-CM

## 2018-02-27 DIAGNOSIS — E039 Hypothyroidism, unspecified: Secondary | ICD-10-CM

## 2018-02-27 DIAGNOSIS — Z0000 Encounter for general adult medical examination without abnormal findings: Secondary | ICD-10-CM

## 2018-02-27 DIAGNOSIS — I10 Essential (primary) hypertension: Secondary | ICD-10-CM

## 2018-02-27 DIAGNOSIS — M25569 Pain in unspecified knee: Secondary | ICD-10-CM

## 2018-02-27 DIAGNOSIS — Z974 Presence of external hearing-aid: Secondary | ICD-10-CM

## 2018-02-27 DIAGNOSIS — H9193 Unspecified hearing loss, bilateral: Secondary | ICD-10-CM

## 2018-02-27 DIAGNOSIS — Z79899 Other long term (current) drug therapy: Secondary | ICD-10-CM | POA: Diagnosis not present

## 2018-02-27 LAB — HEPATIC FUNCTION PANEL
ALBUMIN: 4.7 g/dL (ref 3.5–5.2)
ALT: 24 U/L (ref 0–35)
AST: 22 U/L (ref 0–37)
Alkaline Phosphatase: 73 U/L (ref 39–117)
Bilirubin, Direct: 0.1 mg/dL (ref 0.0–0.3)
TOTAL PROTEIN: 7.2 g/dL (ref 6.0–8.3)
Total Bilirubin: 0.5 mg/dL (ref 0.2–1.2)

## 2018-02-27 LAB — BASIC METABOLIC PANEL
BUN: 14 mg/dL (ref 6–23)
CHLORIDE: 104 meq/L (ref 96–112)
CO2: 29 mEq/L (ref 19–32)
Calcium: 9.9 mg/dL (ref 8.4–10.5)
Creatinine, Ser: 0.66 mg/dL (ref 0.40–1.20)
GFR: 92.97 mL/min (ref >60.00)
GLUCOSE: 89 mg/dL (ref 70–99)
Potassium: 4.4 mEq/L (ref 3.5–5.1)
SODIUM: 141 meq/L (ref 135–145)

## 2018-02-27 LAB — CBC WITH DIFFERENTIAL/PLATELET
BASOS PCT: 0.6 % (ref 0.0–3.0)
Basophils Absolute: 0 10*3/uL (ref 0.0–0.1)
Eosinophils Absolute: 0.1 10*3/uL (ref 0.0–0.7)
Eosinophils Relative: 4 % (ref 0.0–5.0)
HEMATOCRIT: 37.9 % (ref 36.0–46.0)
Hemoglobin: 12.7 g/dL (ref 12.0–15.0)
LYMPHS ABS: 1.1 10*3/uL (ref 0.7–4.0)
LYMPHS PCT: 30.7 % (ref 12.0–46.0)
MCHC: 33.6 g/dL (ref 30.0–36.0)
MCV: 95 fl (ref 78.0–100.0)
MONOS PCT: 10.5 % (ref 3.0–12.0)
Monocytes Absolute: 0.4 10*3/uL (ref 0.1–1.0)
NEUTROS ABS: 2 10*3/uL (ref 1.4–7.7)
Neutrophils Relative %: 54.2 % (ref 43.0–77.0)
PLATELETS: 338 10*3/uL (ref 150.0–400.0)
RBC: 3.99 Mil/uL (ref 3.87–5.11)
RDW: 13.8 % (ref 11.5–15.5)
WBC: 3.6 10*3/uL — ABNORMAL LOW (ref 4.0–10.5)

## 2018-02-27 LAB — LIPID PANEL
CHOLESTEROL: 226 mg/dL — AB (ref 0–200)
HDL: 117 mg/dL (ref >39.00)
LDL Cholesterol: 98 mg/dL (ref 0–99)
NONHDL: 109.09
TRIGLYCERIDES: 55 mg/dL (ref 0.0–149.0)
Total CHOL/HDL Ratio: 2
VLDL: 11 mg/dL (ref 0.0–40.0)

## 2018-02-27 LAB — TSH: TSH: 1.05 u[IU]/mL (ref 0.35–4.50)

## 2018-02-27 NOTE — Progress Notes (Signed)
Chief Complaint  Patient presents with  . Annual Exam    No new concerns    HPI: Elaine Howell 72 y.o. comes in today for Preventive Medicare exam/ med  visit and Chronic disease management  .Since last visit. Doing ok   Knee bothersome     Saw alusio   Avoiding treadmill.   Mood stable doing on on same meds . Not taking  ambien   bp taking med not checking readings  Thyroid: no   change med  Seems ok   HLD taking crestor daily.    Hearing aids  Doesn't  like the way they fit....  Eye check soon   Health Maintenance  Topic Date Due  . FOOT EXAM  11/03/1955  . COLONOSCOPY  05/16/2015  . HEMOGLOBIN A1C  09/13/2017  . OPHTHALMOLOGY EXAM  08/30/2018 (Originally 11/03/1955)  . INFLUENZA VACCINE  05/02/2018  . MAMMOGRAM  02/02/2020  . TETANUS/TDAP  10/20/2024  . DEXA SCAN  Completed  . Hepatitis C Screening  Completed  . PNA vac Low Risk Adult  Completed   Health Maintenance Review LIFESTYLE:  Exercise:   Just went back .   gym    Limiting arthritis  Knee  .    Tobacco/ETS:  no Alcohol:  A challenge    Ave  2 per day .    hasnt checked  BP  Sugar beverages: no rare  Sleep:   7-8   Drug use: no HH:  1 no pets  Totally retired  Education officer, [employer] to [employer]   Otherwise  No scheduled  Time    Hearing:  Sister nagging around hearing aids.   Uncomfortable.   Vision:  No limitations at present . Last eye check UTD  Safety:  Has smoke detector and wears seat belts.  No excess sun exposure. Sees dentist regularly  Memory: Felt to be ok    , no concern from her or her family.  Sometimes names    Depression: No anhedonia unusual crying or depressive symptoms  Nutrition: Eats reasonably well balanced diet; adequate calcium and vitamin D. No swallowing chewing problems.  Other healthcare providers:  Reviewed today .  Preventive parameters: up-to-date  Reviewed   ADLS:   There are no problems or need for assistance  driving, feeding, obtaining food, dressing, toileting and  bathing, managing money using phone. She is independent. Retired    ROS:  Side of left foot  Hurts at times no injury or welling  GEN/ HEENT: No fever, significant weight changes sweats headaches vision problems hearing changes, CV/ PULM; No chest pain shortness of breath cough, syncope,edema  change in exercise tolerance. GI /GU: No adominal pain, vomiting, change in bowel habits. No blood in the stool. No significant GU symptoms. SKIN/HEME: ,no acute skin rashes suspicious lesions or bleeding. No lymphadenopathy, nodules, masses.  NEURO/ PSYCH:  No neurologic signs such as weakness numbness. No depression anxiety. IMM/ Allergy: No unusual infections.  Allergy .   REST of 12 system review negative except as per HPI   Past Medical History:  Diagnosis Date  . Chronic insomnia   . Depression   . Heavy alcohol use 07/27/2010   Qualifier: Diagnosis of  By: Regis Bill MD, Standley Brooking Daily use     . Hyperlipidemia   . Hypertension   . Thyroiditis    positive antibodies    Family History  Problem Relation Age of Onset  . Heart disease Mother   . Alcohol abuse Father   .  Heart disease Father   . Hypertension Sister     Social History   Socioeconomic History  . Marital status: Married    Spouse name: Not on file  . Number of children: Not on file  . Years of education: Not on file  . Highest education level: Not on file  Occupational History  . Not on file  Social Needs  . Financial resource strain: Not on file  . Food insecurity:    Worry: Not on file    Inability: Not on file  . Transportation needs:    Medical: Not on file    Non-medical: Not on file  Tobacco Use  . Smoking status: Former Smoker    Start date: 07/24/1964    Last attempt to quit: 07/25/1999    Years since quitting: 18.6  . Smokeless tobacco: Never Used  Substance and Sexual Activity  . Alcohol use: Yes    Alcohol/week: 8.4 oz    Types: 14 Glasses of wine per week    Comment: heavy alcohol use  . Drug  use: No  . Sexual activity: Not on file  Lifestyle  . Physical activity:    Days per week: Not on file    Minutes per session: Not on file  . Stress: Not on file  Relationships  . Social connections:    Talks on phone: Not on file    Gets together: Not on file    Attends religious service: Not on file    Active member of club or organization: Not on file    Attends meetings of clubs or organizations: Not on file    Relationship status: Not on file  Other Topics Concern  . Not on file  Social History Narrative      hh of 1   Part time [place] as a second language    Now retired    [facility] of 1 no pets   G2P2    Outpatient Encounter Medications as of 02/27/2018  Medication Sig  . amLODipine (NORVASC) 10 MG tablet Take 1 tablet (10 mg total) by mouth daily.  Marland Kitchen buPROPion (WELLBUTRIN XL) 300 MG 24 hr tablet TAKE 1 TABLET BY MOUTH EVERY MORNING.  . DULoxetine (CYMBALTA) 60 MG capsule TAKE ONE CAPSULE BY MOUTH DAILY  . levothyroxine (SYNTHROID, LEVOTHROID) 88 MCG tablet TAKE 1 TABLET BY MOUTH DAILY  . losartan (COZAAR) 100 MG tablet TAKE 1 TABLET BY MOUTH DAILY  . rosuvastatin (CRESTOR) 10 MG tablet TAKE 1 TABLET BY MOUTH DAILY  . zolpidem (AMBIEN) 5 MG tablet Take 1 tablet (5 mg total) by mouth at bedtime as needed for sleep. For travel . No more than 3 nights in a row (Patient not taking: Reported on 02/27/2018)   No facility-administered encounter medications on file as of 02/27/2018.     EXAM:  BP 126/70 (BP Location: Right Arm, Patient Position: Sitting, Cuff Size: Normal)   Pulse 76   Temp 97.9 F (36.6 C) (Oral)   Ht 5' 2.75" (1.594 m)   Wt 163 lb 6.4 oz (74.1 kg)   BMI 29.18 kg/m   Body mass index is 29.18 kg/m.  Physical Exam: Vital signs reviewed YQM:VHQI is a well-developed well-nourished alert cooperative   who appears stated age in no acute distress.  HEENT: normocephalic atraumatic , Eyes: PERRL EOM's full, conjunctiva clear, Nares: paten,t no deformity  discharge or tenderness., Ears: no deformity EAC's clear TMs with normal landmarks. Mouth: clear OP, no lesions, edema.  Moist mucous membranes.  Dentition in adequate repair. NECK: supple without masses, thyromegaly or bruits. CHEST/PULM:  Clear to auscultation and percussion breath sounds equal no wheeze , rales or rhonchi. No chest wall deformities or tenderness.Breast: normal by inspection . No dimpling, discharge, masses, tenderness or discharge .CV: PMI is nondisplaced, S1 S2 no gallops, murmurs, rubs. Peripheral pulses are full without delay.No JVD .  ABDOMEN: Bowel sounds normal nontender  No guard or rebound, no hepato splenomegal no CVA tenderness.   Extremtities:  No clubbing cyanosis or edema, no acute joint swelling or redness no focal atrophy NEURO:  Oriented x3, cranial nerves 3-12 appear to be intact, no obvious focal weakness,gait within normal limits no abnormal reflexes or asymmetrical SKIN: No acute rashes normal turgor, color, no bruising or petechiae. PSYCH: Oriented, good eye contact, no obvious depression anxiety, cognition and judgment appear normal. LN: no cervical axillary inguinal adenopathy No noted deficits in memory, attention, and speech.   Lab Results  Component Value Date   WBC 4.1 03/14/2017   HGB 12.8 03/14/2017   HCT 38.0 03/14/2017   PLT 328.0 03/14/2017   GLUCOSE 94 03/14/2017   CHOL 218 (H) 03/14/2017   TRIG 67.0 03/14/2017   HDL 101.50 03/14/2017   LDLDIRECT 146.3 09/17/2013   LDLCALC 103 (H) 03/14/2017   ALT 23 03/14/2017   AST 19 03/14/2017   NA 140 03/14/2017   K 4.1 03/14/2017   CL 104 03/14/2017   CREATININE 0.64 03/14/2017   BUN 13 03/14/2017   CO2 27 03/14/2017   TSH 2.04 03/14/2017   HGBA1C 5.3 03/14/2017    ASSESSMENT AND PLAN:  Discussed the following assessment and plan:  Visit for preventive health examination  Essential hypertension - Plan: Basic metabolic panel, CBC with Differential/Platelet, Hepatic function panel,  Lipid panel, TSH  Hyperlipidemia, unspecified hyperlipidemia type - Plan: Basic metabolic panel, CBC with Differential/Platelet, Hepatic function panel, Lipid panel, TSH  Hypothyroidism, unspecified type - Plan: Basic metabolic panel, CBC with Differential/Platelet, Hepatic function panel, Lipid panel, TSH  Knee pain, unspecified chronicity, unspecified laterality - Plan: Basic metabolic panel, CBC with Differential/Platelet, Hepatic function panel, Lipid panel, TSH  Medication management - Plan: Basic metabolic panel, CBC with Differential/Platelet, Hepatic function panel, Lipid panel, TSH  Wears hearing aid  Decreased hearing of both ears  Patient Care Team: Hero Kulish, Standley Brooking, MD as PCP - General Rutherford Guys, MD as Attending Physician (Ophthalmology) Azucena Fallen, MD as Attending Physician (Obstetrics and Gynecology)  Patient Instructions  Will notify you  of labs when available. Same medications    bp goal 120/80   Limit  Moderation alcohol  Try elliptical at the gyme and cross training poss trainer   To help .       Health Maintenance, Female Adopting a healthy lifestyle and getting preventive care can go a long way to promote health and wellness. Talk with your health care provider about what schedule of regular examinations is right for you. This is a good chance for you to check in with your provider about disease prevention and staying healthy. In between checkups, there are plenty of things you can do on your own. Experts have done a lot of research about which lifestyle changes and preventive measures are most likely to keep you healthy. Ask your health care provider for more information. Weight and diet Eat a healthy diet  Be sure to include plenty of vegetables, fruits, low-fat dairy products, and lean protein.  Do not eat a lot of foods high in solid fats,  added sugars, or salt.  Get regular exercise. This is one of the most important things you can do for your  health. ? Most adults should exercise for at least 150 minutes each week. The exercise should increase your heart rate and make you sweat (moderate-intensity exercise). ? Most adults should also do strengthening exercises at least twice a week. This is in addition to the moderate-intensity exercise.  Maintain a healthy weight  Body mass index (BMI) is a measurement that can be used to identify possible weight problems. It estimates body fat based on height and weight. Your health care provider can help determine your BMI and help you achieve or maintain a healthy weight.  For females 7 years of age and older: ? A BMI below 18.5 is considered underweight. ? A BMI of 18.5 to 24.9 is normal. ? A BMI of 25 to 29.9 is considered overweight. ? A BMI of 30 and above is considered obese.  Watch levels of cholesterol and blood lipids  You should start having your blood tested for lipids and cholesterol at 74 years of age, then have this test every 5 years.  You may need to have your cholesterol levels checked more often if: ? Your lipid or cholesterol levels are high. ? You are older than 74 years of age. ? You are at high risk for heart disease.  Cancer screening Lung Cancer  Lung cancer screening is recommended for adults 17-74 years old who are at high risk for lung cancer because of a history of smoking.  A yearly low-dose CT scan of the lungs is recommended for people who: ? Currently smoke. ? Have quit within the past 15 years. ? Have at least a 30-pack-year history of smoking. A pack year is smoking an average of one pack of cigarettes a day for 1 year.  Yearly screening should continue until it has been 15 years since you quit.  Yearly screening should stop if you develop a health problem that would prevent you from having lung cancer treatment.  Breast Cancer  Practice breast self-awareness. This means understanding how your breasts normally appear and feel.  It also means  doing regular breast self-exams. Let your health care provider know about any changes, no matter how small.  If you are in your 20s or 30s, you should have a clinical breast exam (CBE) by a health care provider every 1-3 years as part of a regular health exam.  If you are 38 or older, have a CBE every year. Also consider having a breast X-ray (mammogram) every year.  If you have a family history of breast cancer, talk to your health care provider about genetic screening.  If you are at high risk for breast cancer, talk to your health care provider about having an MRI and a mammogram every year.  Breast cancer gene (BRCA) assessment is recommended for women who have family members with BRCA-related cancers. BRCA-related cancers include: ? Breast. ? Ovarian. ? Tubal. ? Peritoneal cancers.  Results of the assessment will determine the need for genetic counseling and BRCA1 and BRCA2 testing.  Cervical Cancer Your health care provider may recommend that you be screened regularly for cancer of the pelvic organs (ovaries, uterus, and vagina). This screening involves a pelvic examination, including checking for microscopic changes to the surface of your cervix (Pap test). You may be encouraged to have this screening done every 3 years, beginning at age 40.  For women ages 28-65, health care providers may  recommend pelvic exams and Pap testing every 3 years, or they may recommend the Pap and pelvic exam, combined with testing for human papilloma virus (HPV), every 5 years. Some types of HPV increase your risk of cervical cancer. Testing for HPV may also be done on women of any age with unclear Pap test results.  Other health care providers may not recommend any screening for nonpregnant women who are considered low risk for pelvic cancer and who do not have symptoms. Ask your health care provider if a screening pelvic exam is right for you.  If you have had past treatment for cervical cancer or a  condition that could lead to cancer, you need Pap tests and screening for cancer for at least 20 years after your treatment. If Pap tests have been discontinued, your risk factors (such as having a new sexual partner) need to be reassessed to determine if screening should resume. Some women have medical problems that increase the chance of getting cervical cancer. In these cases, your health care provider may recommend more frequent screening and Pap tests.  Colorectal Cancer  This type of cancer can be detected and often prevented.  Routine colorectal cancer screening usually begins at 74 years of age and continues through 74 years of age.  Your health care provider may recommend screening at an earlier age if you have risk factors for colon cancer.  Your health care provider may also recommend using home test kits to check for hidden blood in the stool.  A small camera at the end of a tube can be used to examine your colon directly (sigmoidoscopy or colonoscopy). This is done to check for the earliest forms of colorectal cancer.  Routine screening usually begins at age 73.  Direct examination of the colon should be repeated every 5-10 years through 74 years of age. However, you may need to be screened more often if early forms of precancerous polyps or small growths are found.  Skin Cancer  Check your skin from head to toe regularly.  Tell your health care provider about any new moles or changes in moles, especially if there is a change in a mole's shape or color.  Also tell your health care provider if you have a mole that is larger than the size of a pencil eraser.  Always use sunscreen. Apply sunscreen liberally and repeatedly throughout the day.  Protect yourself by wearing long sleeves, pants, a wide-brimmed hat, and sunglasses whenever you are outside.  Heart disease, diabetes, and high blood pressure  High blood pressure causes heart disease and increases the risk of stroke.  High blood pressure is more likely to develop in: ? People who have blood pressure in the high end of the normal range (130-139/85-89 mm Hg). ? People who are overweight or obese. ? People who are African American.  If you are 79-62 years of age, have your blood pressure checked every 3-5 years. If you are 23 years of age or older, have your blood pressure checked every year. You should have your blood pressure measured twice-once when you are at a hospital or clinic, and once when you are not at a hospital or clinic. Record the average of the two measurements. To check your blood pressure when you are not at a hospital or clinic, you can use: ? An automated blood pressure machine at a pharmacy. ? A home blood pressure monitor.  If you are between 32 years and 19 years old, ask your health care  provider if you should take aspirin to prevent strokes.  Have regular diabetes screenings. This involves taking a blood sample to check your fasting blood sugar level. ? If you are at a normal weight and have a low risk for diabetes, have this test once every three years after 74 years of age. ? If you are overweight and have a high risk for diabetes, consider being tested at a younger age or more often. Preventing infection Hepatitis B  If you have a higher risk for hepatitis B, you should be screened for this virus. You are considered at high risk for hepatitis B if: ? You were born in a country where hepatitis B is common. Ask your health care provider which countries are considered high risk. ? Your parents were born in a high-risk country, and you have not been immunized against hepatitis B (hepatitis B vaccine). ? You have HIV or AIDS. ? You use needles to inject street drugs. ? You live with someone who has hepatitis B. ? You have had sex with someone who has hepatitis B. ? You get hemodialysis treatment. ? You take certain medicines for conditions, including cancer, organ transplantation, and  autoimmune conditions.  Hepatitis C  Blood testing is recommended for: ? Everyone born from [birth year] through 1967. ? Anyone with known risk factors for hepatitis C.  Sexually transmitted infections (STIs)  You should be screened for sexually transmitted infections (STIs) including gonorrhea and chlamydia if: ? You are sexually active and are younger than 74 years of age. ? You are older than 74 years of age and your health care provider tells you that you are at risk for this type of infection. ? Your sexual activity has changed since you were last screened and you are at an increased risk for chlamydia or gonorrhea. Ask your health care provider if you are at risk.  If you do not have HIV, but are at risk, it may be recommended that you take a prescription medicine daily to prevent HIV infection. This is called pre-exposure prophylaxis (PrEP). You are considered at risk if: ? You are sexually active and do not regularly use condoms or know the HIV status of your partner(s). ? You take drugs by injection. ? You are sexually active with a partner who has HIV.  Talk with your health care provider about whether you are at high risk of being infected with HIV. If you choose to begin PrEP, you should first be tested for HIV. You should then be tested every 3 months for as long as you are taking PrEP. Pregnancy  If you are premenopausal and you may become pregnant, ask your health care provider about preconception counseling.  If you may become pregnant, take 400 to 800 micrograms (mcg) of folic acid every day.  If you want to prevent pregnancy, talk to your health care provider about birth control (contraception). Osteoporosis and menopause  Osteoporosis is a disease in which the bones lose minerals and strength with aging. This can result in serious bone fractures. Your risk for osteoporosis can be identified using a bone density scan.  If you are 21 years of age or older, or if you are at  risk for osteoporosis and fractures, ask your health care provider if you should be screened.  Ask your health care provider whether you should take a calcium or vitamin D supplement to lower your risk for osteoporosis.  Menopause may have certain physical symptoms and risks.  Hormone replacement therapy may  reduce some of these symptoms and risks. Talk to your health care provider about whether hormone replacement therapy is right for you. Follow these instructions at home:  Schedule regular health, dental, and eye exams.  Stay current with your immunizations.  Do not use any tobacco products including cigarettes, chewing tobacco, or electronic cigarettes.  If you are pregnant, do not drink alcohol.  If you are breastfeeding, limit how much and how often you drink alcohol.  Limit alcohol intake to no more than 1 drink per day for nonpregnant women. One drink equals 12 ounces of beer, 5 ounces of wine, or 1 ounces of hard liquor.  Do not use street drugs.  Do not share needles.  Ask your health care provider for help if you need support or information about quitting drugs.  Tell your health care provider if you often feel depressed.  Tell your health care provider if you have ever been abused or do not feel safe at home. This information is not intended to replace advice given to you by your health care provider. Make sure you discuss any questions you have with your health care provider. Document Released: 04/03/2011 Document Revised: 02/24/2016 Document Reviewed: 06/22/2015 Elsevier Interactive Patient Education  2018 [employer]. Iseah Plouff M.D.

## 2018-02-27 NOTE — Patient Instructions (Signed)
Will notify you  of labs when available. Same medications    bp goal 120/80   Limit  Moderation alcohol  Try elliptical at the gyme and cross training poss trainer   To help .       Health Maintenance, Female Adopting a healthy lifestyle and getting preventive care can go a long way to promote health and wellness. Talk with your health care provider about what schedule of regular examinations is right for you. This is a good chance for you to check in with your provider about disease prevention and staying healthy. In between checkups, there are plenty of things you can do on your own. Experts have done a lot of research about which lifestyle changes and preventive measures are most likely to keep you healthy. Ask your health care provider for more information. Weight and diet Eat a healthy diet  Be sure to include plenty of vegetables, fruits, low-fat dairy products, and lean protein.  Do not eat a lot of foods high in solid fats, added sugars, or salt.  Get regular exercise. This is one of the most important things you can do for your health. ? Most adults should exercise for at least 150 minutes each week. The exercise should increase your heart rate and make you sweat (moderate-intensity exercise). ? Most adults should also do strengthening exercises at least twice a week. This is in addition to the moderate-intensity exercise.  Maintain a healthy weight  Body mass index (BMI) is a measurement that can be used to identify possible weight problems. It estimates body fat based on height and weight. Your health care provider can help determine your BMI and help you achieve or maintain a healthy weight.  For females 103 years of age and older: ? A BMI below 18.5 is considered underweight. ? A BMI of 18.5 to 24.9 is normal. ? A BMI of 25 to 29.9 is considered overweight. ? A BMI of 30 and above is considered obese.  Watch levels of cholesterol and blood lipids  You should start  having your blood tested for lipids and cholesterol at 75 years of age, then have this test every 5 years.  You may need to have your cholesterol levels checked more often if: ? Your lipid or cholesterol levels are high. ? You are older than 74 years of age. ? You are at high risk for heart disease.  Cancer screening Lung Cancer  Lung cancer screening is recommended for adults 63-59 years old who are at high risk for lung cancer because of a history of smoking.  A yearly low-dose CT scan of the lungs is recommended for people who: ? Currently smoke. ? Have quit within the past 15 years. ? Have at least a 30-pack-year history of smoking. A pack year is smoking an average of one pack of cigarettes a day for 1 year.  Yearly screening should continue until it has been 15 years since you quit.  Yearly screening should stop if you develop a health problem that would prevent you from having lung cancer treatment.  Breast Cancer  Practice breast self-awareness. This means understanding how your breasts normally appear and feel.  It also means doing regular breast self-exams. Let your health care provider know about any changes, no matter how small.  If you are in your 20s or 30s, you should have a clinical breast exam (CBE) by a health care provider every 1-3 years as part of a regular health exam.  If you  are 79 or older, have a CBE every year. Also consider having a breast X-ray (mammogram) every year.  If you have a family history of breast cancer, talk to your health care provider about genetic screening.  If you are at high risk for breast cancer, talk to your health care provider about having an MRI and a mammogram every year.  Breast cancer gene (BRCA) assessment is recommended for women who have family members with BRCA-related cancers. BRCA-related cancers include: ? Breast. ? Ovarian. ? Tubal. ? Peritoneal cancers.  Results of the assessment will determine the need for  genetic counseling and BRCA1 and BRCA2 testing.  Cervical Cancer Your health care provider may recommend that you be screened regularly for cancer of the pelvic organs (ovaries, uterus, and vagina). This screening involves a pelvic examination, including checking for microscopic changes to the surface of your cervix (Pap test). You may be encouraged to have this screening done every 3 years, beginning at age 22.  For women ages 70-65, health care providers may recommend pelvic exams and Pap testing every 3 years, or they may recommend the Pap and pelvic exam, combined with testing for human papilloma virus (HPV), every 5 years. Some types of HPV increase your risk of cervical cancer. Testing for HPV may also be done on women of any age with unclear Pap test results.  Other health care providers may not recommend any screening for nonpregnant women who are considered low risk for pelvic cancer and who do not have symptoms. Ask your health care provider if a screening pelvic exam is right for you.  If you have had past treatment for cervical cancer or a condition that could lead to cancer, you need Pap tests and screening for cancer for at least 20 years after your treatment. If Pap tests have been discontinued, your risk factors (such as having a new sexual partner) need to be reassessed to determine if screening should resume. Some women have medical problems that increase the chance of getting cervical cancer. In these cases, your health care provider may recommend more frequent screening and Pap tests.  Colorectal Cancer  This type of cancer can be detected and often prevented.  Routine colorectal cancer screening usually begins at 74 years of age and continues through 74 years of age.  Your health care provider may recommend screening at an earlier age if you have risk factors for colon cancer.  Your health care provider may also recommend using home test kits to check for hidden blood in the  stool.  A small camera at the end of a tube can be used to examine your colon directly (sigmoidoscopy or colonoscopy). This is done to check for the earliest forms of colorectal cancer.  Routine screening usually begins at age 78.  Direct examination of the colon should be repeated every 5-10 years through 74 years of age. However, you may need to be screened more often if early forms of precancerous polyps or small growths are found.  Skin Cancer  Check your skin from head to toe regularly.  Tell your health care provider about any new moles or changes in moles, especially if there is a change in a mole's shape or color.  Also tell your health care provider if you have a mole that is larger than the size of a pencil eraser.  Always use sunscreen. Apply sunscreen liberally and repeatedly throughout the day.  Protect yourself by wearing long sleeves, pants, a wide-brimmed hat, and sunglasses whenever  you are outside.  Heart disease, diabetes, and high blood pressure  High blood pressure causes heart disease and increases the risk of stroke. High blood pressure is more likely to develop in: ? People who have blood pressure in the high end of the normal range (130-139/85-89 mm Hg). ? People who are overweight or obese. ? People who are African American.  If you are 96-65 years of age, have your blood pressure checked every 3-5 years. If you are 79 years of age or older, have your blood pressure checked every year. You should have your blood pressure measured twice-once when you are at a hospital or clinic, and once when you are not at a hospital or clinic. Record the average of the two measurements. To check your blood pressure when you are not at a hospital or clinic, you can use: ? An automated blood pressure machine at a pharmacy. ? A home blood pressure monitor.  If you are between 53 years and 2 years old, ask your health care provider if you should take aspirin to prevent  strokes.  Have regular diabetes screenings. This involves taking a blood sample to check your fasting blood sugar level. ? If you are at a normal weight and have a low risk for diabetes, have this test once every three years after 74 years of age. ? If you are overweight and have a high risk for diabetes, consider being tested at a younger age or more often. Preventing infection Hepatitis B  If you have a higher risk for hepatitis B, you should be screened for this virus. You are considered at high risk for hepatitis B if: ? You were born in a country where hepatitis B is common. Ask your health care provider which countries are considered high risk. ? Your parents were born in a high-risk country, and you have not been immunized against hepatitis B (hepatitis B vaccine). ? You have HIV or AIDS. ? You use needles to inject street drugs. ? You live with someone who has hepatitis B. ? You have had sex with someone who has hepatitis B. ? You get hemodialysis treatment. ? You take certain medicines for conditions, including cancer, organ transplantation, and autoimmune conditions.  Hepatitis C  Blood testing is recommended for: ? Everyone born from [birth year] through 1967. ? Anyone with known risk factors for hepatitis C.  Sexually transmitted infections (STIs)  You should be screened for sexually transmitted infections (STIs) including gonorrhea and chlamydia if: ? You are sexually active and are younger than 74 years of age. ? You are older than 74 years of age and your health care provider tells you that you are at risk for this type of infection. ? Your sexual activity has changed since you were last screened and you are at an increased risk for chlamydia or gonorrhea. Ask your health care provider if you are at risk.  If you do not have HIV, but are at risk, it may be recommended that you take a prescription medicine daily to prevent HIV infection. This is called pre-exposure prophylaxis  (PrEP). You are considered at risk if: ? You are sexually active and do not regularly use condoms or know the HIV status of your partner(s). ? You take drugs by injection. ? You are sexually active with a partner who has HIV.  Talk with your health care provider about whether you are at high risk of being infected with HIV. If you choose to begin PrEP, you should first  be tested for HIV. You should then be tested every 3 months for as long as you are taking PrEP. Pregnancy  If you are premenopausal and you may become pregnant, ask your health care provider about preconception counseling.  If you may become pregnant, take 400 to 800 micrograms (mcg) of folic acid every day.  If you want to prevent pregnancy, talk to your health care provider about birth control (contraception). Osteoporosis and menopause  Osteoporosis is a disease in which the bones lose minerals and strength with aging. This can result in serious bone fractures. Your risk for osteoporosis can be identified using a bone density scan.  If you are 68 years of age or older, or if you are at risk for osteoporosis and fractures, ask your health care provider if you should be screened.  Ask your health care provider whether you should take a calcium or vitamin D supplement to lower your risk for osteoporosis.  Menopause may have certain physical symptoms and risks.  Hormone replacement therapy may reduce some of these symptoms and risks. Talk to your health care provider about whether hormone replacement therapy is right for you. Follow these instructions at home:  Schedule regular health, dental, and eye exams.  Stay current with your immunizations.  Do not use any tobacco products including cigarettes, chewing tobacco, or electronic cigarettes.  If you are pregnant, do not drink alcohol.  If you are breastfeeding, limit how much and how often you drink alcohol.  Limit alcohol intake to no more than 1 drink per day for  nonpregnant women. One drink equals 12 ounces of beer, 5 ounces of wine, or 1 ounces of hard liquor.  Do not use street drugs.  Do not share needles.  Ask your health care provider for help if you need support or information about quitting drugs.  Tell your health care provider if you often feel depressed.  Tell your health care provider if you have ever been abused or do not feel safe at home. This information is not intended to replace advice given to you by your health care provider. Make sure you discuss any questions you have with your health care provider. Document Released: 04/03/2011 Document Revised: 02/24/2016 Document Reviewed: 06/22/2015 Elsevier Interactive Patient Education  [employer].

## 2018-03-06 ENCOUNTER — Telehealth: Payer: Self-pay | Admitting: Internal Medicine

## 2018-03-06 NOTE — Telephone Encounter (Signed)
Copied from CRM [id number]. Topic: Quick Communication - Lab Results >> Mar 06, 2018  9:11 AM Maisie FusGreen, Ashtyn M, CMA wrote: Called patient to inform them of 02/27/18 lab results. When patient returns call, triage nurse may disclose results.

## 2018-03-19 DIAGNOSIS — M79672 Pain in left foot: Secondary | ICD-10-CM | POA: Diagnosis not present

## 2018-03-19 DIAGNOSIS — M722 Plantar fascial fibromatosis: Secondary | ICD-10-CM | POA: Diagnosis not present

## 2018-03-23 ENCOUNTER — Other Ambulatory Visit: Payer: Self-pay | Admitting: Internal Medicine

## 2018-04-05 ENCOUNTER — Other Ambulatory Visit: Payer: Self-pay | Admitting: Internal Medicine

## 2018-04-09 DIAGNOSIS — M79672 Pain in left foot: Secondary | ICD-10-CM | POA: Diagnosis not present

## 2018-04-09 DIAGNOSIS — M722 Plantar fascial fibromatosis: Secondary | ICD-10-CM | POA: Diagnosis not present

## 2018-04-10 ENCOUNTER — Other Ambulatory Visit: Payer: Self-pay | Admitting: Internal Medicine

## 2018-05-19 NOTE — Progress Notes (Signed)
Chief Complaint  Patient presents with  . Medication Problem    Wanted to to talk about current anti depression medication    HPI: Elaine Howell 72 y.o. come in for concerns about antidepressant medicines she has been on them for a while.  Depression seems to be better.  She is read that antidepressants can be associated or trigger Alzheimer's.  Most recently she has some concern because she has some issues with name finding but it otherwise okay She has been drinking a lot more alcohol recently because of vacation parties.  She has gained some weight. She plans on zeroing out alcohol or limiting.  And adjusting her diet.  .  She is not really taking the Ambien. Had PV in may  ROS: See pertinent positives and negatives per HPI.  Past Medical History:  Diagnosis Date  . Chronic insomnia   . Depression   . Heavy alcohol use 07/27/2010   Qualifier: Diagnosis of  By: Fabian Sharp MD, Neta Mends Daily use     . Hyperlipidemia   . Hypertension   . Thyroiditis    positive antibodies    Family History  Problem Relation Age of Onset  . Heart disease Mother   . Alcohol abuse Father   . Heart disease Father   . Hypertension Sister     Social History   Socioeconomic History  . Marital status: Married    Spouse name: Not on file  . Number of children: Not on file  . Years of education: Not on file  . Highest education level: Not on file  Occupational History  . Not on file  Social Needs  . Financial resource strain: Not on file  . Food insecurity:    Worry: Not on file    Inability: Not on file  . Transportation needs:    Medical: Not on file    Non-medical: Not on file  Tobacco Use  . Smoking status: Former Smoker    Start date: 07/24/1964    Last attempt to quit: 07/25/1999    Years since quitting: 18.8  . Smokeless tobacco: Never Used  Substance and Sexual Activity  . Alcohol use: Yes    Alcohol/week: 14.0 standard drinks    Types: 14 Glasses of wine per week      Comment: heavy alcohol use  . Drug use: No  . Sexual activity: Not on file  Lifestyle  . Physical activity:    Days per week: Not on file    Minutes per session: Not on file  . Stress: Not on file  Relationships  . Social connections:    Talks on phone: Not on file    Gets together: Not on file    Attends religious service: Not on file    Active member of club or organization: Not on file    Attends meetings of clubs or organizations: Not on file    Relationship status: Not on file  Other Topics Concern  . Not on file  Social History Narrative      hh of 1   Part time [employer] english as a second language    Now retired    [facility] of 1 no pets   G2P2    Outpatient Medications Prior to Visit  Medication Sig Dispense Refill  . amLODipine (NORVASC) 10 MG tablet Take 1 tablet (10 mg total) by mouth daily. 90 tablet 2  . buPROPion (WELLBUTRIN XL) 300 MG 24 hr tablet TAKE 1 TABLET BY  MOUTH EVERY MORNING 90 tablet 0  . DULoxetine (CYMBALTA) 60 MG capsule TAKE 1 CAPSULE BY MOUTH DAILY 90 capsule 0  . levothyroxine (SYNTHROID, LEVOTHROID) 88 MCG tablet TAKE 1 TABLET BY MOUTH DAILY 90 tablet 3  . losartan (COZAAR) 100 MG tablet TAKE 1 TABLET BY MOUTH DAILY 90 tablet 0  . rosuvastatin (CRESTOR) 10 MG tablet TAKE 1 TABLET BY MOUTH DAILY 90 tablet 0  . zolpidem (AMBIEN) 5 MG tablet Take 1 tablet (5 mg total) by mouth at bedtime as needed for sleep. For travel . No more than 3 nights in a row (Patient not taking: Reported on 02/27/2018) 20 tablet 0   No facility-administered medications prior to visit.      EXAM:  BP (!) 150/80 (BP Location: Left Arm, Patient Position: Sitting, Cuff Size: Normal)   Pulse 88   Temp 98.2 F (36.8 C) (Oral)   SpO2 97%   There is no height or weight on file to calculate BMI.  GENERAL: vitals reviewed and listed above, alert, oriented, appears well hydrated and in no acute distress HEENT: atraumatic, conjunctiva  clear, no obvious abnormalities on  inspection of external nose and ears PSYCH: pleasant and cooperative, no obvious depression or anxiety Lab Results  Component Value Date   WBC 3.6 (L) 02/27/2018   HGB 12.7 02/27/2018   HCT 37.9 02/27/2018   PLT 338.0 02/27/2018   GLUCOSE 89 02/27/2018   CHOL 226 (H) 02/27/2018   TRIG 55.0 02/27/2018   HDL 117.00 02/27/2018   LDLDIRECT 146.3 09/17/2013   LDLCALC 98 02/27/2018   ALT 24 02/27/2018   AST 22 02/27/2018   NA 141 02/27/2018   K 4.4 02/27/2018   CL 104 02/27/2018   CREATININE 0.66 02/27/2018   BUN 14 02/27/2018   CO2 29 02/27/2018   TSH 1.05 02/27/2018   HGBA1C 5.3 03/14/2017   BP Readings from Last 3 Encounters:  05/20/18 (!) 150/80  02/27/18 126/70  08/30/17 (!) 142/78  .lastweight3 Wt Readings from Last 3 Encounters:  02/27/18 163 lb 6.4 oz (74.1 kg)  08/30/17 161 lb 6.4 oz (73.2 kg)  03/14/17 160 lb 3.2 oz (72.7 kg)     ASSESSMENT AND PLAN:  Discussed the following assessment and plan:  Medication management  Depressive disorder in remission  Essential hypertension  Subjective memory complaints  Alcohol use She has been in pretty good remission for a while on medications Cymbalta and Wellbutrin.  At this time is reasonable to wean the Cymbalta first and then stopping the Wellbutrin. Expectant management prescription for 30 mg Cymbalta to help with weaning and instructions. Plan follow-up in about a month discussed signing up for my chart for help.  If mood issues come back contact us. Discussed association with memory to optimize control blood pressure get enough sleep avoid medications that can cause cognitive fogging avoid falling continue aerobic exercise and address any cardiovascular risk factors.  She is planning on stopping alcohol and I agree as this can add to cognitive issues.  She looks well today. Total visit 25mins > 50% spent counseling and coordinating care as indicated in above note and in instructions to patient .   -Patient  advised to return or notify health care team  if  new concerns arise.  Patient Instructions  Decrease cymbalta to alternating 30 with 60 mg for 1-2 weeks then   30 mg per day  For  1-2  week s  Then alternate  Every other day  Off and 30 mg  And wean as tolerated .   Wellbutrin  Can do every day  Wait until getting down on the Cymbalta .   To at least 30 mg per day .    Contact us   Qwest CommunicationsWanda K. Gelsey Amyx M.D.

## 2018-05-20 ENCOUNTER — Ambulatory Visit (INDEPENDENT_AMBULATORY_CARE_PROVIDER_SITE_OTHER): Payer: Medicare Other | Admitting: Internal Medicine

## 2018-05-20 ENCOUNTER — Encounter: Payer: Self-pay | Admitting: Internal Medicine

## 2018-05-20 VITALS — BP 150/80 | HR 88 | Temp 98.2°F

## 2018-05-20 DIAGNOSIS — Z789 Other specified health status: Secondary | ICD-10-CM | POA: Diagnosis not present

## 2018-05-20 DIAGNOSIS — R4189 Other symptoms and signs involving cognitive functions and awareness: Secondary | ICD-10-CM | POA: Diagnosis not present

## 2018-05-20 DIAGNOSIS — F329 Major depressive disorder, single episode, unspecified: Secondary | ICD-10-CM

## 2018-05-20 DIAGNOSIS — F32A Depression, unspecified: Secondary | ICD-10-CM

## 2018-05-20 DIAGNOSIS — Z7289 Other problems related to lifestyle: Secondary | ICD-10-CM

## 2018-05-20 DIAGNOSIS — I10 Essential (primary) hypertension: Secondary | ICD-10-CM

## 2018-05-20 DIAGNOSIS — Z79899 Other long term (current) drug therapy: Secondary | ICD-10-CM

## 2018-05-20 MED ORDER — DULOXETINE HCL 30 MG PO CPEP: ORAL_CAPSULE | ORAL | 2 refills | Status: DC

## 2018-05-20 NOTE — Patient Instructions (Addendum)
Decrease cymbalta to alternating 30 with 60 mg for 1-2 weeks then   30 mg per day  For  1-2  week s  Then alternate  Every other day  Off and 30 mg  And wean as tolerated .   Wellbutrin  Can do every day  Wait until getting down on the Cymbalta .   To at least 30 mg per day .    Contact us

## 2018-05-31 DIAGNOSIS — N8111 Cystocele, midline: Secondary | ICD-10-CM | POA: Diagnosis not present

## 2018-06-20 ENCOUNTER — Other Ambulatory Visit: Payer: Self-pay | Admitting: Internal Medicine

## 2018-06-21 ENCOUNTER — Other Ambulatory Visit: Payer: Self-pay | Admitting: Internal Medicine

## 2018-06-24 ENCOUNTER — Ambulatory Visit: Payer: Medicare Other | Admitting: Internal Medicine

## 2018-06-24 DIAGNOSIS — Z0289 Encounter for other administrative examinations: Secondary | ICD-10-CM

## 2018-06-24 NOTE — Progress Notes (Deleted)
No chief complaint on file.   HPI: Elaine Howell 72 y.o. come in for Chronic disease management  ROS: See pertinent positives and negatives per HPI.  Past Medical History:  Diagnosis Date  . Chronic insomnia   . Depression   . Heavy alcohol use 07/27/2010   Qualifier: Diagnosis of  By: Fabian Sharp MD, Neta Mends Daily use     . Hyperlipidemia   . Hypertension   . Thyroiditis    positive antibodies    Family History  Problem Relation Age of Onset  . Heart disease Mother   . Alcohol abuse Father   . Heart disease Father   . Hypertension Sister     Social History   Socioeconomic History  . Marital status: Married    Spouse name: Not on file  . Number of children: Not on file  . Years of education: Not on file  . Highest education level: Not on file  Occupational History  . Not on file  Social Needs  . Financial resource strain: Not on file  . Food insecurity:    Worry: Not on file    Inability: Not on file  . Transportation needs:    Medical: Not on file    Non-medical: Not on file  Tobacco Use  . Smoking status: Former Smoker    Start date: 07/24/1964    Last attempt to quit: 07/25/1999    Years since quitting: 18.9  . Smokeless tobacco: Never Used  Substance and Sexual Activity  . Alcohol use: Yes    Alcohol/week: 14.0 standard drinks    Types: 14 Glasses of wine per week    Comment: heavy alcohol use  . Drug use: No  . Sexual activity: Not on file  Lifestyle  . Physical activity:    Days per week: Not on file    Minutes per session: Not on file  . Stress: Not on file  Relationships  . Social connections:    Talks on phone: Not on file    Gets together: Not on file    Attends religious service: Not on file    Active member of club or organization: Not on file    Attends meetings of clubs or organizations: Not on file    Relationship status: Not on file  Other Topics Concern  . Not on file  Social History Narrative      hh of 1   Part  time [employer] english as a second language    Now retired    [facility] of 1 no pets   G2P2    Outpatient Medications Prior to Visit  Medication Sig Dispense Refill  . amLODipine (NORVASC) 10 MG tablet Take 1 tablet (10 mg total) by mouth daily. 90 tablet 2  . buPROPion (WELLBUTRIN XL) 300 MG 24 hr tablet TAKE 1 TABLET BY MOUTH EVERY MORNING 90 tablet 0  . DULoxetine (CYMBALTA) 30 MG capsule Decrease cymbalta to alternating 30 with 60 mg for 1-2 weeks then  30 mg per day for  1-2  weeks 30 capsule 2  . DULoxetine (CYMBALTA) 60 MG capsule TAKE 1 CAPSULE BY MOUTH DAILY 90 capsule 0  . levothyroxine (SYNTHROID, LEVOTHROID) 88 MCG tablet TAKE 1 TABLET BY MOUTH DAILY 90 tablet 3  . losartan (COZAAR) 100 MG tablet TAKE 1 TABLET BY MOUTH DAILY 90 tablet 0  . rosuvastatin (CRESTOR) 10 MG tablet TAKE 1 TABLET BY MOUTH DAILY 90 tablet 0  . zolpidem (AMBIEN) 5 MG tablet Take  1 tablet (5 mg total) by mouth at bedtime as needed for sleep. For travel . No more than 3 nights in a row (Patient not taking: Reported on 02/27/2018) 20 tablet 0   No facility-administered medications prior to visit.      EXAM:  There were no vitals taken for this visit.  There is no height or weight on file to calculate BMI.  GENERAL: vitals reviewed and listed above, alert, oriented, appears well hydrated and in no acute distress HEENT: atraumatic, conjunctiva  clear, no obvious abnormalities on inspection of external nose and ears OP : no lesion edema or exudate  NECK: no obvious masses on inspection palpation  LUNGS: clear to auscultation bilaterally, no wheezes, rales or rhonchi, good air movement CV: HRRR, no clubbing cyanosis or  peripheral edema nl cap refill  MS: moves all extremities without noticeable focal  abnormality PSYCH: pleasant and cooperative, no obvious depression or anxiety Lab Results  Component Value Date   WBC 3.6 (L) 02/27/2018   HGB 12.7 02/27/2018   HCT 37.9 02/27/2018   PLT 338.0 02/27/2018    GLUCOSE 89 02/27/2018   CHOL 226 (H) 02/27/2018   TRIG 55.0 02/27/2018   HDL 117.00 02/27/2018   LDLDIRECT 146.3 09/17/2013   LDLCALC 98 02/27/2018   ALT 24 02/27/2018   AST 22 02/27/2018   NA 141 02/27/2018   K 4.4 02/27/2018   CL 104 02/27/2018   CREATININE 0.66 02/27/2018   BUN 14 02/27/2018   CO2 29 02/27/2018   TSH 1.05 02/27/2018   HGBA1C 5.3 03/14/2017   BP Readings from Last 3 Encounters:  05/20/18 (!) 150/80  02/27/18 126/70  08/30/17 (!) 142/78    ASSESSMENT AND PLAN:  Discussed the following assessment and plan:  Medication management  Essential hypertension  Depressive disorder in remission  -Patient advised to return or notify health care team  if  new concerns arise.  There are no Patient Instructions on file for this visit.   Neta MendsWanda K. Panosh M.D.   Depressive disorder in remission  Essential hypertension  Subjective memory complaints  Alcohol use She has been in pretty good remission for a while on medications Cymbalta and Wellbutrin.  At this time is reasonable to wean the Cymbalta first and then stopping the Wellbutrin. Expectant management prescription for 30 mg Cymbalta to help with weaning and instructions. Plan follow-up in about a month discussed signing up for my chart for help.  If mood issues come back contact us. Discussed association with memory to optimize control blood pressure get enough sleep avoid medications that can cause cognitive fogging avoid falling continue aerobic exercise and address any cardiovascular risk factors.  She is planning on stopping alcohol and I agree as this can add to cognitive issues.  She looks well today. Total visit 25mins > 50% spent counseling and coordinating care as indicated in above note and in instructions to patient .   -Patient advised to return or notify health care team  if  new concerns arise.  Patient Instructions  Decrease cymbalta to alternating 30 with 60 mg for 1-2 weeks then     30 mg per day  For  1-2  week s  Then alternate  Every other day  Off and 30 mg  And wean as tolerated .   Wellbutrin  Can do every day  Wait until getting down on the Cymbalta .   To at least 30 mg per day .    Contact us  Neta Mends. Panosh M.D.    Patient Instructions by Madelin Headings, MD at 05/20/2018 2:00 PM  Author: Madelin Headings, MD Author Type: Physician Filed: 05/20/2018 2:32 PM  Note Status: Addendum Cosign: Cosign Not Required Encounter Date: 05/20/2018  Editor: Madelin Headings, MD (Physician)  Prior Versions: 1. Madelin Headings, MD (Physician) at 05/20/2018 2:29 PM - Signed    Decrease cymbalta to alternating 30 with 60 mg for 1-2 weeks then   30 mg per day  For  1-2  week s  Then alternate  Every other day  Off and 30 mg  And wean as tolerated .   Wellbutrin  Can do every day  Wait until getting down on the Cymbalta .   To at least 30 mg per day .    Contact us

## 2018-06-25 NOTE — Telephone Encounter (Signed)
Amlodipine and Losartan refilled  Cannot refill Bupropion or Cymbalta until follow up appt since dose changed last OV and advised to follow for med check.  Instructions 05/20/18 Return in about 1 month (around 06/20/2018).  Decrease cymbalta to alternating 30 with 60 mg for 1-2 weeks then   30 mg per day  For  1-2  week s  Then alternate  Every other day  Off and 30 mg  And wean as tolerated .

## 2018-06-26 ENCOUNTER — Other Ambulatory Visit: Payer: Self-pay | Admitting: Internal Medicine

## 2018-07-11 DIAGNOSIS — M1711 Unilateral primary osteoarthritis, right knee: Secondary | ICD-10-CM | POA: Diagnosis not present

## 2018-07-11 DIAGNOSIS — M545 Low back pain: Secondary | ICD-10-CM | POA: Diagnosis not present

## 2018-07-20 ENCOUNTER — Other Ambulatory Visit: Payer: Self-pay | Admitting: Internal Medicine

## 2018-07-21 ENCOUNTER — Other Ambulatory Visit: Payer: Self-pay | Admitting: Internal Medicine

## 2018-07-24 NOTE — Telephone Encounter (Signed)
As of last TE for this refill, we are unable to refill at this time until the patient comes in for her medication follow up appt.   Pt needs OV!   Nadara Eaton, CMA 11:33 AM Note June 25, 2018  Amlodipine and Losartan refilled  Cannot refill Bupropion or Cymbalta until follow up appt since dose changed last OV and advised to follow for med check.  Instructions 05/20/18 Return in about 1 month (around 06/20/2018).  Decrease cymbalta to alternating 30 with 60 mg for 1-2 weeks then  30 mg per day For 1-2 week s  Then alternate Every other day Off and 30 mg And wean as tolerated .

## 2018-07-31 DIAGNOSIS — M48061 Spinal stenosis, lumbar region without neurogenic claudication: Secondary | ICD-10-CM | POA: Diagnosis not present

## 2018-07-31 DIAGNOSIS — M545 Low back pain: Secondary | ICD-10-CM | POA: Diagnosis not present

## 2018-07-31 DIAGNOSIS — M5136 Other intervertebral disc degeneration, lumbar region: Secondary | ICD-10-CM | POA: Diagnosis not present

## 2018-07-31 DIAGNOSIS — M419 Scoliosis, unspecified: Secondary | ICD-10-CM | POA: Diagnosis not present

## 2018-08-01 ENCOUNTER — Ambulatory Visit (INDEPENDENT_AMBULATORY_CARE_PROVIDER_SITE_OTHER): Payer: Medicare Other

## 2018-08-01 DIAGNOSIS — Z23 Encounter for immunization: Secondary | ICD-10-CM | POA: Diagnosis not present

## 2018-08-12 DIAGNOSIS — M545 Low back pain: Secondary | ICD-10-CM | POA: Diagnosis not present

## 2018-08-23 DIAGNOSIS — M5136 Other intervertebral disc degeneration, lumbar region: Secondary | ICD-10-CM | POA: Diagnosis not present

## 2018-09-09 DIAGNOSIS — N390 Urinary tract infection, site not specified: Secondary | ICD-10-CM | POA: Diagnosis not present

## 2018-09-09 DIAGNOSIS — R300 Dysuria: Secondary | ICD-10-CM | POA: Diagnosis not present

## 2018-09-19 ENCOUNTER — Other Ambulatory Visit: Payer: Self-pay | Admitting: Internal Medicine

## 2018-10-14 ENCOUNTER — Other Ambulatory Visit (HOSPITAL_COMMUNITY): Payer: Self-pay | Admitting: Specialist

## 2018-10-14 ENCOUNTER — Other Ambulatory Visit: Payer: Self-pay | Admitting: Specialist

## 2018-10-14 DIAGNOSIS — M5136 Other intervertebral disc degeneration, lumbar region: Secondary | ICD-10-CM

## 2018-10-17 ENCOUNTER — Other Ambulatory Visit: Payer: Self-pay | Admitting: Internal Medicine

## 2018-10-18 ENCOUNTER — Encounter (HOSPITAL_COMMUNITY)
Admission: RE | Admit: 2018-10-18 | Discharge: 2018-10-18 | Disposition: A | Discharge status: Home / Self Care | Payer: Medicare Other | Source: Ambulatory Visit | Attending: Specialist | Admitting: Specialist

## 2018-10-18 ENCOUNTER — Ambulatory Visit (HOSPITAL_COMMUNITY)
Admission: RE | Admit: 2018-10-18 | Discharge: 2018-10-18 | Disposition: A | Discharge status: Home / Self Care | Payer: Medicare Other | Source: Ambulatory Visit | Attending: Specialist | Admitting: Specialist

## 2018-10-18 DIAGNOSIS — M5136 Other intervertebral disc degeneration, lumbar region: Secondary | ICD-10-CM | POA: Insufficient documentation

## 2018-10-18 DIAGNOSIS — M47816 Spondylosis without myelopathy or radiculopathy, lumbar region: Secondary | ICD-10-CM | POA: Diagnosis not present

## 2018-10-18 MED ORDER — TECHNETIUM TC 99M MEDRONATE IV KIT
21.8000 | PACK | Freq: Once | INTRAVENOUS | Status: AC | PRN
  Administered 2018-10-18: 21.8 via INTRAVENOUS

## 2018-10-18 NOTE — Telephone Encounter (Signed)
Pt needs OV for further refills.  

## 2018-10-25 DIAGNOSIS — M545 Low back pain: Secondary | ICD-10-CM | POA: Diagnosis not present

## 2018-10-28 ENCOUNTER — Other Ambulatory Visit: Payer: Self-pay | Admitting: Internal Medicine

## 2018-10-29 NOTE — Telephone Encounter (Signed)
Patient would like the nurse to call her about this medication. [phone].

## 2018-10-30 ENCOUNTER — Ambulatory Visit: Payer: Self-pay

## 2018-10-30 DIAGNOSIS — M545 Low back pain: Secondary | ICD-10-CM | POA: Diagnosis not present

## 2018-10-30 NOTE — Telephone Encounter (Signed)
Summary: Medication Management   Pt stated she is weaning herself off of DULoxetine HCl. However she ended up taking 60MG  and then stopped taking it all together. She has not taken it for 10 days and does not know how to proceed. Please advise.

## 2018-10-30 NOTE — Telephone Encounter (Signed)
She may have withdrawal sx bu stopping  All together  I advise decreasing dose  Cymbalta  20 mg 1 per day for 1 weeks then every other day for 1 weeks then  Every 3 days for 1 week and stop .   plese send in cymbalta 20 mg  Disp 30 refill x 2   Take  1 po qd and wean as directed

## 2018-10-30 NOTE — Progress Notes (Signed)
Chief Complaint  Patient presents with  . Follow-up    Cymbalta.Pt is stating she took the 60mg  by mistake and not the 30 mg and completely stopped taking it and went into an "emotional wack out"    HPI: Elaine Howell 73 y.o. come in for  Med managemtnt see message  Last seen aug 19  Disc weaning  cymbalta   ranout of 30 and accidentally taking 60 for a week and then reaslixed was out of 30 mg which she had tolerated for about a month   Off for 10 days has had crying emotional lability and depressive sx  So went back on 60 for 2 days   See message   Feels slightly better.   Taking thyroid ok   No other  Issues x b day coming up .  ROS: See pertinent positives and negatives per HPI. Knee issues   Going to PT   Past Medical History:  Diagnosis Date  . Chronic insomnia   . Depression   . Heavy alcohol use 07/27/2010   Qualifier: Diagnosis of  By: Fabian SharpPanosh MD, Neta MendsWanda K Daily use     . Hyperlipidemia   . Hypertension   . Thyroiditis    positive antibodies    Family History  Problem Relation Age of Onset  . Heart disease Mother   . Alcohol abuse Father   . Heart disease Father   . Hypertension Sister     Social History   Socioeconomic History  . Marital status: Married    Spouse name: Not on file  . Number of children: Not on file  . Years of education: Not on file  . Highest education level: Not on file  Occupational History  . Not on file  Social Needs  . Financial resource strain: Not on file  . Food insecurity:    Worry: Not on file    Inability: Not on file  . Transportation needs:    Medical: Not on file    Non-medical: Not on file  Tobacco Use  . Smoking status: Former Smoker    Start date: 07/24/1964    Last attempt to quit: 07/25/1999    Years since quitting: 19.2  . Smokeless tobacco: Never Used  Substance and Sexual Activity  . Alcohol use: Yes    Alcohol/week: 14.0 standard drinks    Types: 14 Glasses of wine per week    Comment: heavy  alcohol use  . Drug use: No  . Sexual activity: Not on file  Lifestyle  . Physical activity:    Days per week: Not on file    Minutes per session: Not on file  . Stress: Not on file  Relationships  . Social connections:    Talks on phone: Not on file    Gets together: Not on file    Attends religious service: Not on file    Active member of club or organization: Not on file    Attends meetings of clubs or organizations: Not on file    Relationship status: Not on file  Other Topics Concern  . Not on file  Social History Narrative      hh of 1   Part time [employer] english as a second language    Now retired    [facility] no pets   G2P2    Outpatient Medications Prior to Visit  Medication Sig Dispense Refill  . amLODipine (NORVASC) 10 MG tablet TAKE 1 TABLET BY MOUTH EVERY  DAY 90 tablet 0  . buPROPion (WELLBUTRIN XL) 300 MG 24 hr tablet Take 1 tablet (300 mg total) by mouth every morning. Needs Office visit for further refills 30 tablet 0  . DULoxetine (CYMBALTA) 60 MG capsule Take 1 capsule (60 mg total) by mouth daily. **DUE FOR MEDICATION CHECK APPT 30 capsule 0  . levothyroxine (SYNTHROID, LEVOTHROID) 88 MCG tablet TAKE 1 TABLET BY MOUTH DAILY 90 tablet 3  . losartan (COZAAR) 100 MG tablet TAKE 1 TABLET BY MOUTH EVERY DAY 90 tablet 0  . rosuvastatin (CRESTOR) 10 MG tablet TAKE 1 TABLET BY MOUTH EVERY DAY 90 tablet 0  . DULoxetine (CYMBALTA) 20 MG capsule Take 1 capsule (20 mg total) by mouth daily. 20 mg 1 per day for 1 weeks then every other day for 1 weeks then  Every 3 days for 1 week and stop . 30 capsule 1  . DULoxetine (CYMBALTA) 30 MG capsule Decrease cymbalta to alternating 30 with 60 mg for 1-2 weeks then  30 mg per day for  1-2  weeks 30 capsule 2  . zolpidem (AMBIEN) 5 MG tablet Take 1 tablet (5 mg total) by mouth at bedtime as needed for sleep. For travel . No more than 3 nights in a row (Patient not taking: Reported on 02/27/2018) 20 tablet 0   No facility-administered  medications prior to visit.      EXAM:  BP 122/86 (BP Location: Right Arm, Patient Position: Sitting, Cuff Size: Normal)   Pulse 85   Temp 97.8 F (36.6 C) (Oral)   SpO2 94%   There is no height or weight on file to calculate BMI.  GENERAL: vitals reviewed and listed above, alert, oriented, appears well hydrated and in no acute distress HEENT: atraumatic, conjunctiva  clear, PSYCH: pleasant and cooperative, no obvious depression or anxiety cognition intact no tremor   BP Readings from Last 3 Encounters:  10/31/18 122/86  05/20/18 (!) 150/80  02/27/18 126/70   Wt Readings from Last 3 Encounters:  02/27/18 163 lb 6.4 oz (74.1 kg)  08/30/17 161 lb 6.4 oz (73.2 kg)  03/14/17 160 lb 3.2 oz (72.7 kg)     ASSESSMENT AND PLAN:  Discussed the following assessment and plan:  Medication withdrawal  side effect - see text   Medication management - Plan: Basic metabolic panel, CBC with Differential/Platelet, Hepatic function panel, Lipid panel, TSH  Hypothyroidism, unspecified type - Plan: Basic metabolic panel, CBC with Differential/Platelet, Hepatic function panel, Lipid panel, TSH  Essential hypertension - Plan: Basic metabolic panel, CBC with Differential/Platelet, Hepatic function panel, Lipid panel, TSH  Hyperlipidemia, unspecified hyperlipidemia type - Plan: Basic metabolic panel, CBC with Differential/Platelet, Hepatic function panel, Lipid panel, TSH Most likely  withdrawal sx   As discussed and plan for  Restart   See  Instructions .   60 and down back to 30 as tolerated and then ask .    Disc sign up for my chart and other avenues .  Do not fill the 20 mg but the 30 mg  Stay on wellbutrin  Plan cpx and lab pre visit in June. Total visit > 50% spent counseling and coordinating care as indicated in above note and in instructions to patient .        Expectant management. About  snri wd sx  -Patient advised to return or notify health care team  if  new concerns  arise.  Patient Instructions   Get back on the 60 mg  Until feeling  better  And then wean aalternating and then 30 mg per day .  You can remain on 30 mg   And if want to wean more then  contcat us for instructions  For 20 mg  Dosage.    PV cpx  In June     Alfretta Pinch K. Zackory Pudlo M.D.

## 2018-10-31 ENCOUNTER — Ambulatory Visit (INDEPENDENT_AMBULATORY_CARE_PROVIDER_SITE_OTHER): Payer: Medicare Other | Admitting: Internal Medicine

## 2018-10-31 ENCOUNTER — Other Ambulatory Visit: Payer: Self-pay

## 2018-10-31 ENCOUNTER — Encounter: Payer: Self-pay | Admitting: Internal Medicine

## 2018-10-31 VITALS — BP 122/86 | HR 85 | Temp 97.8°F

## 2018-10-31 DIAGNOSIS — E785 Hyperlipidemia, unspecified: Secondary | ICD-10-CM

## 2018-10-31 DIAGNOSIS — Z79899 Other long term (current) drug therapy: Secondary | ICD-10-CM

## 2018-10-31 DIAGNOSIS — I10 Essential (primary) hypertension: Secondary | ICD-10-CM | POA: Diagnosis not present

## 2018-10-31 DIAGNOSIS — E039 Hypothyroidism, unspecified: Secondary | ICD-10-CM

## 2018-10-31 DIAGNOSIS — T887XXA Unspecified adverse effect of drug or medicament, initial encounter: Secondary | ICD-10-CM

## 2018-10-31 MED ORDER — DULOXETINE HCL 20 MG PO CPEP: 20.0000 mg | ORAL_CAPSULE | Freq: Every day | ORAL | 1 refills | Status: DC

## 2018-10-31 MED ORDER — DULOXETINE HCL 30 MG PO CPEP: ORAL_CAPSULE | ORAL | 5 refills | Status: DC

## 2018-10-31 NOTE — Patient Instructions (Addendum)
  Get back on the 60 mg  Until feeling better  And then wean aalternating and then 30 mg per day .  You can remain on 30 mg   And if want to wean more then  contcat [place] for instructions  For 20 mg  Dosage.    PV cpx  In June

## 2018-10-31 NOTE — Telephone Encounter (Signed)
Left detailed message for pt. Pt has appt today with Dr.panosh

## 2018-11-05 ENCOUNTER — Other Ambulatory Visit: Payer: Self-pay | Admitting: Internal Medicine

## 2018-11-06 DIAGNOSIS — M545 Low back pain: Secondary | ICD-10-CM | POA: Diagnosis not present

## 2018-11-08 DIAGNOSIS — M545 Low back pain: Secondary | ICD-10-CM | POA: Diagnosis not present

## 2018-11-21 ENCOUNTER — Other Ambulatory Visit: Payer: Self-pay | Admitting: Internal Medicine

## 2018-11-21 DIAGNOSIS — M545 Low back pain: Secondary | ICD-10-CM | POA: Diagnosis not present

## 2018-11-25 DIAGNOSIS — M545 Low back pain: Secondary | ICD-10-CM | POA: Diagnosis not present

## 2018-11-28 DIAGNOSIS — M545 Low back pain: Secondary | ICD-10-CM | POA: Diagnosis not present

## 2018-12-02 DIAGNOSIS — M8580 Other specified disorders of bone density and structure, unspecified site: Secondary | ICD-10-CM | POA: Diagnosis not present

## 2018-12-02 DIAGNOSIS — M5136 Other intervertebral disc degeneration, lumbar region: Secondary | ICD-10-CM | POA: Diagnosis not present

## 2018-12-02 DIAGNOSIS — M545 Low back pain: Secondary | ICD-10-CM | POA: Diagnosis not present

## 2018-12-02 DIAGNOSIS — G548 Other nerve root and plexus disorders: Secondary | ICD-10-CM | POA: Diagnosis not present

## 2018-12-02 DIAGNOSIS — M419 Scoliosis, unspecified: Secondary | ICD-10-CM | POA: Diagnosis not present

## 2018-12-12 DIAGNOSIS — M545 Low back pain: Secondary | ICD-10-CM | POA: Diagnosis not present

## 2018-12-17 ENCOUNTER — Other Ambulatory Visit: Payer: Self-pay | Admitting: Internal Medicine

## 2018-12-18 ENCOUNTER — Other Ambulatory Visit: Payer: Self-pay | Admitting: Internal Medicine

## 2019-01-17 ENCOUNTER — Other Ambulatory Visit: Payer: Self-pay | Admitting: Internal Medicine

## 2019-02-16 ENCOUNTER — Other Ambulatory Visit: Payer: Self-pay | Admitting: Internal Medicine

## 2019-03-04 ENCOUNTER — Other Ambulatory Visit (INDEPENDENT_AMBULATORY_CARE_PROVIDER_SITE_OTHER): Payer: Medicare Other

## 2019-03-04 ENCOUNTER — Other Ambulatory Visit: Payer: Self-pay

## 2019-03-04 DIAGNOSIS — Z79899 Other long term (current) drug therapy: Secondary | ICD-10-CM

## 2019-03-04 DIAGNOSIS — I10 Essential (primary) hypertension: Secondary | ICD-10-CM | POA: Diagnosis not present

## 2019-03-04 DIAGNOSIS — E785 Hyperlipidemia, unspecified: Secondary | ICD-10-CM

## 2019-03-04 DIAGNOSIS — E039 Hypothyroidism, unspecified: Secondary | ICD-10-CM

## 2019-03-04 LAB — HEPATIC FUNCTION PANEL
ALT: 31 U/L (ref 0–35)
AST: 24 U/L (ref 0–37)
Albumin: 4.4 g/dL (ref 3.5–5.2)
Alkaline Phosphatase: 81 U/L (ref 39–117)
Bilirubin, Direct: 0.1 mg/dL (ref 0.0–0.3)
Total Bilirubin: 0.4 mg/dL (ref 0.2–1.2)
Total Protein: 6.7 g/dL (ref 6.0–8.3)

## 2019-03-04 LAB — BASIC METABOLIC PANEL
BUN: 15 mg/dL (ref 6–23)
CO2: 28 mEq/L (ref 19–32)
Calcium: 9.6 mg/dL (ref 8.4–10.5)
Chloride: 103 mEq/L (ref 96–112)
Creatinine, Ser: 0.63 mg/dL (ref 0.40–1.20)
GFR: 92.04 mL/min (ref >60.00)
Glucose, Bld: 85 mg/dL (ref 70–99)
Potassium: 4.5 mEq/L (ref 3.5–5.1)
Sodium: 140 mEq/L (ref 135–145)

## 2019-03-04 LAB — LIPID PANEL
Cholesterol: 221 mg/dL — ABNORMAL HIGH (ref 0–200)
HDL: 105.8 mg/dL (ref >39.00)
LDL Cholesterol: 101 mg/dL — ABNORMAL HIGH (ref 0–99)
NonHDL: 115.22
Total CHOL/HDL Ratio: 2
Triglycerides: 69 mg/dL (ref 0.0–149.0)
VLDL: 13.8 mg/dL (ref 0.0–40.0)

## 2019-03-04 LAB — CBC WITH DIFFERENTIAL/PLATELET
Basophils Absolute: 0 10*3/uL (ref 0.0–0.1)
Basophils Relative: 1.3 % (ref 0.0–3.0)
Eosinophils Absolute: 0.2 10*3/uL (ref 0.0–0.7)
Eosinophils Relative: 4.5 % (ref 0.0–5.0)
HCT: 38.3 % (ref 36.0–46.0)
Hemoglobin: 13.1 g/dL (ref 12.0–15.0)
Lymphocytes Relative: 40.3 % (ref 12.0–46.0)
Lymphs Abs: 1.5 10*3/uL (ref 0.7–4.0)
MCHC: 34.2 g/dL (ref 30.0–36.0)
MCV: 93.4 fl (ref 78.0–100.0)
Monocytes Absolute: 0.4 10*3/uL (ref 0.1–1.0)
Monocytes Relative: 10.1 % (ref 3.0–12.0)
Neutro Abs: 1.6 10*3/uL (ref 1.4–7.7)
Neutrophils Relative %: 43.8 % (ref 43.0–77.0)
Platelets: 344 10*3/uL (ref 150.0–400.0)
RBC: 4.1 Mil/uL (ref 3.87–5.11)
RDW: 14.3 % (ref 11.5–15.5)
WBC: 3.7 10*3/uL — ABNORMAL LOW (ref 4.0–10.5)

## 2019-03-04 LAB — TSH: TSH: 1.95 u[IU]/mL (ref 0.35–4.50)

## 2019-03-10 ENCOUNTER — Telehealth: Payer: Self-pay

## 2019-03-10 NOTE — Telephone Encounter (Signed)
lvm to screen pt and give number to call for when she arrives tomorrow for cpe

## 2019-03-11 ENCOUNTER — Encounter: Payer: Self-pay | Admitting: Internal Medicine

## 2019-03-11 ENCOUNTER — Ambulatory Visit (INDEPENDENT_AMBULATORY_CARE_PROVIDER_SITE_OTHER): Payer: Medicare Other | Admitting: Internal Medicine

## 2019-03-11 ENCOUNTER — Other Ambulatory Visit: Payer: Self-pay

## 2019-03-11 VITALS — BP 130/72 | HR 95 | Temp 97.7°F | Ht 62.5 in | Wt 171.0 lb

## 2019-03-11 DIAGNOSIS — Z0000 Encounter for general adult medical examination without abnormal findings: Secondary | ICD-10-CM

## 2019-03-11 DIAGNOSIS — I10 Essential (primary) hypertension: Secondary | ICD-10-CM

## 2019-03-11 DIAGNOSIS — E039 Hypothyroidism, unspecified: Secondary | ICD-10-CM

## 2019-03-11 DIAGNOSIS — E785 Hyperlipidemia, unspecified: Secondary | ICD-10-CM

## 2019-03-11 DIAGNOSIS — Z974 Presence of external hearing-aid: Secondary | ICD-10-CM

## 2019-03-11 DIAGNOSIS — E2839 Other primary ovarian failure: Secondary | ICD-10-CM

## 2019-03-11 DIAGNOSIS — Z79899 Other long term (current) drug therapy: Secondary | ICD-10-CM

## 2019-03-11 MED ORDER — DULOXETINE HCL 20 MG PO CPEP: 20.0000 mg | ORAL_CAPSULE | Freq: Every day | ORAL | 5 refills | Status: DC

## 2019-03-11 NOTE — Patient Instructions (Signed)
Can decrease the cymbalta to 20 mg every other day  Stay on  Cholesterol mediction  And thyroid med.   Continue  Healthy weight control.  [facility]   When convenient .  Will order cologuard for  Colon cancer screening.    shingrix   At your pharmacy . Yearly flu vaccine .   ro cpx  In 1 year or as needed .   Bone Health Bones protect organs, store calcium, anchor muscles, and support the whole body. Keeping your bones strong is important, especially as you get older. You can take actions to help keep your bones strong and healthy. Why is keeping my bones healthy important?  Keeping your bones healthy is important because your body constantly replaces bone cells. Cells get old, and new cells take their place. As we age, we lose bone cells because the body may not be able to make enough new cells to replace the old cells. The amount of bone cells and bone tissue you have is referred to as bone mass. The higher your bone mass, the stronger your bones. The aging process leads to an overall loss of bone mass in the body, which can increase the likelihood of:  Joint pain and stiffness.  Broken bones.  A condition in which the bones become weak and brittle (osteoporosis). A large decline in bone mass occurs in older adults. In women, it occurs about the time of menopause. What actions can I take to keep my bones healthy? Good health habits are important for maintaining healthy bones. This includes eating nutritious foods and exercising regularly. To have healthy bones, you need to get enough of the right minerals and vitamins. Most nutrition experts recommend getting these nutrients from the foods that you eat. In some cases, taking supplements may also be recommended. Doing certain types of exercise is also important for bone health. What are the nutritional recommendations for healthy bones?  Eating a well-balanced diet with plenty of calcium and vitamin D  will help to protect your bones. Nutritional recommendations vary from person to person. Ask your health care provider what is healthy for you. Here are some general guidelines. Get enough calcium Calcium is the most important (essential) mineral for bone health. Most people can get enough calcium from their diet, but supplements may be recommended for people who are at risk for osteoporosis. Good sources of calcium include:  Dairy products, such as low-fat or nonfat milk, cheese, and yogurt.  Dark green leafy vegetables, such as bok choy and broccoli.  Calcium-fortified foods, such as orange juice, cereal, bread, soy beverages, and tofu products.  Nuts, such as almonds. Follow these recommended amounts for daily calcium intake:  Children, age 25-3: 700 mg.  Children, age 34-8: 1,000 mg.  Children, age 729-13: 1,300 mg.  Teens, age 75-18: 1,300 mg.  Adults, age 75-50: 1,000 mg.  Adults, age 75-70: ? Men: 1,000 mg. ? Women: 1,200 mg.  Adults, age 75 or older: 1,200 mg.  Pregnant and breastfeeding females: ? Teens: 1,300 mg. ? Adults: 1,000 mg. Get enough vitamin D Vitamin D is the most essential vitamin for bone health. It helps the body absorb calcium. Sunlight stimulates the skin to make vitamin D, so be sure to get enough sunlight. If you live in a cold climate or you do not get outside often, your health care provider may recommend that you take vitamin D supplements. Good sources of vitamin D in your diet include:  Egg yolks.  Saltwater fish.  Milk and cereal fortified with vitamin D. Follow these recommended amounts for daily vitamin D intake:  Children and teens, age 20-18: 600 international units.  Adults, age 39 or younger: 400-800 international units.  Adults, age 51 or older: 800-1,000 international units. Get other important nutrients Other nutrients that are important for bone health include:  Phosphorus. This mineral is found in meat, poultry, dairy foods,  nuts, and legumes. The recommended daily intake for adult men and adult women is 700 mg.  Magnesium. This mineral is found in seeds, nuts, dark green vegetables, and legumes. The recommended daily intake for adult men is 400-420 mg. For adult women, it is 310-320 mg.  Vitamin K. This vitamin is found in green leafy vegetables. The recommended daily intake is 120 mg for adult men and 90 mg for adult women. What type of physical activity is best for building and maintaining healthy bones? Weight-bearing and strength-building activities are important for building and maintaining healthy bones. Weight-bearing activities cause muscles and bones to work against gravity. Strength-building activities increase the strength of the muscles that support bones. Weight-bearing and muscle-building activities include:  Walking and hiking.  Jogging and running.  Dancing.  Gym exercises.  Lifting weights.  Tennis and racquetball.  Climbing stairs.  Aerobics. Adults should get at least 30 minutes of moderate physical activity on most days. Children should get at least 60 minutes of moderate physical activity on most days. Ask your health care provider what type of exercise is best for you. How can I find out if my bone mass is low? Bone mass can be measured with an X-ray test called a bone mineral density (BMD) test. This test is recommended for all women who are age 37 or older. It may also be recommended for:  Men who are age 67 or older.  People who are at risk for osteoporosis because of: ? Having bones that break easily. ? Having a long-term disease that weakens bones, such as kidney disease or rheumatoid arthritis. ? Having menopause earlier than normal. ? Taking medicine that weakens bones, such as steroids, thyroid hormones, or hormone treatment for breast cancer or prostate cancer. ? Smoking. ? Drinking three or more alcoholic drinks a day. If you find that you have a low bone mass, you may  be able to prevent osteoporosis or further bone loss by changing your diet and lifestyle. Where can I find more information? For more information, check out the following websites:  [place]: AviationTales.fr  [employer] of Health: www.bones.SouthExposed.es  International Osteoporosis Foundation: Administrator.iofbonehealth.org Summary  The aging process leads to an overall loss of bone mass in the body, which can increase the likelihood of broken bones and osteoporosis.  Eating a well-balanced diet with plenty of calcium and vitamin D will help to protect your bones.  Weight-bearing and strength-building activities are also important for building and maintaining strong bones.  Bone mass can be measured with an X-ray test called a bone mineral density (BMD) test. This information is not intended to replace advice given to you by your health care provider. Make sure you discuss any questions you have with your health care provider. Document Released: 12/09/2003 Document Revised: 10/15/2017 Document Reviewed: 10/15/2017 Elsevier Interactive Patient Education  2019 [employer].

## 2019-03-11 NOTE — Progress Notes (Signed)
Chief Complaint  Patient presents with  . Annual Exam    Pt has no concerns   . Medication Management  . Hypothyroidism  . Hyperlipidemia    HPI: Elaine Howell 73 y.o. comes in today for Preventive Medicare exam/ wellness visit . And Chronic disease management Since last visit. BP  Reported in range  HLD taking crestor  10 per day wo se  Mood meds management  Better  Down to 30 qod . cymbalta  Can she wean more? THyroid Howell meds  Working on weight loss  Back problematic  Health Maintenance  Topic Date Due  . FOOT EXAM  11/03/1955  . OPHTHALMOLOGY EXAM  11/03/1955  . COLONOSCOPY  05/16/2015  . HEMOGLOBIN A1C  09/13/2017  . INFLUENZA VACCINE  05/03/2019  . TETANUS/TDAP  10/20/2024  . DEXA SCAN  Completed  . Hepatitis C Screening  Completed  . PNA vac Low Risk Adult  Completed   Health Maintenance Review LIFESTYLE:  Exercise:   covid back  Barriers  Tobacco/ETS: no Alcohol: no etooh for  For a week  Has cut down  Sugar beverages:n Sleep:  Good but a night owl.  Drug use: no HH: 1   No pets    Hearing: hearing aids   Vision:  No limitations at present . Last eye check UTD  Safety:  Has smoke detector and wears seat belts.   No excess sun exposure. Sees dentist regularly.  Falls: n  Advance directive :  Reviewed    Memory: Felt to be good  , no concern from her or her family.  Depression: No anhedonia unusual crying or depressive symptoms doing better   Nutrition: Eats well balanced diet;  Disc adequate calcium and vitamin D. No swallowing chewing problems.  Injury: no major injuries in the last six months.  Other healthcare providers:  Reviewed today .  Social: hh of 1   No pets  Meets with sis and friends   Preventive parameters: up-to-date  Reviewed   ADLS:   There are no problems or need for assistance  driving, feeding, obtaining food, dressing, toileting and bathing, managing money using phone. She is independent.  etoh   ROS:  Back  is problematic and  Seeing dr Shelle IronBeane  GEN/ HEENT: No fever, significant weight changes sweats headaches vision problems hearing changes, CV/ PULM; No chest pain shortness of breath cough, syncope,edema  change in exercise tolerance. GI /GU: No adominal pain, vomiting, change in bowel habits. No blood in the stool. No significant GU symptoms. SKIN/HEME: ,no acute skin rashes suspicious lesions or bleeding. No lymphadenopathy, nodules, masses.  NEURO/ PSYCH:  No neurologic signs such as weakness numbness. No depression anxiety. IMM/ Allergy: No unusual infections.  Allergy .   REST of 12 system review negative except as per HPI   Past Medical History:  Diagnosis Date  . Chronic insomnia   . Depression   . Heavy alcohol use 07/27/2010   Qualifier: Diagnosis of  By: Fabian SharpPanosh MD, Neta MendsWanda K Howell use     . Hyperlipidemia   . Hypertension   . Thyroiditis    positive antibodies    Family History  Problem Relation Age of Onset  . Heart disease Mother   . Alcohol abuse Father   . Heart disease Father   . Hypertension Sister     Social History   Socioeconomic History  . Marital status: Married    Spouse name: Not on file  . Number of children:  Not on file  . Years of education: Not on file  . Highest education level: Not on file  Occupational History  . Not on file  Social Needs  . Financial resource strain: Not on file  . Food insecurity:    Worry: Not on file    Inability: Not on file  . Transportation needs:    Medical: Not on file    Non-medical: Not on file  Tobacco Use  . Smoking status: Former Smoker    Start date: 07/24/1964    Last attempt to quit: 07/25/1999    Years since quitting: 19.6  . Smokeless tobacco: Never Used  Substance and Sexual Activity  . Alcohol use: Yes    Alcohol/week: 14.0 standard drinks    Types: 14 Glasses of wine per week    Comment: heavy alcohol use  . Drug use: No  . Sexual activity: Not on file  Lifestyle  . Physical activity:     Days per week: Not on file    Minutes per session: Not on file  . Stress: Not on file  Relationships  . Social connections:    Talks on phone: Not on file    Gets together: Not on file    Attends religious service: Not on file    Active member of club or organization: Not on file    Attends meetings of clubs or organizations: Not on file    Relationship status: Not on file  Other Topics Concern  . Not on file  Social History Narrative      hh of 1   Part time [employer] english as a second language    Now retired    [facility] of 1 no pets   G2P2    Outpatient Encounter Medications as of 03/11/2019  Medication Sig  . amLODipine (NORVASC) 10 MG tablet TAKE 1 TABLET BY MOUTH EVERY DAY  . buPROPion (WELLBUTRIN XL) 300 MG 24 hr tablet TAKE 1 TABLET BY MOUTH EVERY MORNING  . levothyroxine (SYNTHROID, LEVOTHROID) 88 MCG tablet TAKE 1 TABLET BY MOUTH Howell  . losartan (COZAAR) 100 MG tablet TAKE 1 TABLET BY MOUTH EVERY DAY  . rosuvastatin (CRESTOR) 10 MG tablet TAKE 1 TABLET BY MOUTH EVERY DAY  . zolpidem (AMBIEN) 5 MG tablet Take 1 tablet (5 mg total) by mouth at bedtime as needed for sleep. For travel . No more than 3 nights in a row  . [DISCONTINUED] DULoxetine (CYMBALTA) 30 MG capsule Decrease cymbalta to alternating 30 with 60 mg for 1-2 weeks then  30 mg per day and wean as directed  . [DISCONTINUED] DULoxetine (CYMBALTA) 60 MG capsule Take 1 capsule (60 mg total) by mouth Howell. **DUE FOR MEDICATION CHECK APPT  . DULoxetine (CYMBALTA) 20 MG capsule Take 1 capsule (20 mg total) by mouth Howell. Decrease to every other day and wean as tolerated   No facility-administered encounter medications on file as of 03/11/2019.     EXAM:  BP 130/72 (BP Location: Right Arm, Patient Position: Sitting, Cuff Size: Normal)   Pulse 95   Temp 97.7 F (36.5 C) (Temporal)   Ht 5' 2.5" (1.588 m)   Wt 171 lb (77.6 kg)   SpO2 97%   BMI 30.78 kg/m   Body mass index is 30.78 kg/m.  Physical Exam: Vital signs  reviewed TKZ:SWFU is a well-developed well-nourished alert cooperative   who appears stated age in no acute distress.  HEENT: normocephalic atraumatic , Eyes: PERRL EOM's full, conjunctiva clear, Nares: paten,t  no deformity discharge or tenderness., Ears: no deformity EAC's clear TMs with normal landmarks. Mouth: clear OP, no lesions, edema.  Moist mucous membranes. Dentition in adequate repair. NECK: supple without masses, thyromegaly or bruits. CHEST/PULM:  Clear to auscultation and percussion breath sounds equal no wheeze , rales or rhonchi. No chest wall deformities or tenderness. Breast exam deferred  Per apt to do per gyne  CV: PMI is nondisplaced, S1 S2 no gallops, murmurs, rubs. Peripheral pulses are full without delay.No JVD .  ABDOMEN: Bowel sounds normal nontender  No guard or rebound, no hepato splenomegal no CVA tenderness.   Extremtities:  No clubbing cyanosis or edema, no acute joint swelling or redness no focal atrophy puffy feet no edema  NEURO:  Oriented x3, cranial nerves 3-12 appear to be intact, no obvious focal weakness,gait within normal limits no abnormal reflexes or asymmetrical SKIN: No acute rashes normal turgor, color, no bruising or petechiae. PSYCH: Oriented, good eye contact, no obvious depression anxiety, cognition and judgment appear normal. LN: no cervical axillary adenopathy No noted deficits in memory, attention, and speech.   Lab Results  Component Value Date   WBC 3.7 (L) 03/04/2019   HGB 13.1 03/04/2019   HCT 38.3 03/04/2019   PLT 344.0 03/04/2019   GLUCOSE 85 03/04/2019   CHOL 221 (H) 03/04/2019   TRIG 69.0 03/04/2019   HDL 105.80 03/04/2019   LDLDIRECT 146.3 09/17/2013   LDLCALC 101 (H) 03/04/2019   ALT 31 03/04/2019   AST 24 03/04/2019   NA 140 03/04/2019   K 4.5 03/04/2019   CL 103 03/04/2019   CREATININE 0.63 03/04/2019   BUN 15 03/04/2019   CO2 28 03/04/2019   TSH 1.95 03/04/2019   HGBA1C 5.3 03/14/2017   Wt Readings from Last 3  Encounters:  03/11/19 171 lb (77.6 kg)  02/27/18 163 lb 6.4 oz (74.1 kg)  08/30/17 161 lb 6.4 oz (73.2 kg)    ASSESSMENT AND PLAN:  Discussed the following assessment and plan:  Visit for preventive health examination  Medication management  Wears hearing aid  Hypothyroidism, unspecified type  Essential hypertension  Hyperlipidemia, unspecified hyperlipidemia type  Estrogen deficiency - Plan: DG Bone Density tsh  In range  ldl down from peak 191 could be better but  Ok on crestor  bp  In range  Not diabetic and  Not sure why in record  dexa when  Appropriate  dexa ordered last  In 2017 and osteopenia   Disc opt ca vit d in idet supp if needed  Weaning from cymbalta seems to be doing ok  Dec to 20 mg and qod and wean as tolerated  Agree with etoh moderation Order cologuard  For colon screening Patient Care Team: Madelin Headings, MD as PCP - General Jethro Bolus, MD as Attending Physician (Ophthalmology) Shea Evans, MD as Attending Physician (Obstetrics and Gynecology)  Patient Instructions  Can decrease the cymbalta to 20 mg every other day  Stay on  Cholesterol mediction  And thyroid med.   Continue  Healthy weight control.  Get appt for DEXA scan at [facility]ology   When convenient .  Will order cologuard for  Colon cancer screening.    shingrix   At your pharmacy . Yearly flu vaccine .   ro cpx  In 1 year or as needed .   Bone Health Bones protect organs, store calcium, anchor muscles, and support the whole body. Keeping your bones strong is important, especially as you get older. You  can take actions to help keep your bones strong and healthy. Why is keeping my bones healthy important?  Keeping your bones healthy is important because your body constantly replaces bone cells. Cells get old, and new cells take their place. As we age, we lose bone cells because the body may not be able to make enough new cells to replace the old cells. The amount of  bone cells and bone tissue you have is referred to as bone mass. The higher your bone mass, the stronger your bones. The aging process leads to an overall loss of bone mass in the body, which can increase the likelihood of:  Joint pain and stiffness.  Broken bones.  A condition in which the bones become weak and brittle (osteoporosis). A large decline in bone mass occurs in older adults. In women, it occurs about the time of menopause. What actions can I take to keep my bones healthy? Good health habits are important for maintaining healthy bones. This includes eating nutritious foods and exercising regularly. To have healthy bones, you need to get enough of the right minerals and vitamins. Most nutrition experts recommend getting these nutrients from the foods that you eat. In some cases, taking supplements may also be recommended. Doing certain types of exercise is also important for bone health. What are the nutritional recommendations for healthy bones?  Eating a well-balanced diet with plenty of calcium and vitamin D will help to protect your bones. Nutritional recommendations vary from person to person. Ask your health care provider what is healthy for you. Here are some general guidelines. Get enough calcium Calcium is the most important (essential) mineral for bone health. Most people can get enough calcium from their diet, but supplements may be recommended for people who are at risk for osteoporosis. Good sources of calcium include:  Dairy products, such as low-fat or nonfat milk, cheese, and yogurt.  Dark green leafy vegetables, such as bok choy and broccoli.  Calcium-fortified foods, such as orange juice, cereal, bread, soy beverages, and tofu products.  Nuts, such as almonds. Follow these recommended amounts for Howell calcium intake:  Children, age 9-3: 700 mg.  Children, age 6-8: 1,000 mg.  Children, age 99-13: 1,300 mg.  Teens, age 75-18: 1,300 mg.  Adults, age 75-50:  1,000 mg.  Adults, age 75-70: ? Men: 1,000 mg. ? Women: 1,200 mg.  Adults, age 75 or older: 1,200 mg.  Pregnant and breastfeeding females: ? Teens: 1,300 mg. ? Adults: 1,000 mg. Get enough vitamin D Vitamin D is the most essential vitamin for bone health. It helps the body absorb calcium. Sunlight stimulates the skin to make vitamin D, so be sure to get enough sunlight. If you live in a cold climate or you do not get outside often, your health care provider may recommend that you take vitamin D supplements. Good sources of vitamin D in your diet include:  Egg yolks.  Saltwater fish.  Milk and cereal fortified with vitamin D. Follow these recommended amounts for Howell vitamin D intake:  Children and teens, age 9-18: 600 international units.  Adults, age 75 or younger: 400-800 international units.  Adults, age 75 or older: 800-1,000 international units. Get other important nutrients Other nutrients that are important for bone health include:  Phosphorus. This mineral is found in meat, poultry, dairy foods, nuts, and legumes. The recommended Howell intake for adult men and adult women is 700 mg.  Magnesium. This mineral is found in seeds, nuts, dark green vegetables, and  legumes. The recommended Howell intake for adult men is 400-420 mg. For adult women, it is 310-320 mg.  Vitamin K. This vitamin is found in green leafy vegetables. The recommended Howell intake is 120 mg for adult men and 90 mg for adult women. What type of physical activity is best for building and maintaining healthy bones? Weight-bearing and strength-building activities are important for building and maintaining healthy bones. Weight-bearing activities cause muscles and bones to work against gravity. Strength-building activities increase the strength of the muscles that support bones. Weight-bearing and muscle-building activities include:  Walking and hiking.  Jogging and running.  Dancing.  Gym exercises.   Lifting weights.  Tennis and racquetball.  Climbing stairs.  Aerobics. Adults should get at least 30 minutes of moderate physical activity on most days. Children should get at least 60 minutes of moderate physical activity on most days. Ask your health care provider what type of exercise is best for you. How can I find out if my bone mass is low? Bone mass can be measured with an X-ray test called a bone mineral density (BMD) test. This test is recommended for all women who are age 75 or older. It may also be recommended for:  Men who are age 570 or older.  People who are at risk for osteoporosis because of: ? Having bones that break easily. ? Having a long-term disease that weakens bones, such as kidney disease or rheumatoid arthritis. ? Having menopause earlier than normal. ? Taking medicine that weakens bones, such as steroids, thyroid hormones, or hormone treatment for breast cancer or prostate cancer. ? Smoking. ? Drinking three or more alcoholic drinks a day. If you find that you have a low bone mass, you may be able to prevent osteoporosis or further bone loss by changing your diet and lifestyle. Where can I find more information? For more information, check out the following websites:  National Osteoporosis Foundation: https://carlson-fletcher.info/www.nof.org/patients  [employer] Institutes of Health: www.bones.http://www.myers.net/nih.gov  International Osteoporosis Foundation: Investment banker, operationalwww.iofbonehealth.org Summary  The aging process leads to an overall loss of bone mass in the body, which can increase the likelihood of broken bones and osteoporosis.  Eating a well-balanced diet with plenty of calcium and vitamin D will help to protect your bones.  Weight-bearing and strength-building activities are also important for building and maintaining strong bones.  Bone mass can be measured with an X-ray test called a bone mineral density (BMD) test. This information is not intended to replace advice given to you by your health care  provider. Make sure you discuss any questions you have with your health care provider. Document Released: 12/09/2003 Document Revised: 10/15/2017 Document Reviewed: 10/15/2017 Elsevier Interactive Patient Education  2019 [employer]    ParklineWanda K. Amariyon Maynes M.D.

## 2019-03-13 DIAGNOSIS — M47816 Spondylosis without myelopathy or radiculopathy, lumbar region: Secondary | ICD-10-CM | POA: Diagnosis not present

## 2019-03-14 DIAGNOSIS — N8111 Cystocele, midline: Secondary | ICD-10-CM | POA: Diagnosis not present

## 2019-03-18 ENCOUNTER — Other Ambulatory Visit: Payer: Self-pay | Admitting: Internal Medicine

## 2019-03-31 DIAGNOSIS — M47816 Spondylosis without myelopathy or radiculopathy, lumbar region: Secondary | ICD-10-CM | POA: Diagnosis not present

## 2019-03-31 DIAGNOSIS — M25552 Pain in left hip: Secondary | ICD-10-CM | POA: Diagnosis not present

## 2019-03-31 DIAGNOSIS — I10 Essential (primary) hypertension: Secondary | ICD-10-CM | POA: Diagnosis not present

## 2019-03-31 DIAGNOSIS — M48061 Spinal stenosis, lumbar region without neurogenic claudication: Secondary | ICD-10-CM | POA: Diagnosis not present

## 2019-04-11 DIAGNOSIS — M1711 Unilateral primary osteoarthritis, right knee: Secondary | ICD-10-CM | POA: Diagnosis not present

## 2019-04-17 ENCOUNTER — Other Ambulatory Visit: Payer: Self-pay | Admitting: Internal Medicine

## 2019-04-22 DIAGNOSIS — M47816 Spondylosis without myelopathy or radiculopathy, lumbar region: Secondary | ICD-10-CM | POA: Diagnosis not present

## 2019-05-17 ENCOUNTER — Other Ambulatory Visit: Payer: Self-pay | Admitting: Internal Medicine

## 2019-06-05 DIAGNOSIS — Z1159 Encounter for screening for other viral diseases: Secondary | ICD-10-CM | POA: Diagnosis not present

## 2019-06-16 ENCOUNTER — Other Ambulatory Visit: Payer: Self-pay | Admitting: Internal Medicine

## 2019-06-27 DIAGNOSIS — M25552 Pain in left hip: Secondary | ICD-10-CM | POA: Diagnosis not present

## 2019-07-04 ENCOUNTER — Other Ambulatory Visit: Payer: Self-pay

## 2019-07-04 NOTE — Patient Outreach (Signed)
[facility]) Care Management  07/04/2019  Elaine Howell 08-Apr-1946 [id number]   Medication Adherence call to Elaine Howell Telephone call to Patient regarding Medication Adherence unable to reach patient. Elaine Howell is showing past due on Rosuvastatin 10 mg and Losartan 100 mg under [place].   [facility] Management Direct Dial [phone]  Fax [phone] [email or web address]

## 2019-07-05 ENCOUNTER — Other Ambulatory Visit: Payer: Self-pay

## 2019-07-05 ENCOUNTER — Ambulatory Visit (INDEPENDENT_AMBULATORY_CARE_PROVIDER_SITE_OTHER): Payer: Medicare Other

## 2019-07-05 DIAGNOSIS — Z23 Encounter for immunization: Secondary | ICD-10-CM | POA: Diagnosis not present

## 2019-07-07 ENCOUNTER — Other Ambulatory Visit: Payer: Self-pay | Admitting: Internal Medicine

## 2019-07-14 NOTE — Progress Notes (Signed)
Chief Complaint  Patient presents with  . Pre-op Exam    to have TKA left    HPI: Elaine Howell 73 y.o. come in for pre op eval for TKA due and lab advised   Left hip pain has progressed and now needs assisted cane  For mobility  to have tka . July 23, 2019 She has controlled hypertension, hypothyroidism, HLD  compliant on medication and life style intervention  ROS: See pertinent positives and negatives per HPI. No cp sob neuro sx falling bleeding sob or respiratory  difficulty at this time  Sister coming o ut of hospital after aphasic episode   Today   Past Medical History:  Diagnosis Date  . Chronic insomnia   . Depression   . Heavy alcohol use 07/27/2010   Qualifier: Diagnosis of  By: Fabian Sharp MD, Neta Mends Daily use     . Hyperlipidemia   . Hypertension   . INSOMNIA UNSPECIFIED 10/29/2007   Qualifier: Diagnosis of  By: Fabian Sharp MD, Neta Mends   . LIVER FUNCTION TESTS, ABNORMAL 12/21/2008   Qualifier: Diagnosis of  By: Fabian Sharp MD, Neta Mends   . Thyroiditis    positive antibodies    Family History  Problem Relation Age of Onset  . Heart disease Mother   . Alcohol abuse Father   . Heart disease Father   . Hypertension Sister     Social History   Socioeconomic History  . Marital status: Married    Spouse name: Not on file  . Number of children: Not on file  . Years of education: Not on file  . Highest education level: Not on file  Occupational History  . Not on file  Social Needs  . Financial resource strain: Not on file  . Food insecurity    Worry: Not on file    Inability: Not on file  . Transportation needs    Medical: Not on file    Non-medical: Not on file  Tobacco Use  . Smoking status: Former Smoker    Start date: 07/24/1964    Quit date: 07/25/1999    Years since quitting: 19.9  . Smokeless tobacco: Never Used  Substance and Sexual Activity  . Alcohol use: Yes    Alcohol/week: 14.0 standard drinks    Types: 14 Glasses of wine per week   Comment: heavy alcohol use  . Drug use: No  . Sexual activity: Not on file  Lifestyle  . Physical activity    Days per week: Not on file    Minutes per session: Not on file  . Stress: Not on file  Relationships  . Social Musician on phone: Not on file    Gets together: Not on file    Attends religious service: Not on file    Active member of club or organization: Not on file    Attends meetings of clubs or organizations: Not on file    Relationship status: Not on file  Other Topics Concern  . Not on file  Social History Narrative      hh of 1   Part time [employer] english as a second language    Now retired    [facility] of 1 no pets   G2P2    Outpatient Medications Prior to Visit  Medication Sig Dispense Refill  . amLODipine (NORVASC) 10 MG tablet TAKE 1 TABLET BY MOUTH DAILY 90 tablet 0  . buPROPion (WELLBUTRIN XL) 300 MG 24 hr tablet TAKE  1 TABLET BY MOUTH EVERY MORNING 30 tablet 0  . DULoxetine (CYMBALTA) 20 MG capsule Take 1 capsule (20 mg total) by mouth daily. Decrease to every other day and wean as tolerated 30 capsule 5  . levothyroxine (SYNTHROID) 88 MCG tablet TAKE 1 TABLET BY MOUTH DAILY 90 tablet 3  . losartan (COZAAR) 100 MG tablet TAKE 1 TABLET BY MOUTH DAILY 90 tablet 0  . rosuvastatin (CRESTOR) 10 MG tablet TAKE 1 TABLET BY MOUTH DAILY 90 tablet 0  . zolpidem (AMBIEN) 5 MG tablet Take 1 tablet (5 mg total) by mouth at bedtime as needed for sleep. For travel . No more than 3 nights in a row 20 tablet 0   No facility-administered medications prior to visit.      EXAM:  BP 136/74 (BP Location: Right Arm, Patient Position: Sitting, Cuff Size: Normal)   Pulse 92   Temp 97.6 F (36.4 C) (Temporal)   Wt 173 lb 6.4 oz (78.7 kg)   SpO2 97%   BMI 31.21 kg/m   Body mass index is 31.21 kg/m.  GENERAL: vitals reviewed and listed above, alert, oriented, appears well hydrated and in no acute distress HEENT: atraumatic, conjunctiva  clear, no obvious  abnormalities on inspection of external nose and ears OP : masked  NECK: no obvious masses on inspection palpation  LUNGS: clear to auscultation bilaterally, no wheezes, rales or rhonchi, good air movement CV: HRRR, no clubbing cyanosis or  peripheral edema nl cap refill  Abdomen:  Sof,t normal bowel sounds without hepatosplenomegaly, no guarding rebound or masses no CVA tenderness MS: moves all extremities without noticeable focal  abnormality limping ;walks with cane   Left hip pain rotation  Skin: normal capillary refill ,turgor , color: No acute rashes ,petechiae or bruising PSYCH: pleasant and cooperative, no obvious depression or anxiety Lab Results  Component Value Date   WBC 4.6 07/15/2019   HGB 12.9 07/15/2019   HCT 39.0 07/15/2019   PLT 462.0 (H) 07/15/2019   GLUCOSE 103 (H) 07/15/2019   CHOL 221 (H) 03/04/2019   TRIG 69.0 03/04/2019   HDL 105.80 03/04/2019   LDLDIRECT 146.3 09/17/2013   LDLCALC 101 (H) 03/04/2019   ALT 31 03/04/2019   AST 24 03/04/2019   NA 140 07/15/2019   K 4.6 07/15/2019   CL 104 07/15/2019   CREATININE 0.67 07/15/2019   BUN 18 07/15/2019   CO2 29 07/15/2019   TSH 1.95 03/04/2019   INR 0.9 07/15/2019   HGBA1C 5.6 07/15/2019   BP Readings from Last 3 Encounters:  07/15/19 136/74  03/11/19 130/72  10/31/18 122/86    ASSESSMENT AND PLAN:  Discussed the following assessment and plan:  Arthritis of left hip - Plan: CBC with Differential/Platelet, Basic metabolic panel, Albumin, Protime-INR, Hemoglobin A1c, POCT Urinalysis Dipstick (Automated)  Pre-op evaluation - medically stable and low risk for surgery  - Plan: CBC with Differential/Platelet, Basic metabolic panel, Albumin, Protime-INR, Hemoglobin A1c, POCT Urinalysis Dipstick (Automated)  Essential hypertension - Plan: CBC with Differential/Platelet, Basic metabolic panel, Albumin, Protime-INR, Hemoglobin A1c, POCT Urinalysis Dipstick (Automated)  Hypothyroidism, unspecified type - Plan:  CBC with Differential/Platelet, Basic metabolic panel, Albumin, Protime-INR, Hemoglobin A1c, POCT Urinalysis Dipstick (Automated)  Medication management - Plan: CBC with Differential/Platelet, Basic metabolic panel, Albumin, Protime-INR, Hemoglobin A1c, POCT Urinalysis Dipstick (Automated) Labs requetsed  Ordered today  No increased risk of surgery.  Medical clearance form to be faxed to Dr Despina HickAlusio office  -Patient advised to return or notify health care team  if  new concerns arise.  Patient Instructions  Lab today as per your  Ortho Will send in medical clearance form    As soon as back   And ok for surgery .  Let us know if we can help in any other ways     Standley Brooking. Franchesca Veneziano M.D.

## 2019-07-15 ENCOUNTER — Encounter: Payer: Self-pay | Admitting: Internal Medicine

## 2019-07-15 ENCOUNTER — Ambulatory Visit: Payer: Medicare Other | Admitting: Internal Medicine

## 2019-07-15 ENCOUNTER — Other Ambulatory Visit: Payer: Self-pay

## 2019-07-15 VITALS — BP 136/74 | HR 92 | Temp 97.6°F | Wt 173.4 lb

## 2019-07-15 DIAGNOSIS — M1612 Unilateral primary osteoarthritis, left hip: Secondary | ICD-10-CM | POA: Diagnosis not present

## 2019-07-15 DIAGNOSIS — Z01818 Encounter for other preprocedural examination: Secondary | ICD-10-CM

## 2019-07-15 DIAGNOSIS — I10 Essential (primary) hypertension: Secondary | ICD-10-CM | POA: Diagnosis not present

## 2019-07-15 DIAGNOSIS — E039 Hypothyroidism, unspecified: Secondary | ICD-10-CM | POA: Diagnosis not present

## 2019-07-15 DIAGNOSIS — Z79899 Other long term (current) drug therapy: Secondary | ICD-10-CM

## 2019-07-15 LAB — POC URINALSYSI DIPSTICK (AUTOMATED)
Glucose, UA: NEGATIVE
Leukocytes, UA: NEGATIVE
Protein, UA: POSITIVE — AB
Spec Grav, UA: 1.02 (ref 1.010–1.025)
Urobilinogen, UA: 0.2 E.U./dL
pH, UA: 6.5 (ref 5.0–8.0)

## 2019-07-15 LAB — BASIC METABOLIC PANEL
BUN: 18 mg/dL (ref 6–23)
CO2: 29 mEq/L (ref 19–32)
Calcium: 10.2 mg/dL (ref 8.4–10.5)
Chloride: 104 mEq/L (ref 96–112)
Creatinine, Ser: 0.67 mg/dL (ref 0.40–1.20)
GFR: 85.65 mL/min (ref >60.00)
Glucose, Bld: 103 mg/dL — ABNORMAL HIGH (ref 70–99)
Potassium: 4.6 mEq/L (ref 3.5–5.1)
Sodium: 140 mEq/L (ref 135–145)

## 2019-07-15 LAB — PROTIME-INR
INR: 0.9 ratio (ref 0.8–1.0)
Prothrombin Time: 11 s (ref 9.6–13.1)

## 2019-07-15 LAB — CBC WITH DIFFERENTIAL/PLATELET
Basophils Absolute: 0 10*3/uL (ref 0.0–0.1)
Basophils Relative: 0.8 % (ref 0.0–3.0)
Eosinophils Absolute: 0.2 10*3/uL (ref 0.0–0.7)
Eosinophils Relative: 4.6 % (ref 0.0–5.0)
HCT: 39 % (ref 36.0–46.0)
Hemoglobin: 12.9 g/dL (ref 12.0–15.0)
Lymphocytes Relative: 31.8 % (ref 12.0–46.0)
Lymphs Abs: 1.5 10*3/uL (ref 0.7–4.0)
MCHC: 33.1 g/dL (ref 30.0–36.0)
MCV: 94.6 fl (ref 78.0–100.0)
Monocytes Absolute: 0.4 10*3/uL (ref 0.1–1.0)
Monocytes Relative: 9.3 % (ref 3.0–12.0)
Neutro Abs: 2.5 10*3/uL (ref 1.4–7.7)
Neutrophils Relative %: 53.5 % (ref 43.0–77.0)
Platelets: 462 10*3/uL — ABNORMAL HIGH (ref 150.0–400.0)
RBC: 4.12 Mil/uL (ref 3.87–5.11)
RDW: 13.7 % (ref 11.5–15.5)
WBC: 4.6 10*3/uL (ref 4.0–10.5)

## 2019-07-15 LAB — HEMOGLOBIN A1C: Hgb A1c MFr Bld: 5.6 % (ref 4.6–6.5)

## 2019-07-15 LAB — ALBUMIN: Albumin: 4.6 g/dL (ref 3.5–5.2)

## 2019-07-15 NOTE — Patient Instructions (Signed)
Lab today as per your  Ortho Will send in medical clearance form    As soon as back   And ok for surgery .  Let us know if we can help in any other ways

## 2019-07-16 NOTE — Patient Instructions (Addendum)
DUE TO COVID-19 ONLY ONE VISITOR IS ALLOWED TO COME WITH YOU AND STAY IN THE WAITING ROOM ONLY DURING PRE OP AND PROCEDURE DAY OF SURGERY. THE 1 VISITOR MAY VISIT WITH YOU AFTER SURGERY IN YOUR PRIVATE ROOM DURING VISITING HOURS ONLY!  YOU NEED TO HAVE A COVID 19 TEST ON ___Saturday 10-17____ :00AM____, THIS TEST MUST BE DONE BEFORE SURGERY, COME  [address], [place] , [address].  [facility]) ONCE YOUR COVID TEST IS COMPLETED, PLEASE BEGIN THE QUARANTINE INSTRUCTIONS AS OUTLINED IN YOUR HANDOUT.                Elaine Howell    Your procedure is scheduled on: 07-23-19    Report to [facility] Main  Entrance    Report to Admitting at 2:00 PM     Call this number if you have problems the morning of surgery [phone]    Remember: Do not eat food After Midnight. YOU MAY HAVE CLEAR LIQUIDS FROM MIDNIGHT UNTIL 1:30 PM. At 1:30 PM Please finish the prescribed Pre-Surgery ENSURE drink. Nothing by mouth after you finish the ENSURE drink !   CLEAR LIQUID DIET   Foods Allowed                                                                     Foods Excluded  Coffee and tea, regular and decaf                             liquids that you cannot  Plain Jell-O any favor except red or purple                                           see through such as: Fruit ices (not with fruit pulp)                                     milk, soups, orange juice  Iced Popsicles                                    All solid food Carbonated beverages, regular and diet                                    Cranberry, grape and apple juices Sports drinks like Gatorade Lightly seasoned clear broth or consume(fat free) Sugar, honey syrup  Sample Menu Breakfast                                Lunch                                     Supper Cranberry juice  Beef broth                            Chicken broth Jell-O                                      Grape juice                           Apple juice Coffee or tea                        Jell-O                                      Popsicle                                                Coffee or tea                        Coffee or tea  _____________________________________________________________________       Take these medicines the morning of surgery with A SIP OF WATER: Amlodipine (Norvasc), Bupropion (Wellbutrin), Duloxetine (Cymbalta), and Levothyroxine (Synthroid)   BRUSH YOUR TEETH MORNING OF SURGERY AND RINSE YOUR MOUTH OUT, NO CHEWING GUM CANDY OR MINTS.                                 You may not have any metal on your body including hair pins and              piercings     Do not wear jewelry, make-up, lotions, powders or perfumes, deodorant              Do not wear nail polish on your fingernails.  Do not shave  48 hours prior to surgery.               Do not bring valuables to the hospital. [facility] IS NOT             RESPONSIBLE   FOR VALUABLES.  Contacts, dentures or bridgework may not be worn into surgery.  YOU MAY BRING A SMALL OVERNIGHT BAG              Please read over the following fact sheets you were given: _____________________________________________________________________             [facility] - Preparing for Surgery Before surgery, you can play an important role.  Because skin is not sterile, your skin needs to be as free of germs as possible.  You can reduce the number of germs on your skin by washing with CHG (chlorahexidine gluconate) soap before surgery.  CHG is an antiseptic cleaner which kills germs and bonds with the skin to continue killing germs even after washing. Please DO NOT use if you have an allergy to CHG or antibacterial soaps.  If your skin becomes reddened/irritated stop using the CHG and inform your nurse when you arrive at Short Stay. Do not shave (including legs  and underarms) for at least 48 hours prior to the first CHG  shower.  You may shave your face/neck. Please follow these instructions carefully:  1.  Shower with CHG Soap the night before surgery and the  morning of Surgery.  2.  If you choose to wash your hair, wash your hair first as usual with your  normal  shampoo.  3.  After you shampoo, rinse your hair and body thoroughly to remove the  shampoo.                           4.  Use CHG as you would any other liquid soap.  You can apply chg directly  to the skin and wash                       Gently with a scrungie or clean washcloth.  5.  Apply the CHG Soap to your body ONLY FROM THE NECK DOWN.   Do not use on face/ open                           Wound or open sores. Avoid contact with eyes, ears mouth and genitals (private parts).                       Wash face,  Genitals (private parts) with your normal soap.             6.  Wash thoroughly, paying special attention to the area where your surgery  will be performed.  7.  Thoroughly rinse your body with warm water from the neck down.  8.  DO NOT shower/wash with your normal soap after using and rinsing off  the CHG Soap.                9.  Pat yourself dry with a clean towel.            10.  Wear clean pajamas.            11.  Place clean sheets on your bed the night of your first shower and do not  sleep with pets. Day of Surgery : Do not apply any lotions/deodorants the morning of surgery.  Please wear clean clothes to the hospital/surgery center.  FAILURE TO FOLLOW THESE INSTRUCTIONS MAY RESULT IN THE CANCELLATION OF YOUR SURGERY PATIENT SIGNATURE_________________________________  NURSE SIGNATURE__________________________________  ________________________________________________________________________   Elaine Howell  An incentive spirometer is a tool that can help keep your lungs clear and active. This tool measures how well you are filling your lungs with each breath. Taking long deep breaths may help reverse or decrease the chance  of developing breathing (pulmonary) problems (especially infection) following:  A long period of time when you are unable to move or be active. BEFORE THE PROCEDURE   If the spirometer includes an indicator to show your best effort, your nurse or respiratory therapist will set it to a desired goal.  If possible, sit up straight or lean slightly forward. Try not to slouch.  Hold the incentive spirometer in an upright position. INSTRUCTIONS FOR USE  1. Sit on the edge of your bed if possible, or sit up as far as you can in bed or on a chair. 2. Hold the incentive spirometer in an upright position. 3. Breathe out normally. 4. Place the mouthpiece in your mouth and seal  your lips tightly around it. 5. Breathe in slowly and as deeply as possible, raising the piston or the ball toward the top of the column. 6. Hold your breath for 3-5 seconds or for as long as possible. Allow the piston or ball to fall to the bottom of the column. 7. Remove the mouthpiece from your mouth and breathe out normally. 8. Rest for a few seconds and repeat Steps 1 through 7 at least 10 times every 1-2 hours when you are awake. Take your time and take a few normal breaths between deep breaths. 9. The spirometer may include an indicator to show your best effort. Use the indicator as a goal to work toward during each repetition. 10. After each set of 10 deep breaths, practice coughing to be sure your lungs are clear. If you have an incision (the cut made at the time of surgery), support your incision when coughing by placing a pillow or rolled up towels firmly against it. Once you are able to get out of bed, walk around indoors and cough well. You may stop using the incentive spirometer when instructed by your caregiver.  RISKS AND COMPLICATIONS  Take your time so you do not get dizzy or light-headed.  If you are in pain, you may need to take or ask for pain medication before doing incentive spirometry. It is harder to  take a deep breath if you are having pain. AFTER USE  Rest and breathe slowly and easily.  It can be helpful to keep track of a log of your progress. Your caregiver can provide you with a simple table to help with this. If you are using the spirometer at home, follow these instructions: [place] IF:   You are having difficultly using the spirometer.  You have trouble using the spirometer as often as instructed.  Your pain medication is not giving enough relief while using the spirometer.  You develop fever of 100.5 F (38.1 C) or higher. SEEK IMMEDIATE MEDICAL CARE IF:   You cough up bloody sputum that had not been present before.  You develop fever of 102 F (38.9 C) or greater.  You develop worsening pain at or near the incision site. MAKE SURE YOU:   Understand these instructions.  Will watch your condition.  Will get help right away if you are not doing well or get worse. Document Released: 01/29/2007 Document Revised: 12/11/2011 Document Reviewed: 04/01/2007 ExitCare Patient Information 2014 ExitCare, [place].   ________________________________________________________________________  WHAT IS A BLOOD TRANSFUSION? Blood Transfusion Information  A transfusion is the replacement of blood or some of its parts. Blood is made up of multiple cells which provide different functions.  Red blood cells carry oxygen and are used for blood loss replacement.  White blood cells fight against infection.  Platelets control bleeding.  Plasma helps clot blood.  Other blood products are available for specialized needs, such as hemophilia or other clotting disorders. BEFORE THE TRANSFUSION  Who gives blood for transfusions?   Healthy volunteers who are fully evaluated to make sure their blood is safe. This is blood bank blood. Transfusion therapy is the safest it has ever been in the practice of medicine. Before blood is taken from a donor, a complete history is taken to  make sure that person has no history of diseases nor engages in risky social behavior (examples are intravenous drug use or sexual activity with multiple partners). The donor's travel history is screened to minimize risk of transmitting infections, such  as malaria. The donated blood is tested for signs of infectious diseases, such as HIV and hepatitis. The blood is then tested to be sure it is compatible with you in order to minimize the chance of a transfusion reaction. If you or a relative donates blood, this is often done in anticipation of surgery and is not appropriate for emergency situations. It takes many days to process the donated blood. RISKS AND COMPLICATIONS Although transfusion therapy is very safe and saves many lives, the main dangers of transfusion include:   Getting an infectious disease.  Developing a transfusion reaction. This is an allergic reaction to something in the blood you were given. Every precaution is taken to prevent this. The decision to have a blood transfusion has been considered carefully by your caregiver before blood is given. Blood is not given unless the benefits outweigh the risks. AFTER THE TRANSFUSION  Right after receiving a blood transfusion, you will usually feel much better and more energetic. This is especially true if your red blood cells have gotten low (anemic). The transfusion raises the level of the red blood cells which carry oxygen, and this usually causes an energy increase.  The nurse administering the transfusion will monitor you carefully for complications. HOME CARE INSTRUCTIONS  No special instructions are needed after a transfusion. You may find your energy is better. Speak with your caregiver about any limitations on activity for underlying diseases you may have. SEEK MEDICAL CARE IF:   Your condition is not improving after your transfusion.  You develop redness or irritation at the intravenous (IV) site. SEEK IMMEDIATE MEDICAL CARE  IF:  Any of the following symptoms occur over the next 12 hours:  Shaking chills.  You have a temperature by mouth above 102 F (38.9 C), not controlled by medicine.  Chest, back, or muscle pain.  People around you feel you are not acting correctly or are confused.  Shortness of breath or difficulty breathing.  Dizziness and fainting.  You get a rash or develop hives.  You have a decrease in urine output.  Your urine turns a dark color or changes to pink, red, or brown. Any of the following symptoms occur over the next 10 days:  You have a temperature by mouth above 102 F (38.9 C), not controlled by medicine.  Shortness of breath.  Weakness after normal activity.  The white part of the eye turns yellow (jaundice).  You have a decrease in the amount of urine or are urinating less often.  Your urine turns a dark color or changes to pink, red, or brown. Document Released: 09/15/2000 Document Revised: 12/11/2011 Document Reviewed: 05/04/2008 [facility] Patient Information 2014 [place], [place].  _______________________________________________________________________

## 2019-07-17 ENCOUNTER — Other Ambulatory Visit: Payer: Self-pay

## 2019-07-17 ENCOUNTER — Encounter (HOSPITAL_COMMUNITY)
Admission: RE | Admit: 2019-07-17 | Discharge: 2019-07-17 | Disposition: A | Discharge status: Home / Self Care | Payer: Medicare Other | Source: Ambulatory Visit | Attending: Orthopedic Surgery | Admitting: Orthopedic Surgery

## 2019-07-17 ENCOUNTER — Encounter (HOSPITAL_COMMUNITY): Payer: Self-pay

## 2019-07-17 DIAGNOSIS — Z79899 Other long term (current) drug therapy: Secondary | ICD-10-CM | POA: Insufficient documentation

## 2019-07-17 DIAGNOSIS — Z01818 Encounter for other preprocedural examination: Secondary | ICD-10-CM | POA: Insufficient documentation

## 2019-07-17 DIAGNOSIS — Z79890 Hormone replacement therapy (postmenopausal): Secondary | ICD-10-CM | POA: Diagnosis not present

## 2019-07-17 DIAGNOSIS — E785 Hyperlipidemia, unspecified: Secondary | ICD-10-CM | POA: Insufficient documentation

## 2019-07-17 DIAGNOSIS — I10 Essential (primary) hypertension: Secondary | ICD-10-CM | POA: Diagnosis not present

## 2019-07-17 DIAGNOSIS — F329 Major depressive disorder, single episode, unspecified: Secondary | ICD-10-CM | POA: Insufficient documentation

## 2019-07-17 DIAGNOSIS — Z87891 Personal history of nicotine dependence: Secondary | ICD-10-CM | POA: Insufficient documentation

## 2019-07-17 DIAGNOSIS — E039 Hypothyroidism, unspecified: Secondary | ICD-10-CM | POA: Diagnosis not present

## 2019-07-17 DIAGNOSIS — R3989 Other symptoms and signs involving the genitourinary system: Secondary | ICD-10-CM

## 2019-07-17 DIAGNOSIS — M1612 Unilateral primary osteoarthritis, left hip: Secondary | ICD-10-CM | POA: Insufficient documentation

## 2019-07-17 LAB — PROTIME-INR
INR: 0.8 (ref 0.8–1.2)
Prothrombin Time: 11.4 seconds (ref 11.4–15.2)

## 2019-07-17 LAB — COMPREHENSIVE METABOLIC PANEL
ALT: 27 U/L (ref 0–44)
AST: 21 U/L (ref 15–41)
Albumin: 4.6 g/dL (ref 3.5–5.0)
Alkaline Phosphatase: 97 U/L (ref 38–126)
Anion gap: 12 (ref 5–15)
BUN: 18 mg/dL (ref 8–23)
CO2: 24 mmol/L (ref 22–32)
Calcium: 10.1 mg/dL (ref 8.9–10.3)
Chloride: 104 mmol/L (ref 98–111)
Creatinine, Ser: 0.63 mg/dL (ref 0.44–1.00)
GFR calc Af Amer: >60 mL/min (ref >60)
GFR calc non Af Amer: >60 mL/min (ref >60)
Glucose, Bld: 96 mg/dL (ref 70–99)
Potassium: 3.9 mmol/L (ref 3.5–5.1)
Sodium: 140 mmol/L (ref 135–145)
Total Bilirubin: 0.7 mg/dL (ref 0.3–1.2)
Total Protein: 7.6 g/dL (ref 6.5–8.1)

## 2019-07-17 LAB — SURGICAL PCR SCREEN
MRSA, PCR: NEGATIVE
Staphylococcus aureus: NEGATIVE

## 2019-07-17 LAB — APTT: aPTT: 37 seconds — ABNORMAL HIGH (ref 24–36)

## 2019-07-17 NOTE — Progress Notes (Signed)
PCP - Panosh, Standley Brooking, MD Cardiologist -    Chest x-ray -  EKG - done today  Stress Test -  ECHO -  Cardiac Cath -   Sleep Study -  CPAP -   Fasting Blood Sugar -  Checks Blood Sugar _____ times a day  Blood Thinner Instructions: Aspirin Instructions: Last Dose:  Anesthesia review:   Surgical clearance Dr Regis Bill on chart   Patient denies shortness of breath, fever, cough and chest pain at PAT appointment   Patient verbalized understanding of instructions that were given to them at the PAT appointment. Patient was also instructed that they will need to review over the PAT instructions again at home before surgery.

## 2019-07-18 ENCOUNTER — Ambulatory Visit: Payer: Self-pay | Admitting: *Deleted

## 2019-07-18 ENCOUNTER — Telehealth: Payer: Self-pay | Admitting: Internal Medicine

## 2019-07-18 LAB — ABO/RH: ABO/RH(D): O POS

## 2019-07-18 NOTE — Telephone Encounter (Signed)
The patient dropped of a Physical/Mental Accommodations Assessment  Fax to: [phone]  Patient would like to be contacted through [place] just to let him know when the paperwork has been completed and faxed.  Disposition: Dr's Folder

## 2019-07-18 NOTE — Telephone Encounter (Signed)
Disregard message the message about the form  The patient has a question about something needing to be removed from her bladder before her hip surgery.  I told her that she can ask you that when you call her with the results from her UA.

## 2019-07-18 NOTE — Telephone Encounter (Signed)
I suggested the same thing, she was still wanting me to ask Panosh if it would hurt to leave it in or if she needing to have it removed before surgery.

## 2019-07-18 NOTE — Telephone Encounter (Signed)
Spoke with patient and told her she needs to contact obgyn, her surgeon, or urologist about this question if it needs to be removed

## 2019-07-18 NOTE — Telephone Encounter (Signed)
Pt called in and was given message from Dr. Regis Bill dated 10/14/202 at 9:20 AM.  She has surgery coming up and she forgot to mention she has a pessary in that she cannot remove herself.   Is this something Dr. Regis Bill can do?    Her GYN inserted it and she forgot to mention it during her surgical work up.  I warm transferred the call into Dr. Velora Mediate office.

## 2019-07-19 ENCOUNTER — Other Ambulatory Visit (HOSPITAL_COMMUNITY)
Admission: RE | Admit: 2019-07-19 | Discharge: 2019-07-19 | Disposition: A | Discharge status: Home / Self Care | Payer: Medicare Other | Source: Ambulatory Visit | Attending: Orthopedic Surgery | Admitting: Orthopedic Surgery

## 2019-07-19 DIAGNOSIS — Z20828 Contact with and (suspected) exposure to other viral communicable diseases: Secondary | ICD-10-CM | POA: Diagnosis not present

## 2019-07-19 DIAGNOSIS — Z01812 Encounter for preprocedural laboratory examination: Secondary | ICD-10-CM | POA: Diagnosis not present

## 2019-07-20 LAB — NOVEL CORONAVIRUS, NAA (HOSP ORDER, SEND-OUT TO REF LAB; TAT 18-24 HRS): SARS-CoV-2, NAA: NOT DETECTED

## 2019-07-21 ENCOUNTER — Other Ambulatory Visit: Payer: Self-pay

## 2019-07-21 ENCOUNTER — Other Ambulatory Visit: Payer: Medicare Other

## 2019-07-21 ENCOUNTER — Telehealth: Payer: Self-pay | Admitting: *Deleted

## 2019-07-21 DIAGNOSIS — R3989 Other symptoms and signs involving the genitourinary system: Secondary | ICD-10-CM

## 2019-07-21 NOTE — H&P (Signed)
TOTAL HIP ADMISSION H&P  Patient is admitted for left total hip arthroplasty.  Subjective:  Chief Complaint: left hip pain  HPI: Elaine Howell, 73 y.o. female, has a history of pain and functional disability in the left hip(s) due to arthritis and patient has failed non-surgical conservative treatments for greater than 12 weeks to include NSAID's and/or analgesics, corticosteriod injections, flexibility and strengthening excercises, use of assistive devices and activity modification.  Onset of symptoms was gradual starting 2 years ago with gradually worsening course since that time.The patient noted no past surgery on the left hip(s).  Patient currently rates pain in the left hip at 8 out of 10 with activity. Patient has night pain, worsening of pain with activity and weight bearing, pain that interfers with activities of daily living and pain with passive range of motion. Patient has evidence of subchondral cysts, subchondral sclerosis, periarticular osteophytes and joint space narrowing by imaging studies. This condition presents safety issues increasing the risk of falls. There is no current active infection.  Patient Active Problem List   Diagnosis Date Noted  . Wears hearing aid 10/20/2014  . Prolapse of female pelvic organs 07/24/2012  . Medicare annual wellness visit, subsequent 07/24/2012  . Leukopenia 07/10/2011  . Hypothyroidism 07/27/2010  . CONSTIPATION 01/25/2010  . BUNIONS, RIGHT FOOT 11/16/2009  . Essential hypertension 12/17/2007  . UNSPECIFIED VITAMIN D DEFICIENCY 10/29/2007  . THYROIDITIS NOS 02/12/2007  . HLD (hyperlipidemia) 02/12/2007  . Depression 02/12/2007   Past Medical History:  Diagnosis Date  . Chronic insomnia   . Depression   . Heavy alcohol use 07/27/2010   Qualifier: Diagnosis of  By: Fabian Sharp MD, Neta Mends Daily use     . Hyperlipidemia   . Hypertension   . INSOMNIA UNSPECIFIED 10/29/2007   Qualifier: Diagnosis of  By: Fabian Sharp MD, Neta Mends   .  LIVER FUNCTION TESTS, ABNORMAL 12/21/2008   Qualifier: Diagnosis of  By: Fabian Sharp MD, Neta Mends   . Thyroiditis    positive antibodies    Past Surgical History:  Procedure Laterality Date  . CATARACT EXTRACTION, BILATERAL  2015  . COLONOSCOPY    . WISDOM TOOTH EXTRACTION         Current Outpatient Medications  Medication Sig Dispense Refill Last Dose  . amLODipine (NORVASC) 10 MG tablet TAKE 1 TABLET BY MOUTH DAILY 90 tablet 0   . buPROPion (WELLBUTRIN XL) 300 MG 24 hr tablet TAKE 1 TABLET BY MOUTH EVERY MORNING 30 tablet 0   . DULoxetine (CYMBALTA) 20 MG capsule Take 1 capsule (20 mg total) by mouth daily. Decrease to every other day and wean as tolerated 30 capsule 5   . levothyroxine (SYNTHROID) 88 MCG tablet TAKE 1 TABLET BY MOUTH DAILY 90 tablet 3   . losartan (COZAAR) 100 MG tablet TAKE 1 TABLET BY MOUTH DAILY 90 tablet 0   . rosuvastatin (CRESTOR) 10 MG tablet TAKE 1 TABLET BY MOUTH DAILY 90 tablet 0    Allergies  Allergen Reactions  . Lisinopril Cough    Social History   Tobacco Use  . Smoking status: Former Smoker    Start date: 07/24/1964    Quit date: 07/25/1999    Years since quitting: 20.0  . Smokeless tobacco: Never Used  Substance Use Topics  . Alcohol use: Yes    Alcohol/week: 14.0 standard drinks    Types: 14 Glasses of wine per week    Comment: heavy alcohol use,  "its been years since ive gone a day  without alcholol but i didnt feel sick when i wasnt drinking"     Family History  Problem Relation Age of Onset  . Heart disease Mother   . Alcohol abuse Father   . Heart disease Father   . Hypertension Sister      Review of Systems  Constitutional: Negative.   HENT: Negative.   Eyes: Negative.   Respiratory: Negative.   Cardiovascular: Negative.   Gastrointestinal: Negative.   Genitourinary: Positive for frequency. Negative for dysuria, flank pain, hematuria and urgency.       Positive for incontinence  Musculoskeletal: Positive for back pain, joint  pain and myalgias. Negative for falls and neck pain.  Skin: Negative.   Neurological: Negative.   Endo/Heme/Allergies: Negative.   Psychiatric/Behavioral: Positive for depression. Negative for hallucinations, memory loss, substance abuse and suicidal ideas. The patient is nervous/anxious and has insomnia.     Objective:  Physical Exam  Constitutional: She is oriented to person, place, and time. She appears well-developed. No distress.  Overweight  HENT:  Head: Normocephalic and atraumatic.  Right Ear: External ear normal.  Left Ear: External ear normal.  Nose: Nose normal.  Mouth/Throat: Oropharynx is clear and moist.  Eyes: Conjunctivae and EOM are normal.  Neck: Normal range of motion. Neck supple.  Cardiovascular: Normal rate, regular rhythm, normal heart sounds and intact distal pulses.  No murmur heard. Respiratory: Effort normal and breath sounds normal. No respiratory distress. She has no wheezes.  GI: Soft. Bowel sounds are normal. She exhibits no distension. There is no abdominal tenderness.  Musculoskeletal:     Comments: Significantly antalgic gait pattern favoring the left side without the use of assisted devices.  Left Hip Exam: ROM: Flexion to 100, Internal Rotation 0, External Rotation 20, and Abduction 20 with significant pain. There is no tenderness over the greater trochanter bursa  Neurological: She is alert and oriented to person, place, and time. She has normal strength. No sensory deficit.  Skin: No rash noted. She is not diaphoretic. No erythema.    Vital Signs Ht: 5 ft 3 in  Wt: 170 lbs  BMI: 30.1 BP: 122/74 sitting R arm  Pulse: 84 bpm  Imaging Review Plain radiographs demonstrate severe degenerative joint disease of the left hip(s). The bone quality appears to be good for age and reported activity level.    Assessment/Plan:  End stage primary osteoarthritis, left hip(s)  The patient history, physical examination, clinical judgement of the  provider and imaging studies are consistent with end stage degenerative joint disease of the left hip(s) and total hip arthroplasty is deemed medically necessary. The treatment options including medical management, injection therapy, arthroscopy and arthroplasty were discussed at length. The risks and benefits of total hip arthroplasty were presented and reviewed. The risks due to aseptic loosening, infection, stiffness, dislocation/subluxation,  thromboembolic complications and other imponderables were discussed.  The patient acknowledged the explanation, agreed to proceed with the plan and consent was signed. Patient is being admitted for inpatient treatment for surgery, pain control, PT, OT, prophylactic antibiotics, VTE prophylaxis, progressive ambulation and ADL's and discharge planning.The patient is planning to be discharged home.   Risks and benefits of the surgery were discussed with the patient and Dr.Aluisio at their previous office visit, and the patient has elected to move forward with the aforementioned surgery. Post-operative care plans were discussed with the patient today.  Therapy Plans: HEP Disposition: Home with son Planned DVT prophylaxis: aspirin 325mg  BID DME needed: rolling walker; 3-n-1 PCP: Dr. Burna MortimerWanda  Panosh Other: no anesthesia concerns  Instructed patient on which meds to stop prior to surgery  Ardeen Jourdain, PA-C

## 2019-07-21 NOTE — Telephone Encounter (Signed)
Pt return call for lab results. Lab message given to her. She stated that she had already sent in another urine specimen and she was hoping to get those results. She is having surgery on Wednesday. She is advised to check back tomorrow. Routing to the practice for review

## 2019-07-23 ENCOUNTER — Inpatient Hospital Stay (HOSPITAL_COMMUNITY)
Admission: RE | Admit: 2019-07-23 | Discharge: 2019-07-25 | DRG: 470 | Disposition: A | Discharge status: Home Health | Payer: Medicare Other | Attending: Orthopedic Surgery | Admitting: Orthopedic Surgery

## 2019-07-23 ENCOUNTER — Inpatient Hospital Stay (HOSPITAL_COMMUNITY): Payer: Medicare Other

## 2019-07-23 ENCOUNTER — Inpatient Hospital Stay (HOSPITAL_COMMUNITY): Payer: Medicare Other | Admitting: Physician Assistant

## 2019-07-23 ENCOUNTER — Other Ambulatory Visit: Payer: Self-pay

## 2019-07-23 ENCOUNTER — Inpatient Hospital Stay (HOSPITAL_COMMUNITY): Payer: Medicare Other | Admitting: Certified Registered Nurse Anesthetist

## 2019-07-23 ENCOUNTER — Encounter (HOSPITAL_COMMUNITY): Payer: Self-pay | Admitting: *Deleted

## 2019-07-23 ENCOUNTER — Encounter (HOSPITAL_COMMUNITY)
Admission: RE | Disposition: A | Discharge status: Home Health | Payer: Self-pay | Source: Home / Self Care | Attending: Orthopedic Surgery

## 2019-07-23 DIAGNOSIS — Z811 Family history of alcohol abuse and dependence: Secondary | ICD-10-CM | POA: Diagnosis not present

## 2019-07-23 DIAGNOSIS — Z87891 Personal history of nicotine dependence: Secondary | ICD-10-CM | POA: Diagnosis not present

## 2019-07-23 DIAGNOSIS — Z889 Allergy status to unspecified drugs, medicaments and biological substances status: Secondary | ICD-10-CM

## 2019-07-23 DIAGNOSIS — Z974 Presence of external hearing-aid: Secondary | ICD-10-CM

## 2019-07-23 DIAGNOSIS — M25752 Osteophyte, left hip: Secondary | ICD-10-CM | POA: Diagnosis not present

## 2019-07-23 DIAGNOSIS — Z20828 Contact with and (suspected) exposure to other viral communicable diseases: Secondary | ICD-10-CM | POA: Diagnosis present

## 2019-07-23 DIAGNOSIS — Z9841 Cataract extraction status, right eye: Secondary | ICD-10-CM

## 2019-07-23 DIAGNOSIS — M25552 Pain in left hip: Secondary | ICD-10-CM | POA: Diagnosis not present

## 2019-07-23 DIAGNOSIS — I10 Essential (primary) hypertension: Secondary | ICD-10-CM | POA: Diagnosis not present

## 2019-07-23 DIAGNOSIS — F5104 Psychophysiologic insomnia: Secondary | ICD-10-CM | POA: Diagnosis not present

## 2019-07-23 DIAGNOSIS — E039 Hypothyroidism, unspecified: Secondary | ICD-10-CM | POA: Diagnosis present

## 2019-07-23 DIAGNOSIS — Z79890 Hormone replacement therapy (postmenopausal): Secondary | ICD-10-CM

## 2019-07-23 DIAGNOSIS — Z79899 Other long term (current) drug therapy: Secondary | ICD-10-CM | POA: Diagnosis not present

## 2019-07-23 DIAGNOSIS — M169 Osteoarthritis of hip, unspecified: Secondary | ICD-10-CM | POA: Diagnosis present

## 2019-07-23 DIAGNOSIS — E785 Hyperlipidemia, unspecified: Secondary | ICD-10-CM | POA: Diagnosis not present

## 2019-07-23 DIAGNOSIS — Z9842 Cataract extraction status, left eye: Secondary | ICD-10-CM | POA: Diagnosis not present

## 2019-07-23 DIAGNOSIS — Z8249 Family history of ischemic heart disease and other diseases of the circulatory system: Secondary | ICD-10-CM | POA: Diagnosis not present

## 2019-07-23 DIAGNOSIS — F329 Major depressive disorder, single episode, unspecified: Secondary | ICD-10-CM | POA: Diagnosis not present

## 2019-07-23 DIAGNOSIS — M1611 Unilateral primary osteoarthritis, right hip: Secondary | ICD-10-CM | POA: Diagnosis not present

## 2019-07-23 DIAGNOSIS — Z471 Aftercare following joint replacement surgery: Secondary | ICD-10-CM | POA: Diagnosis not present

## 2019-07-23 DIAGNOSIS — M1612 Unilateral primary osteoarthritis, left hip: Principal | ICD-10-CM | POA: Diagnosis present

## 2019-07-23 DIAGNOSIS — Z96649 Presence of unspecified artificial hip joint: Secondary | ICD-10-CM

## 2019-07-23 DIAGNOSIS — Z419 Encounter for procedure for purposes other than remedying health state, unspecified: Secondary | ICD-10-CM

## 2019-07-23 DIAGNOSIS — Z96642 Presence of left artificial hip joint: Secondary | ICD-10-CM | POA: Diagnosis not present

## 2019-07-23 HISTORY — PX: TOTAL HIP ARTHROPLASTY: SHX124

## 2019-07-23 LAB — TYPE AND SCREEN
ABO/RH(D): O POS
Antibody Screen: NEGATIVE

## 2019-07-23 LAB — URINE CULTURE
MICRO NUMBER:: 1004179
SPECIMEN QUALITY:: ADEQUATE

## 2019-07-23 SURGERY — ARTHROPLASTY, HIP, TOTAL, ANTERIOR APPROACH
Anesthesia: Spinal | Site: Hip | Laterality: Left

## 2019-07-23 MED ORDER — CHLORHEXIDINE GLUCONATE 4 % EX LIQD: 60.0000 mL | Freq: Once | CUTANEOUS | Status: DC

## 2019-07-23 MED ORDER — FENTANYL CITRATE (PF) 100 MCG/2ML IJ SOLN
INTRAMUSCULAR | Status: DC | PRN
  Administered 2019-07-23: 25 ug via INTRAVENOUS
  Administered 2019-07-23: 50 ug via INTRAVENOUS
  Administered 2019-07-23: 25 ug via INTRAVENOUS

## 2019-07-23 MED ORDER — ONDANSETRON HCL 4 MG/2ML IJ SOLN: 4.0000 mg | Freq: Once | INTRAMUSCULAR | Status: DC | PRN

## 2019-07-23 MED ORDER — PROPOFOL 500 MG/50ML IV EMUL
INTRAVENOUS | Status: AC
  Filled 2019-07-23: qty 50

## 2019-07-23 MED ORDER — PROPOFOL 10 MG/ML IV BOLUS
INTRAVENOUS | Status: AC
  Filled 2019-07-23: qty 20

## 2019-07-23 MED ORDER — DULOXETINE HCL 20 MG PO CPEP
20.0000 mg | ORAL_CAPSULE | Freq: Every day | ORAL | Status: DC
  Administered 2019-07-24 – 2019-07-25 (×2): 20 mg via ORAL
  Filled 2019-07-23 (×2): qty 1

## 2019-07-23 MED ORDER — HYDROMORPHONE HCL 1 MG/ML IJ SOLN
0.2500 mg | INTRAMUSCULAR | Status: DC | PRN
  Administered 2019-07-23 (×2): 0.5 mg via INTRAVENOUS

## 2019-07-23 MED ORDER — BUPIVACAINE HCL 0.25 % IJ SOLN
INTRAMUSCULAR | Status: DC | PRN
  Administered 2019-07-23: 30 mL

## 2019-07-23 MED ORDER — TRANEXAMIC ACID-NACL 1000-0.7 MG/100ML-% IV SOLN
INTRAVENOUS | Status: AC
  Filled 2019-07-23: qty 100

## 2019-07-23 MED ORDER — ONDANSETRON HCL 4 MG PO TABS: 4.0000 mg | ORAL_TABLET | Freq: Four times a day (QID) | ORAL | Status: DC | PRN

## 2019-07-23 MED ORDER — MENTHOL 3 MG MT LOZG: 1.0000 | LOZENGE | OROMUCOSAL | Status: DC | PRN

## 2019-07-23 MED ORDER — MORPHINE SULFATE (PF) 2 MG/ML IV SOLN
0.5000 mg | INTRAVENOUS | Status: DC | PRN
  Administered 2019-07-23: 1 mg via INTRAVENOUS
  Filled 2019-07-23: qty 1

## 2019-07-23 MED ORDER — AMLODIPINE BESYLATE 10 MG PO TABS
10.0000 mg | ORAL_TABLET | Freq: Every day | ORAL | Status: DC
  Administered 2019-07-24 – 2019-07-25 (×2): 10 mg via ORAL
  Filled 2019-07-23 (×2): qty 1

## 2019-07-23 MED ORDER — PROPOFOL 10 MG/ML IV BOLUS
INTRAVENOUS | Status: DC | PRN
  Administered 2019-07-23 (×4): 20 mg via INTRAVENOUS

## 2019-07-23 MED ORDER — ACETAMINOPHEN 10 MG/ML IV SOLN
1000.0000 mg | Freq: Four times a day (QID) | INTRAVENOUS | Status: DC
  Administered 2019-07-23: 1000 mg via INTRAVENOUS
  Filled 2019-07-23: qty 100

## 2019-07-23 MED ORDER — MEPERIDINE HCL 50 MG/ML IJ SOLN: 6.2500 mg | INTRAMUSCULAR | Status: DC | PRN

## 2019-07-23 MED ORDER — LOSARTAN POTASSIUM 50 MG PO TABS
100.0000 mg | ORAL_TABLET | Freq: Every day | ORAL | Status: DC
  Administered 2019-07-24 – 2019-07-25 (×2): 100 mg via ORAL
  Filled 2019-07-23 (×2): qty 2

## 2019-07-23 MED ORDER — HYDROMORPHONE HCL 1 MG/ML IJ SOLN
INTRAMUSCULAR | Status: AC
  Administered 2019-07-23: 0.5 mg via INTRAVENOUS
  Filled 2019-07-23: qty 1

## 2019-07-23 MED ORDER — BISACODYL 10 MG RE SUPP: 10.0000 mg | Freq: Every day | RECTAL | Status: DC | PRN

## 2019-07-23 MED ORDER — METOCLOPRAMIDE HCL 5 MG PO TABS: 5.0000 mg | ORAL_TABLET | Freq: Three times a day (TID) | ORAL | Status: DC | PRN

## 2019-07-23 MED ORDER — LEVOTHYROXINE SODIUM 88 MCG PO TABS
88.0000 ug | ORAL_TABLET | Freq: Every day | ORAL | Status: DC
  Administered 2019-07-24 – 2019-07-25 (×2): 88 ug via ORAL
  Filled 2019-07-23 (×2): qty 1

## 2019-07-23 MED ORDER — SODIUM CHLORIDE 0.9 % IV SOLN
INTRAVENOUS | Status: DC | PRN
  Administered 2019-07-23: 40 ug/min via INTRAVENOUS

## 2019-07-23 MED ORDER — DEXAMETHASONE SODIUM PHOSPHATE 10 MG/ML IJ SOLN
INTRAMUSCULAR | Status: AC
  Filled 2019-07-23: qty 1

## 2019-07-23 MED ORDER — ONDANSETRON HCL 4 MG/2ML IJ SOLN
INTRAMUSCULAR | Status: AC
  Filled 2019-07-23: qty 2

## 2019-07-23 MED ORDER — FLEET ENEMA 7-19 GM/118ML RE ENEM: 1.0000 | ENEMA | Freq: Once | RECTAL | Status: DC | PRN

## 2019-07-23 MED ORDER — ACETAMINOPHEN 10 MG/ML IV SOLN
INTRAVENOUS | Status: AC
  Filled 2019-07-23: qty 100

## 2019-07-23 MED ORDER — DEXAMETHASONE SODIUM PHOSPHATE 10 MG/ML IJ SOLN
10.0000 mg | Freq: Once | INTRAMUSCULAR | Status: AC
  Administered 2019-07-24: 10 mg via INTRAVENOUS
  Filled 2019-07-23: qty 1

## 2019-07-23 MED ORDER — METHOCARBAMOL 500 MG IVPB - SIMPLE MED
INTRAVENOUS | Status: AC
  Filled 2019-07-23: qty 50

## 2019-07-23 MED ORDER — PHENYLEPHRINE 40 MCG/ML (10ML) SYRINGE FOR IV PUSH (FOR BLOOD PRESSURE SUPPORT)
PREFILLED_SYRINGE | INTRAVENOUS | Status: DC | PRN
  Administered 2019-07-23 (×3): 80 ug via INTRAVENOUS

## 2019-07-23 MED ORDER — DEXAMETHASONE SODIUM PHOSPHATE 10 MG/ML IJ SOLN
8.0000 mg | Freq: Once | INTRAMUSCULAR | Status: AC
  Administered 2019-07-23: 8 mg via INTRAVENOUS

## 2019-07-23 MED ORDER — CEFAZOLIN SODIUM-DEXTROSE 2-4 GM/100ML-% IV SOLN
INTRAVENOUS | Status: AC
  Filled 2019-07-23: qty 100

## 2019-07-23 MED ORDER — DOCUSATE SODIUM 100 MG PO CAPS
100.0000 mg | ORAL_CAPSULE | Freq: Two times a day (BID) | ORAL | Status: DC
  Administered 2019-07-24 – 2019-07-25 (×3): 100 mg via ORAL
  Filled 2019-07-23 (×3): qty 1

## 2019-07-23 MED ORDER — BUPIVACAINE IN DEXTROSE 0.75-8.25 % IT SOLN
INTRATHECAL | Status: DC | PRN
  Administered 2019-07-23: 1.6 mL via INTRATHECAL

## 2019-07-23 MED ORDER — POVIDONE-IODINE 10 % EX SWAB
2.0000 "application " | Freq: Once | CUTANEOUS | Status: AC
  Administered 2019-07-23: 2 via TOPICAL

## 2019-07-23 MED ORDER — SODIUM CHLORIDE 0.9 % IV SOLN
INTRAVENOUS | Status: DC
  Administered 2019-07-23: 20:00:00 via INTRAVENOUS

## 2019-07-23 MED ORDER — ONDANSETRON HCL 4 MG/2ML IJ SOLN
INTRAMUSCULAR | Status: DC | PRN
  Administered 2019-07-23: 4 mg via INTRAVENOUS

## 2019-07-23 MED ORDER — HYDROCODONE-ACETAMINOPHEN 5-325 MG PO TABS
1.0000 | ORAL_TABLET | ORAL | Status: DC | PRN
  Administered 2019-07-23 – 2019-07-25 (×8): 2 via ORAL
  Filled 2019-07-23 (×9): qty 2

## 2019-07-23 MED ORDER — METOCLOPRAMIDE HCL 5 MG/ML IJ SOLN: 5.0000 mg | Freq: Three times a day (TID) | INTRAMUSCULAR | Status: DC | PRN

## 2019-07-23 MED ORDER — PROPOFOL 500 MG/50ML IV EMUL
INTRAVENOUS | Status: DC | PRN
  Administered 2019-07-23: 50 ug/kg/min via INTRAVENOUS

## 2019-07-23 MED ORDER — POLYETHYLENE GLYCOL 3350 17 G PO PACK: 17.0000 g | PACK | Freq: Every day | ORAL | Status: DC | PRN

## 2019-07-23 MED ORDER — 0.9 % SODIUM CHLORIDE (POUR BTL) OPTIME
TOPICAL | Status: DC | PRN
  Administered 2019-07-23: 1000 mL

## 2019-07-23 MED ORDER — TRANEXAMIC ACID-NACL 1000-0.7 MG/100ML-% IV SOLN
1000.0000 mg | INTRAVENOUS | Status: AC
  Administered 2019-07-23: 1000 mg via INTRAVENOUS

## 2019-07-23 MED ORDER — BUPROPION HCL ER (XL) 300 MG PO TB24
300.0000 mg | ORAL_TABLET | Freq: Every day | ORAL | Status: DC
  Administered 2019-07-24 – 2019-07-25 (×2): 300 mg via ORAL
  Filled 2019-07-23 (×2): qty 1

## 2019-07-23 MED ORDER — PHENOL 1.4 % MT LIQD: 1.0000 | OROMUCOSAL | Status: DC | PRN

## 2019-07-23 MED ORDER — ROSUVASTATIN CALCIUM 10 MG PO TABS
10.0000 mg | ORAL_TABLET | Freq: Every day | ORAL | Status: DC
  Administered 2019-07-24 – 2019-07-25 (×2): 10 mg via ORAL
  Filled 2019-07-23 (×2): qty 1

## 2019-07-23 MED ORDER — FENTANYL CITRATE (PF) 100 MCG/2ML IJ SOLN
INTRAMUSCULAR | Status: AC
  Filled 2019-07-23: qty 2

## 2019-07-23 MED ORDER — DIPHENHYDRAMINE HCL 12.5 MG/5ML PO ELIX
12.5000 mg | ORAL_SOLUTION | ORAL | Status: DC | PRN
  Administered 2019-07-24: 25 mg via ORAL
  Filled 2019-07-23: qty 10

## 2019-07-23 MED ORDER — CEFAZOLIN SODIUM-DEXTROSE 2-4 GM/100ML-% IV SOLN
2.0000 g | Freq: Four times a day (QID) | INTRAVENOUS | Status: AC
  Administered 2019-07-23 – 2019-07-24 (×2): 2 g via INTRAVENOUS
  Filled 2019-07-23 (×2): qty 100

## 2019-07-23 MED ORDER — ACETAMINOPHEN 500 MG PO TABS
500.0000 mg | ORAL_TABLET | Freq: Four times a day (QID) | ORAL | Status: AC
  Filled 2019-07-23: qty 1

## 2019-07-23 MED ORDER — LACTATED RINGERS IV SOLN
INTRAVENOUS | Status: DC
  Administered 2019-07-23 (×2): via INTRAVENOUS

## 2019-07-23 MED ORDER — ACETAMINOPHEN 325 MG PO TABS: 325.0000 mg | ORAL_TABLET | Freq: Four times a day (QID) | ORAL | Status: DC | PRN

## 2019-07-23 MED ORDER — WATER FOR IRRIGATION, STERILE IR SOLN
Status: DC | PRN
  Administered 2019-07-23 (×2): 1000 mL

## 2019-07-23 MED ORDER — ASPIRIN EC 325 MG PO TBEC
325.0000 mg | DELAYED_RELEASE_TABLET | Freq: Two times a day (BID) | ORAL | Status: DC
  Administered 2019-07-24 – 2019-07-25 (×3): 325 mg via ORAL
  Filled 2019-07-23 (×3): qty 1

## 2019-07-23 MED ORDER — METHOCARBAMOL 500 MG PO TABS
500.0000 mg | ORAL_TABLET | Freq: Four times a day (QID) | ORAL | Status: DC | PRN
  Administered 2019-07-24 (×2): 500 mg via ORAL
  Filled 2019-07-23 (×3): qty 1

## 2019-07-23 MED ORDER — METHOCARBAMOL 500 MG IVPB - SIMPLE MED
500.0000 mg | Freq: Four times a day (QID) | INTRAVENOUS | Status: DC | PRN
  Administered 2019-07-23: 500 mg via INTRAVENOUS
  Filled 2019-07-23: qty 50

## 2019-07-23 MED ORDER — CEFAZOLIN SODIUM-DEXTROSE 2-4 GM/100ML-% IV SOLN
2.0000 g | INTRAVENOUS | Status: AC
  Administered 2019-07-23: 2 g via INTRAVENOUS

## 2019-07-23 MED ORDER — ONDANSETRON HCL 4 MG/2ML IJ SOLN: 4.0000 mg | Freq: Four times a day (QID) | INTRAMUSCULAR | Status: DC | PRN

## 2019-07-23 SURGICAL SUPPLY — 45 items
BAG DECANTER FOR FLEXI CONT (MISCELLANEOUS) IMPLANT
BAG SPEC THK2 15X12 ZIP CLS (MISCELLANEOUS)
BAG ZIPLOCK 12X15 (MISCELLANEOUS) IMPLANT
BLADE SAG 18X100X1.27 (BLADE) ×2 IMPLANT
COVER PERINEAL POST (MISCELLANEOUS) ×2 IMPLANT
COVER SURGICAL LIGHT HANDLE (MISCELLANEOUS) ×2 IMPLANT
COVER WAND RF STERILE (DRAPES) IMPLANT
CUP ACETBLR 48 OD SECTOR II (Hips) ×1 IMPLANT
DECANTER SPIKE VIAL GLASS SM (MISCELLANEOUS) ×2 IMPLANT
DRAPE STERI IOBAN 125X83 (DRAPES) ×2 IMPLANT
DRAPE U-SHAPE 47X51 STRL (DRAPES) ×4 IMPLANT
DRSG ADAPTIC 3X8 NADH LF (GAUZE/BANDAGES/DRESSINGS) ×2 IMPLANT
DRSG MEPILEX BORDER 4X4 (GAUZE/BANDAGES/DRESSINGS) ×2 IMPLANT
DRSG MEPILEX BORDER 4X8 (GAUZE/BANDAGES/DRESSINGS) ×2 IMPLANT
DURAPREP 26ML APPLICATOR (WOUND CARE) ×2 IMPLANT
ELECT REM PT RETURN 15FT ADLT (MISCELLANEOUS) ×2 IMPLANT
EVACUATOR 1/8 PVC DRAIN (DRAIN) ×2 IMPLANT
GLOVE BIO SURGEON STRL SZ 6 (GLOVE) IMPLANT
GLOVE BIO SURGEON STRL SZ7 (GLOVE) IMPLANT
GLOVE BIO SURGEON STRL SZ8 (GLOVE) ×2 IMPLANT
GLOVE BIOGEL PI IND STRL 6.5 (GLOVE) IMPLANT
GLOVE BIOGEL PI IND STRL 7.0 (GLOVE) IMPLANT
GLOVE BIOGEL PI IND STRL 8 (GLOVE) ×1 IMPLANT
GLOVE BIOGEL PI INDICATOR 6.5 (GLOVE)
GLOVE BIOGEL PI INDICATOR 7.0 (GLOVE)
GLOVE BIOGEL PI INDICATOR 8 (GLOVE) ×1
GOWN STRL REUS W/TWL LRG LVL3 (GOWN DISPOSABLE) ×2 IMPLANT
GOWN STRL REUS W/TWL XL LVL3 (GOWN DISPOSABLE) IMPLANT
HEAD FEM STD 28X+1.5 STRL (Hips) ×1 IMPLANT
HOLDER FOLEY CATH W/STRAP (MISCELLANEOUS) ×2 IMPLANT
KIT TURNOVER KIT A (KITS) IMPLANT
LINER MARATHON 28 48 (Hips) ×1 IMPLANT
MANIFOLD NEPTUNE II (INSTRUMENTS) ×2 IMPLANT
PACK ANTERIOR HIP CUSTOM (KITS) ×2 IMPLANT
STEM FEMORAL SZ5 HIGH ACTIS (Stem) ×1 IMPLANT
STRIP CLOSURE SKIN 1/2X4 (GAUZE/BANDAGES/DRESSINGS) ×2 IMPLANT
SUT ETHIBOND NAB CT1 #1 30IN (SUTURE) ×2 IMPLANT
SUT MNCRL AB 4-0 PS2 18 (SUTURE) ×2 IMPLANT
SUT STRATAFIX 0 PDS 27 VIOLET (SUTURE) ×2
SUT VIC AB 2-0 CT1 27 (SUTURE) ×4
SUT VIC AB 2-0 CT1 TAPERPNT 27 (SUTURE) ×2 IMPLANT
SUTURE STRATFX 0 PDS 27 VIOLET (SUTURE) ×1 IMPLANT
SYR 50ML LL SCALE MARK (SYRINGE) IMPLANT
TRAY FOLEY MTR SLVR 16FR STAT (SET/KITS/TRAYS/PACK) ×2 IMPLANT
YANKAUER SUCT BULB TIP 10FT TU (MISCELLANEOUS) ×2 IMPLANT

## 2019-07-23 NOTE — Interval H&P Note (Signed)
History and Physical Interval Note:  07/23/2019 3:09 PM  Margie Billet Zuzanna Maroney  has presented today for surgery, with the diagnosis of left hip osteoarthritis.  The various methods of treatment have been discussed with the patient and family. After consideration of risks, benefits and other options for treatment, the patient has consented to  Procedure(s): TOTAL HIP ARTHROPLASTY ANTERIOR APPROACH (Left) as a surgical intervention.  The patient's history has been reviewed, patient examined, no change in status, stable for surgery.  I have reviewed the patient's chart and labs.  Questions were answered to the patient's satisfaction.     Pilar Plate Shauntell Iglesia

## 2019-07-23 NOTE — Anesthesia Postprocedure Evaluation (Signed)
Anesthesia Post Note  Patient: Elaine Howell  Procedure(s) Performed: TOTAL HIP ARTHROPLASTY ANTERIOR APPROACH (Left Hip)     Patient location during evaluation: PACU Anesthesia Type: Spinal Level of consciousness: oriented and awake and alert Pain management: pain level controlled Vital Signs Assessment: post-procedure vital signs reviewed and stable Respiratory status: spontaneous breathing, respiratory function stable and patient connected to nasal cannula oxygen Cardiovascular status: blood pressure returned to baseline and stable Postop Assessment: no headache, no backache and no apparent nausea or vomiting Anesthetic complications: no    Last Vitals:  Vitals:   07/23/19 1700 07/23/19 1715  BP: 90/72 115/66  Pulse: 66 70  Resp: 20 15  Temp:    SpO2: 100% 98%    Last Pain:  Vitals:   07/23/19 1715  TempSrc:   PainSc: 6     LLE Motor Response: Purposeful movement (07/23/19 1715) LLE Sensation: Pain;Tingling (07/23/19 1715) RLE Motor Response: Purposeful movement (07/23/19 1715) RLE Sensation: Numbness (07/23/19 1715) L Sensory Level: L4-Anterior knee, lower leg (07/23/19 1715) R Sensory Level: L4-Anterior knee, lower leg (07/23/19 1715)  Jameson Tormey DAVID

## 2019-07-23 NOTE — Plan of Care (Signed)
Continue current POC 

## 2019-07-23 NOTE — Transfer of Care (Signed)
Immediate Anesthesia Transfer of Care Note  Patient: Elaine Howell  Procedure(s) Performed: TOTAL HIP ARTHROPLASTY ANTERIOR APPROACH (Left Hip)  Patient Location: PACU  Anesthesia Type:MAC and Spinal  Level of Consciousness: awake, alert , oriented and patient cooperative  Airway & Oxygen Therapy: Patient Spontanous Breathing and Patient connected to face mask oxygen  Post-op Assessment: Report given to RN and Post -op Vital signs reviewed and stable  Post vital signs: Reviewed and stable  Last Vitals:  Vitals Value Taken Time  BP 108/61 07/23/19 1652  Temp    Pulse 66 07/23/19 1655  Resp 19 07/23/19 1655  SpO2 100 % 07/23/19 1655  Vitals shown include unvalidated device data.  Last Pain:  Vitals:   07/23/19 1439  TempSrc:   PainSc: 4       Patients Stated Pain Goal: 3 (36/14/43 1540)  Complications: No apparent anesthesia complications

## 2019-07-23 NOTE — Anesthesia Procedure Notes (Signed)
Spinal  Patient location during procedure: OR Start time: 07/23/2019 3:25 PM End time: 07/23/2019 3:30 PM Staffing Anesthesiologist: Lyn Hollingshead, MD Performed: anesthesiologist  Preanesthetic Checklist Completed: patient identified, site marked, surgical consent, pre-op evaluation, timeout performed, IV checked, risks and benefits discussed and monitors and equipment checked Spinal Block Patient position: sitting Prep: site prepped and draped and DuraPrep Patient monitoring: continuous pulse ox and blood pressure Approach: midline Location: L3-4 Injection technique: single-shot Needle Needle type: Pencan  Needle gauge: 24 G Needle length: 10 cm Needle insertion depth: 6 cm Assessment Sensory level: T6

## 2019-07-23 NOTE — Anesthesia Preprocedure Evaluation (Signed)
Anesthesia Evaluation  Patient identified by MRN, date of birth, ID band Patient awake    Reviewed: Allergy & Precautions, NPO status , Patient's Chart, lab work & pertinent test results  Airway Mallampati: I  TM Distance: >3 FB Neck ROM: Full    Dental   Pulmonary former smoker,    Pulmonary exam normal        Cardiovascular hypertension, Pt. on medications Normal cardiovascular exam     Neuro/Psych Depression    GI/Hepatic   Endo/Other    Renal/GU      Musculoskeletal   Abdominal   Peds  Hematology   Anesthesia Other Findings   Reproductive/Obstetrics                             Anesthesia Physical Anesthesia Plan  ASA: III  Anesthesia Plan: Spinal   Post-op Pain Management:    Induction: Intravenous  PONV Risk Score and Plan: 2  Airway Management Planned:   Additional Equipment:   Intra-op Plan:   Post-operative Plan:   Informed Consent: I have reviewed the patients History and Physical, chart, labs and discussed the procedure including the risks, benefits and alternatives for the proposed anesthesia with the patient or authorized representative who has indicated his/her understanding and acceptance.       Plan Discussed with: CRNA and Surgeon  Anesthesia Plan Comments:         Anesthesia Quick Evaluation

## 2019-07-23 NOTE — Op Note (Signed)
OPERATIVE REPORT- TOTAL HIP ARTHROPLASTY   PREOPERATIVE DIAGNOSIS: Osteoarthritis of the Right hip.   POSTOPERATIVE DIAGNOSIS: Osteoarthritis of the Right  hip.   PROCEDURE: Right total hip arthroplasty, anterior approach.   SURGEON: Gaynelle Arabian, MD   ASSISTANT: Ardeen Jourdain, PA-C  ANESTHESIA:  Spinal  ESTIMATED BLOOD LOSS:-350 mL    DRAINS: Hemovac x1.   COMPLICATIONS: None   CONDITION: PACU - hemodynamically stable.   BRIEF CLINICAL NOTE: Elaine Howell is a 73 y.o. female who has advanced end-  stage arthritis of their Left  hip with progressively worsening pain and  dysfunction.The patient has failed nonoperative management and presents for  total hip arthroplasty.   PROCEDURE IN DETAIL: After successful administration of spinal  anesthetic, the traction boots for the [facility] bed were placed on both  feet and the patient was placed onto the [facility] bed, boots placed into the leg  holders. The Left hip was then isolated from the perineum with plastic  drapes and prepped and draped in the usual sterile fashion. ASIS and  greater trochanter were marked and a oblique incision was made, starting  at about 1 cm lateral and 2 cm distal to the ASIS and coursing towards  the anterior cortex of the femur. The skin was cut with a 10 blade  through subcutaneous tissue to the level of the fascia overlying the  tensor fascia lata muscle. The fascia was then incised in line with the  incision at the junction of the anterior third and posterior 2/3rd. The  muscle was teased off the fascia and then the interval between the TFL  and the rectus was developed. The Hohmann retractor was then placed at  the top of the femoral neck over the capsule. The vessels overlying the  capsule were cauterized and the fat on top of the capsule was removed.  A Hohmann retractor was then placed anterior underneath the rectus  femoris to give exposure to the entire anterior capsule. A  T-shaped  capsulotomy was performed. The edges were tagged and the femoral head  was identified.       Osteophytes are removed off the superior acetabulum.  The femoral neck was then cut in situ with an oscillating saw. Traction  was then applied to the left lower extremity utilizing the [facility]  traction. The femoral head was then removed. Retractors were placed  around the acetabulum and then circumferential removal of the labrum was  performed. Osteophytes were also removed. Reaming starts at 45 mm to  medialize and  Increased in 2 mm increments to 47 mm. We reamed in  approximately 40 degrees of abduction, 20 degrees anteversion. A 48 mm  pinnacle acetabular shell was then impacted in anatomic position under  fluoroscopic guidance with excellent purchase. We did not need to place  any additional dome screws. A 28 mm neutral + 4 marathon liner was then  placed into the acetabular shell.       The femoral lift was then placed along the lateral aspect of the femur  just distal to the vastus ridge. The leg was  externally rotated and capsule  was stripped off the inferior aspect of the femoral neck down to the  level of the lesser trochanter, this was done with electrocautery. The femur was lifted after this was performed. The  leg was then placed in an extended and adducted position essentially delivering the femur. We also removed the capsule superiorly and the piriformis from the  piriformis fossa to gain excellent exposure of the  proximal femur. Rongeur was used to remove some cancellous bone to get  into the lateral portion of the proximal femur for placement of the  initial starter reamer. The starter broaches was placed  the starter broach  and was shown to go down the center of the canal. Broaching  with the Actis system was then performed starting at size 0  coursing  Up to size 5. A size 5 had excellent torsional and rotational  and axial stability. The trial high offset neck was then  placed  with a 28 + 1.5 trial head. The hip was then reduced. We confirmed that  the stem was in the canal both on AP and lateral x-rays. It also has excellent sizing. The hip was reduced with outstanding stability through full extension and full external rotation.. AP pelvis was taken and the leg lengths were measured and found to be equal. Hip was then dislocated again and the femoral head and neck removed. The  femoral broach was removed. Size 5 Actis stem with a high offset  neck was then impacted into the femur following native anteversion. Has  excellent purchase in the canal. Excellent torsional and rotational and  axial stability. It is confirmed to be in the canal on AP and lateral  fluoroscopic views. The 28 + 1.5 metal head was placed and the hip  reduced with outstanding stability. Again AP pelvis was taken and it  confirmed that the leg lengths were equal. The wound was then copiously  irrigated with saline solution and the capsule reattached and repaired  with Ethibond suture. 30 ml of .25% Bupivicaine was  injected into the capsule and into the edge of the tensor fascia lata as well as subcutaneous tissue. The fascia overlying the tensor fascia lata was then closed with a running #1 V-Loc. Subcu was closed with interrupted 2-0 Vicryl and subcuticular running 4-0 Monocryl. Incision was cleaned  and dried. Steri-Strips and a bulky sterile dressing applied. Hemovac  drain was hooked to suction and then the patient was awakened and transported to  recovery in stable condition.        Please note that a surgical assistant was a medical necessity for this procedure to perform it in a safe and expeditious manner. Assistant was necessary to provide appropriate retraction of vital neurovascular structures and to prevent femoral fracture and allow for anatomic placement of the prosthesis.  Ollen Gross, M.D.

## 2019-07-23 NOTE — Anesthesia Procedure Notes (Signed)
Procedure Name: MAC Date/Time: 07/23/2019 3:16 PM Performed by: West Pugh, CRNA Pre-anesthesia Checklist: Patient identified, Emergency Drugs available, Suction available, Patient being monitored and Timeout performed Patient Re-evaluated:Patient Re-evaluated prior to induction Oxygen Delivery Method: Simple face mask Preoxygenation: Pre-oxygenation with 100% oxygen Induction Type: IV induction Placement Confirmation: positive ETCO2 Dental Injury: Teeth and Oropharynx as per pre-operative assessment

## 2019-07-23 NOTE — Discharge Instructions (Signed)
°Dr. Frank Aluisio °Total Joint Specialist °[facility] °[address] °[place], [place] [address] °[phone] ° °ANTERIOR APPROACH TOTAL HIP REPLACEMENT POSTOPERATIVE DIRECTIONS ° ° °Hip Rehabilitation, Guidelines Following Surgery  °The results of a hip operation are greatly improved after range of motion and muscle strengthening exercises. Follow all safety measures which are given to protect your hip. If any of these exercises cause increased pain or swelling in your joint, decrease the amount until you are comfortable again. Then slowly increase the exercises. Call your caregiver if you have problems or questions.  ° °HOME CARE INSTRUCTIONS  °• Remove items at home which could result in a fall. This includes throw rugs or furniture in walking pathways.  °· ICE to the affected hip every three hours for 30 minutes at a time and then as needed for pain and swelling.  Continue to use ice on the hip for pain and swelling from surgery. You may notice swelling that will progress down to the foot and ankle.  This is normal after surgery.  Elevate the leg when you are not up walking on it.   °· Continue to use the breathing machine which will help keep your temperature down.  It is common for your temperature to cycle up and down following surgery, especially at night when you are not up moving around and exerting yourself.  The breathing machine keeps your lungs expanded and your temperature down. ° °DIET °You may resume your previous home diet once your are discharged from the hospital. ° °DRESSING / WOUND CARE / SHOWERING °You may shower 3 days after surgery, but keep the wounds dry during showering.  You may use an occlusive plastic wrap (Press'n Seal for example), NO SOAKING/SUBMERGING IN THE BATHTUB.  If the bandage gets wet, change with a clean dry gauze.  If the incision gets wet, pat the wound dry with a clean towel. °You may start showering once you are discharged home but do not submerge the  incision under water. Just pat the incision dry and apply a dry gauze dressing on daily. °Change the surgical dressing daily and reapply a dry dressing each time. ° °ACTIVITY °Walk with your walker as instructed. °Use walker as long as suggested by your caregivers. °Avoid periods of inactivity such as sitting longer than an hour when not asleep. This helps prevent blood clots.  °You may resume a sexual relationship in one month or when given the OK by your doctor.  °You may return to work once you are cleared by your doctor.  °Do not drive a car for 6 weeks or until released by you surgeon.  °Do not drive while taking narcotics. ° °WEIGHT BEARING °Weight bearing as tolerated with assist device (walker, cane, etc) as directed, use it as long as suggested by your surgeon or therapist, typically at least 4-6 weeks. ° °POSTOPERATIVE CONSTIPATION PROTOCOL °Constipation - defined medically as fewer than three stools per week and severe constipation as less than one stool per week. ° °One of the most common issues patients have following surgery is constipation.  Even if you have a regular bowel pattern at home, your normal regimen is likely to be disrupted due to multiple reasons following surgery.  Combination of anesthesia, postoperative narcotics, change in appetite and fluid intake all can affect your bowels.  In order to avoid complications following surgery, here are some recommendations in order to help you during your recovery period. ° °Colace (docusate) - Pick up an over-the-counter form   of Colace or another stool softener and take twice a day as long as you are requiring postoperative pain medications.  Take with a full glass of water daily.  If you experience loose stools or diarrhea, hold the colace until you stool forms back up.  If your symptoms do not get better within 1 week or if they get worse, check with your doctor. ° °Dulcolax (bisacodyl) - Pick up over-the-counter and take as directed by the product  packaging as needed to assist with the movement of your bowels.  Take with a full glass of water.  Use this product as needed if not relieved by Colace only.  ° °MiraLax (polyethylene glycol) - Pick up over-the-counter to have on hand.  MiraLax is a solution that will increase the amount of water in your bowels to assist with bowel movements.  Take as directed and can mix with a glass of water, juice, soda, coffee, or tea.  Take if you go more than two days without a movement. °Do not use MiraLax more than once per day. Call your doctor if you are still constipated or irregular after using this medication for 7 days in a row. ° °If you continue to have problems with postoperative constipation, please contact the office for further assistance and recommendations.  If you experience "the worst abdominal pain ever" or develop nausea or vomiting, please contact the office immediatly for further recommendations for treatment. ° °ITCHING ° If you experience itching with your medications, try taking only a single pain pill, or even half a pain pill at a time.  You can also use Benadryl over the counter for itching or also to help with sleep.  ° °TED HOSE STOCKINGS °Wear the elastic stockings on both legs for three weeks following surgery during the day but you may remove then at night for sleeping. ° °MEDICATIONS °See your medication summary on the “After Visit Summary” that the nursing staff will review with you prior to discharge.  You may have some home medications which will be placed on hold until you complete the course of blood thinner medication.  It is important for you to complete the blood thinner medication as prescribed by your surgeon.  Continue your approved medications as instructed at time of discharge. ° °PRECAUTIONS °If you experience chest pain or shortness of breath - call 911 immediately for transfer to the hospital emergency department.  °If you develop a fever greater that 101 F, purulent drainage  from wound, increased redness or drainage from wound, foul odor from the wound/dressing, or calf pain - CONTACT YOUR SURGEON.   °                                                °FOLLOW-UP APPOINTMENTS °Make sure you keep all of your appointments after your operation with your surgeon and caregivers. You should call the office at the above phone number and make an appointment for approximately two weeks after the date of your surgery or on the date instructed by your surgeon outlined in the "After Visit Summary". ° °RANGE OF MOTION AND STRENGTHENING EXERCISES  °These exercises are designed to help you keep full movement of your hip joint. Follow your caregiver's or physical therapist's instructions. Perform all exercises about fifteen times, three times per day or as directed. Exercise both hips, even if you have   had only one joint replacement. These exercises can be done on a training (exercise) mat, on the floor, on a table or on a bed. Use whatever works the best and is most comfortable for you. Use music or television while you are exercising so that the exercises are a pleasant break in your day. This will make your life better with the exercises acting as a break in routine you can look forward to.  °• Lying on your back, slowly slide your foot toward your buttocks, raising your knee up off the floor. Then slowly slide your foot back down until your leg is straight again.  °• Lying on your back spread your legs as far apart as you can without causing discomfort.  °• Lying on your side, raise your upper leg and foot straight up from the floor as far as is comfortable. Slowly lower the leg and repeat.  °• Lying on your back, tighten up the muscle in the front of your thigh (quadriceps muscles). You can do this by keeping your leg straight and trying to raise your heel off the floor. This helps strengthen the largest muscle supporting your knee.  °• Lying on your back, tighten up the muscles of your buttocks both  with the legs straight and with the knee bent at a comfortable angle while keeping your heel on the floor.  ° °IF YOU ARE TRANSFERRED TO A SKILLED REHAB FACILITY °If the patient is transferred to a skilled rehab facility following release from the hospital, a list of the current medications will be sent to the facility for the patient to continue.  When discharged from the skilled rehab facility, please have the facility set up the patient's Home Health Physical Therapy prior to being released. Also, the skilled facility will be responsible for providing the patient with their medications at time of release from the facility to include their pain medication, the muscle relaxants, and their blood thinner medication. If the patient is still at the rehab facility at time of the two week follow up appointment, the skilled rehab facility will also need to assist the patient in arranging follow up appointment in our office and any transportation needs. ° °MAKE SURE YOU:  °• Understand these instructions.  °• Get help right away if you are not doing well or get worse.  ° ° °Pick up stool softner and laxative for home use following surgery while on pain medications. °Do not submerge incision under water. °Please use good hand washing techniques while changing dressing each day. °May shower starting three days after surgery. °Please use a clean towel to pat the incision dry following showers. °Continue to use ice for pain and swelling after surgery. °Do not use any lotions or creams on the incision until instructed by your surgeon. ° °

## 2019-07-24 ENCOUNTER — Encounter (HOSPITAL_COMMUNITY): Payer: Self-pay | Admitting: Orthopedic Surgery

## 2019-07-24 LAB — CBC
HCT: 33 % — ABNORMAL LOW (ref 36.0–46.0)
Hemoglobin: 10.5 g/dL — ABNORMAL LOW (ref 12.0–15.0)
MCH: 31.3 pg (ref 26.0–34.0)
MCHC: 31.8 g/dL (ref 30.0–36.0)
MCV: 98.2 fL (ref 80.0–100.0)
Platelets: 354 10*3/uL (ref 150–400)
RBC: 3.36 MIL/uL — ABNORMAL LOW (ref 3.87–5.11)
RDW: 13.1 % (ref 11.5–15.5)
WBC: 7 10*3/uL (ref 4.0–10.5)
nRBC: 0 % (ref 0.0–0.2)

## 2019-07-24 LAB — BASIC METABOLIC PANEL
Anion gap: 8 (ref 5–15)
BUN: 13 mg/dL (ref 8–23)
CO2: 23 mmol/L (ref 22–32)
Calcium: 9.1 mg/dL (ref 8.9–10.3)
Chloride: 105 mmol/L (ref 98–111)
Creatinine, Ser: 0.57 mg/dL (ref 0.44–1.00)
GFR calc Af Amer: >60 mL/min (ref >60)
GFR calc non Af Amer: >60 mL/min (ref >60)
Glucose, Bld: 136 mg/dL — ABNORMAL HIGH (ref 70–99)
Potassium: 4.3 mmol/L (ref 3.5–5.1)
Sodium: 136 mmol/L (ref 135–145)

## 2019-07-24 MED ORDER — METHOCARBAMOL 500 MG PO TABS: 500.0000 mg | ORAL_TABLET | Freq: Four times a day (QID) | ORAL | 0 refills | Status: DC | PRN

## 2019-07-24 MED ORDER — HYDROCODONE-ACETAMINOPHEN 5-325 MG PO TABS: 1.0000 | ORAL_TABLET | Freq: Four times a day (QID) | ORAL | 0 refills | Status: DC | PRN

## 2019-07-24 MED ORDER — ASPIRIN 325 MG PO TBEC: 325.0000 mg | DELAYED_RELEASE_TABLET | Freq: Two times a day (BID) | ORAL | 0 refills | Status: DC

## 2019-07-24 NOTE — Progress Notes (Addendum)
Physical Therapy Treatment Patient Details Name: Elaine Howell MRN: [id number] DOB: 06/23/1946 Today's Date: 07/24/2019    History of Present Illness Pt s/p R THR    PT Comments    Pt cooperative but ltd by c/o fatigue and ongoing pain.     Follow Up Recommendations  Follow surgeon's recommendation for DC plan and follow-up therapies     Equipment Recommendations  Rolling walker with 5" wheels;3in1 (PT)    Recommendations for Other Services OT consult     Precautions / Restrictions Precautions Precautions: Fall Restrictions Weight Bearing Restrictions: No Other Position/Activity Restrictions: WBAT    Mobility  Bed Mobility Overal bed mobility: Needs Assistance Bed Mobility: Supine to Sit Rolling: Min assist;Mod assist   Supine to sit: Min assist;Mod assist     General bed mobility comments: cues for sequence and use of R LE to self assist`; use of bed rail and pad to complete roll to L side ly  Transfers Overall transfer level: Needs assistance Equipment used: Rolling walker (2 wheeled) Transfers: Sit to/from Stand Sit to Stand: Min assist         General transfer comment: cues for LE management and use of UEs to self assist  Ambulation/Gait Ambulation/Gait assistance: Min assist Gait Distance (Feet): 64 Feet Assistive device: Rolling walker (2 wheeled) Gait Pattern/deviations: Step-to pattern;Decreased step length - right;Decreased step length - left;Shuffle;Trunk flexed Gait velocity: decr   General Gait Details: cues for sequence, posture and position from [employer]             Wheelchair Mobility    Modified Rankin (Stroke Patients Only)       Balance Overall balance assessment: Needs assistance Sitting-balance support: No upper extremity supported;Feet supported Sitting balance-Leahy Scale: Good       Standing balance-Leahy Scale: Poor                              Cognition Arousal/Alertness:  Awake/alert Behavior During Therapy: WFL for tasks assessed/performed Overall Cognitive Status: Within Functional Limits for tasks assessed                                        Exercises      General Comments        Pertinent Vitals/Pain Pain Assessment: 0-10 Pain Score: 6  Pain Location: R hip Pain Descriptors / Indicators: Aching;Sore Pain Intervention(s): Limited activity within patient's tolerance;Monitored during session;Premedicated before session;Ice applied    Home Living                      Prior Function            PT Goals (current goals can now be found in the care plan section) Acute Rehab PT Goals Patient Stated Goal: less pain PT Goal Formulation: With patient Time For Goal Achievement: 08/07/19 Potential to Achieve Goals: Good Progress towards PT goals: Progressing toward goals    Frequency    7X/week      PT Plan Current plan remains appropriate    Co-evaluation              AM-PAC PT "6 Clicks" Mobility   Outcome Measure  Help needed turning from your back to your side while in a flat bed without using bedrails?: A Lot Help needed moving from lying on  your back to sitting on the side of a flat bed without using bedrails?: A Lot Help needed moving to and from a bed to a chair (including a wheelchair)?: A Lot Help needed standing up from a chair using your arms (e.g., wheelchair or bedside chair)?: A Little Help needed to walk in hospital room?: A Little Help needed climbing 3-5 steps with a railing? : A Lot 6 Click Score: 14    End of Session Equipment Utilized During Treatment: Gait belt Activity Tolerance: Patient limited by pain;Patient limited by fatigue Patient left: in bed;with call bell/phone within reach Nurse Communication: Mobility status PT Visit Diagnosis: Difficulty in walking, not elsewhere classified (R26.2)     Time: 4097-3532 PT Time Calculation (min) (ACUTE ONLY): 30 min  Charges:   $Gait Training: 8-22 mins $Therapeutic Activity: 8-22 mins                     [place] Pager [phone] Office [phone]    Traniya Prichett 07/24/2019, 4:10 PM

## 2019-07-24 NOTE — Evaluation (Addendum)
Physical Therapy Evaluation Patient Details Name: Elaine Howell MRN: [id number] DOB: 08-Sep-1946 Today's Date: 07/24/2019   History of Present Illness  Pt s/p R THR  Clinical Impression  Pt s/p R THR and presents with decreased R LE strength/ROM and post op pain limiting functional mobility.  Pt should progress to dc home with family assist.    Follow Up Recommendations Follow surgeon's recommendation for DC plan and follow-up therapies    Equipment Recommendations  Rolling walker with 5" wheels;3in1 (PT)    Recommendations for Other Services OT consult     Precautions / Restrictions Precautions Precautions: Fall Restrictions Weight Bearing Restrictions: No Other Position/Activity Restrictions: WBAT      Mobility  Bed Mobility               General bed mobility comments: NT - pt up in recliner on arrival and in bathroom at end of session  Transfers Overall transfer level: Needs assistance Equipment used: Rolling walker (2 wheeled) Transfers: Sit to/from Stand Sit to Stand: Min assist;Mod assist         General transfer comment: cues for LE management and use of UEs to self assist  Ambulation/Gait Ambulation/Gait assistance: Min assist Gait Distance (Feet): 80 Feet Assistive device: Rolling walker (2 wheeled) Gait Pattern/deviations: Step-to pattern;Decreased step length - right;Decreased step length - left;Shuffle;Trunk flexed Gait velocity: decr   General Gait Details: cues for sequence, posture and position from [employer]            Wheelchair Mobility    Modified Rankin (Stroke Patients Only)       Balance Overall balance assessment: Needs assistance Sitting-balance support: No upper extremity supported;Feet supported Sitting balance-Leahy Scale: Good     Standing balance support: Bilateral upper extremity supported Standing balance-Leahy Scale: Poor                               Pertinent Vitals/Pain Pain  Assessment: 0-10 Pain Score: 6  Pain Location: R hip Pain Descriptors / Indicators: Aching;Sore Pain Intervention(s): Limited activity within patient's tolerance;Monitored during session;Premedicated before session;Ice applied    Home Living Family/patient expects to be discharged to:: Private residence Living Arrangements: Children Available Help at Discharge: Family Type of Home: House Home Access: Stairs to enter Entrance Stairs-Rails: None Secretary/administrator of Steps: 1 Home Layout: One level Home Equipment: None      Prior Function Level of Independence: Independent               Hand Dominance        Extremity/Trunk Assessment   Upper Extremity Assessment Upper Extremity Assessment: Overall WFL for tasks assessed    Lower Extremity Assessment Lower Extremity Assessment: RLE deficits/detail RLE Deficits / Details: Strength at hip 2/5 with AAROM at hip to 80 flex and 15 abd    Cervical / Trunk Assessment Cervical / Trunk Assessment: Normal  Communication   Communication: HOH  Cognition Arousal/Alertness: Awake/alert Behavior During Therapy: WFL for tasks assessed/performed Overall Cognitive Status: Within Functional Limits for tasks assessed                                        General Comments      Exercises Total Joint Exercises Ankle Circles/Pumps: AROM;Both;15 reps;Supine Quad Sets: AROM;Both;10 reps;Supine Heel Slides: AAROM;Right;Supine;15 reps Hip ABduction/ADduction: AAROM;Right;Supine;15 reps   Assessment/Plan  PT Assessment Patient needs continued PT services  PT Problem List Decreased strength;Decreased range of motion;Decreased activity tolerance;Decreased balance;Decreased mobility;Decreased knowledge of use of DME;Pain       PT Treatment Interventions DME instruction;Gait training;Stair training;Functional mobility training;Therapeutic activities;Therapeutic exercise;Patient/family education    PT Goals  (Current goals can be found in the Care Plan section)  Acute Rehab PT Goals Patient Stated Goal: less pain PT Goal Formulation: With patient Time For Goal Achievement: 08/07/19 Potential to Achieve Goals: Good    Frequency 7X/week   Barriers to discharge        Co-evaluation               AM-PAC PT "6 Clicks" Mobility  Outcome Measure Help needed turning from your back to your side while in a flat bed without using bedrails?: A Lot Help needed moving from lying on your back to sitting on the side of a flat bed without using bedrails?: A Lot Help needed moving to and from a bed to a chair (including a wheelchair)?: A Lot Help needed standing up from a chair using your arms (e.g., wheelchair or bedside chair)?: A Little Help needed to walk in hospital room?: A Little Help needed climbing 3-5 steps with a railing? : A Lot 6 Click Score: 14    End of Session Equipment Utilized During Treatment: Gait belt Activity Tolerance: Patient tolerated treatment well;Patient limited by pain Patient left: Other (comment)(bathroom) Nurse Communication: Mobility status PT Visit Diagnosis: Difficulty in walking, not elsewhere classified (R26.2)    Time: 8101-7510 PT Time Calculation (min) (ACUTE ONLY): 30 min   Charges:   PT Evaluation $PT Eval Low Complexity: 1 Low PT Treatments $Therapeutic Exercise: 8-22 mins        Debe Coder PT Acute Rehabilitation Services Pager [phone] Office [phone]   Olon Russ 07/24/2019, 12:51 PM

## 2019-07-24 NOTE — Progress Notes (Signed)
   Subjective: 1 Day Post-Op Procedure(s) (LRB): TOTAL HIP ARTHROPLASTY ANTERIOR APPROACH (Left) Patient reports pain as moderate.   Patient seen in rounds with Dr. Wynelle Link. Patient is well, and has had no acute complaints or problems other than pain in the left hip. She denies SOB and chest pain. Voiding well. Positive flatus. Hemovac removed at some point overnight.  Plan is to go Home after hospital stay.  Objective: Vital signs in last 24 hours: Temp:  [97.4 F (36.3 C)-98.7 F (37.1 C)] 98.6 F (37 C) (10/22 0519) Pulse Rate:  [66-89] 85 (10/22 0519) Resp:  [14-20] 18 (10/22 0519) BP: (90-163)/(60-91) 137/76 (10/22 0519) SpO2:  [96 %-100 %] 97 % (10/22 0519) Weight:  [78.5 kg] 78.5 kg (10/21 1439)  Intake/Output from previous day:  Intake/Output Summary (Last 24 hours) at 07/24/2019 0721 Last data filed at 07/24/2019 0600 Gross per 24 hour  Intake 2994.97 ml  Output 2105 ml  Net 889.97 ml     Labs: Recent Labs    07/24/19 0253  HGB 10.5*   Recent Labs    07/24/19 0253  WBC 7.0  RBC 3.36*  HCT 33.0*  PLT 354   Recent Labs    07/24/19 0253  NA 136  K 4.3  CL 105  CO2 23  BUN 13  CREATININE 0.57  GLUCOSE 136*  CALCIUM 9.1    EXAM General - Patient is Alert and Oriented Extremity - Neurologically intact Intact pulses distally Dorsiflexion/Plantar flexion intact No cellulitis present Compartment soft Dressing - dressing C/D/I Motor Function - intact, moving foot and toes well on exam.    Past Medical History:  Diagnosis Date  . Chronic insomnia   . Depression   . Heavy alcohol use 07/27/2010   Qualifier: Diagnosis of  By: Regis Bill MD, Standley Brooking Daily use     . Hyperlipidemia   . Hypertension   . INSOMNIA UNSPECIFIED 10/29/2007   Qualifier: Diagnosis of  By: Regis Bill MD, Standley Brooking   . LIVER FUNCTION TESTS, ABNORMAL 12/21/2008   Qualifier: Diagnosis of  By: Regis Bill MD, Standley Brooking   . Thyroiditis    positive antibodies    Assessment/Plan: 1 Day  Post-Op Procedure(s) (LRB): TOTAL HIP ARTHROPLASTY ANTERIOR APPROACH (Left) Principal Problem:   OA (osteoarthritis) of hip  Estimated body mass index is 30.65 kg/m as calculated from the following:   Height as of this encounter: 5\' 3"  (1.6 m).   Weight as of this encounter: 78.5 kg. Advance diet Up with therapy D/C IV fluids when tolerating POs well  DVT Prophylaxis - Aspirin Weight Bearing As Tolerated   Start therapy today. She will need two sessions but if she is progressing and meeting goals, DC home today. Follow up in office in 2 weeks. Discharge instructions.   Ardeen Jourdain, PA-C Orthopaedic Surgery 07/24/2019, 7:21 AM

## 2019-07-25 LAB — CBC
HCT: 31.2 % — ABNORMAL LOW (ref 36.0–46.0)
Hemoglobin: 10 g/dL — ABNORMAL LOW (ref 12.0–15.0)
MCH: 31.1 pg (ref 26.0–34.0)
MCHC: 32.1 g/dL (ref 30.0–36.0)
MCV: 96.9 fL (ref 80.0–100.0)
Platelets: 385 10*3/uL (ref 150–400)
RBC: 3.22 MIL/uL — ABNORMAL LOW (ref 3.87–5.11)
RDW: 13.2 % (ref 11.5–15.5)
WBC: 8.9 10*3/uL (ref 4.0–10.5)
nRBC: 0 % (ref 0.0–0.2)

## 2019-07-25 LAB — BASIC METABOLIC PANEL
Anion gap: 8 (ref 5–15)
BUN: 14 mg/dL (ref 8–23)
CO2: 24 mmol/L (ref 22–32)
Calcium: 9.1 mg/dL (ref 8.9–10.3)
Chloride: 104 mmol/L (ref 98–111)
Creatinine, Ser: 0.53 mg/dL (ref 0.44–1.00)
GFR calc Af Amer: >60 mL/min (ref >60)
GFR calc non Af Amer: >60 mL/min (ref >60)
Glucose, Bld: 123 mg/dL — ABNORMAL HIGH (ref 70–99)
Potassium: 3.9 mmol/L (ref 3.5–5.1)
Sodium: 136 mmol/L (ref 135–145)

## 2019-07-25 NOTE — Progress Notes (Deleted)
Physical Therapy Treatment Patient Details Name: Elaine Howell MRN: [id number] DOB: 1946/09/06 Today's Date: 07/25/2019    History of Present Illness Pt s/p R THR    PT Comments    POD #2 am session. Pt was very impulsive this morning and a little confused. Pt was able to complete one step during treatment however, her son will be here this afternoon and we will go over all of it with him.  General bed mobility comments: 50% VC's for use of bed rails and use of gait belt to assist moving L LE General transfer comment: 75% VC's for safety to use the walker and use of UE's to power up off of the bed  General stair comments: Pt required 75% VCs fpr correct technique using a walker on a step. Pt was very impulsive and c/o a lot of pain when going up   Follow Up Recommendations  Follow surgeon's recommendation for DC plan and follow-up therapies     Equipment Recommendations  Rolling walker with 5" wheels;3in1 (PT)    Recommendations for Other Services OT consult     Precautions / Restrictions Precautions Precautions: Fall Restrictions Weight Bearing Restrictions: No Other Position/Activity Restrictions: WBAT    Mobility  Bed Mobility Overal bed mobility: Modified Independent Bed Mobility: Supine to Sit Rolling: Min assist         General bed mobility comments: in chair  Transfers Overall transfer level: Needs assistance Equipment used: Rolling walker (2 wheeled) Transfers: Sit to/from Stand Sit to Stand: Min guard         General transfer comment: mod cues for safety and to slide leg forward as sitting was painful with leg under her  Ambulation/Gait Ambulation/Gait assistance: Min assist Gait Distance (Feet): 25 Feet Assistive device: Rolling walker (2 wheeled) Gait Pattern/deviations: Step-to pattern;Decreased step length - right;Decreased step length - left;Shuffle;Trunk flexed     General Gait Details: 75% VC's for proper gait pattern and good  posture   Stairs Stairs: Yes Stairs assistance: Min assist Stair Management: No rails;With walker Number of Stairs: 1 General stair comments: Pt required 75% VCs fpr correct technique using a walker on a step. Pt was very impulsive and c/o a lot of pain when going up   Wheelchair Mobility    Modified Rankin (Stroke Patients Only)       Balance                                            Cognition Arousal/Alertness: Awake/alert Behavior During Therapy: Impulsive Overall Cognitive Status: Within Functional Limits for tasks assessed                                 General Comments: decreased safety with sit to stand; attempted to stand without RW.  Cued for safety      Exercises Total Joint Exercises Ankle Circles/Pumps: AROM;Both;Seated;10 reps Quad Sets: AROM;Both;10 reps;Seated Gluteal Sets: AROM;Both;10 reps Short Arc Quad: AROM;10 reps;Right Heel Slides: AAROM;Right;15 reps;Seated Hip ABduction/ADduction: AAROM;Right;Seated;10 reps Long Arc Quad: AROM;AAROM;Right;10 reps;Seated    General Comments        Pertinent Vitals/Pain Pain Assessment: 0-10 Pain Score: 6  Faces Pain Scale: Hurts even more Pain Location: R hip Pain Descriptors / Indicators: Aching;Sore;Grimacing;Discomfort Pain Intervention(s): Monitored during session;Repositioned    Home Living Family/patient expects to  be discharged to:: Private residence Living Arrangements: Children Available Help at Discharge: Family Type of Home: House Home Access: Stairs to enter   Home Layout: One level Home Equipment: None      Prior Function Level of Independence: Independent      Comments: Pt said initially sister was coming to assist, but she was recently hospitalized. Now son is coming to assist her   PT Goals (current goals can now be found in the care plan section) Acute Rehab PT Goals Patient Stated Goal: less pain Progress towards PT goals: Progressing  toward goals    Frequency    7X/week      PT Plan Current plan remains appropriate    Co-evaluation              AM-PAC PT "6 Clicks" Mobility   Outcome Measure  Help needed turning from your back to your side while in a flat bed without using bedrails?: A Little Help needed moving from lying on your back to sitting on the side of a flat bed without using bedrails?: A Little Help needed moving to and from a bed to a chair (including a wheelchair)?: A Little Help needed standing up from a chair using your arms (e.g., wheelchair or bedside chair)?: A Little Help needed to walk in hospital room?: A Little Help needed climbing 3-5 steps with a railing? : A Little 6 Click Score: 18    End of Session Equipment Utilized During Treatment: Gait belt Activity Tolerance: Patient tolerated treatment well Patient left: in chair;with call bell/phone within reach;with chair alarm set Nurse Communication: Mobility status PT Visit Diagnosis: Difficulty in walking, not elsewhere classified (R26.2)     Time: 4332-9518 PT Time Calculation (min) (ACUTE ONLY): 25 min  Charges:  $Gait Training: 8-22 mins $Therapeutic Exercise: 8-22 mins $Self Care/Home Management: 8-22                     Marti Sleigh, SPTA [facility]

## 2019-07-25 NOTE — Progress Notes (Signed)
Reviewed discharge instructions, medications and dressing changes with patient and son. Patient verbalizes understanding. Educated patient on not drinking alcohol while narcotics and muscle relaxer. Patient verbalizes understanding and has no further questions.

## 2019-07-25 NOTE — Progress Notes (Signed)
   Subjective: 2 Days Post-Op Procedure(s) (LRB): TOTAL HIP ARTHROPLASTY ANTERIOR APPROACH (Left) Patient reports pain as mild.   Patient seen in rounds with Dr. Wynelle Link. Patient is well, and has had no acute complaints or problems other than discomfort in the left hip. She reports significantly improved pain compared to yesterday. She was able to ambulate 64 feet yesterday with PT. Patient states she feels ready to go home today. Voiding without difficulty, positive flatus. Denies CP, SHOB.  We will continue therapy today.   Objective: Vital signs in last 24 hours: Temp:  [99.2 F (37.3 C)-99.4 F (37.4 C)] 99.3 F (37.4 C) (10/23 0517) Pulse Rate:  [85-86] 86 (10/23 0517) Resp:  [14-18] 17 (10/23 0517) BP: (131-141)/(72-84) 141/74 (10/23 0517) SpO2:  [96 %-99 %] 97 % (10/23 0517)  Intake/Output from previous day:  Intake/Output Summary (Last 24 hours) at 07/25/2019 0722 Last data filed at 07/25/2019 0600 Gross per 24 hour  Intake 1401.1 ml  Output 1750 ml  Net -348.9 ml     Intake/Output this shift: No intake/output data recorded.  Labs: Recent Labs    07/24/19 0253 07/25/19 0235  HGB 10.5* 10.0*   Recent Labs    07/24/19 0253 07/25/19 0235  WBC 7.0 8.9  RBC 3.36* 3.22*  HCT 33.0* 31.2*  PLT 354 385   Recent Labs    07/24/19 0253 07/25/19 0235  NA 136 136  K 4.3 3.9  CL 105 104  CO2 23 24  BUN 13 14  CREATININE 0.57 0.53  GLUCOSE 136* 123*  CALCIUM 9.1 9.1   No results for input(s): LABPT, INR in the last 72 hours.  Exam: General - Patient is Alert and Oriented Extremity - Neurologically intact Sensation intact distally Intact pulses distally Dorsiflexion/Plantar flexion intact Dressing - dressing C/D/I Motor Function - intact, moving foot and toes well on exam.   Past Medical History:  Diagnosis Date  . Chronic insomnia   . Depression   . Heavy alcohol use 07/27/2010   Qualifier: Diagnosis of  By: Regis Bill MD, Standley Brooking Daily use     .  Hyperlipidemia   . Hypertension   . INSOMNIA UNSPECIFIED 10/29/2007   Qualifier: Diagnosis of  By: Regis Bill MD, Standley Brooking   . LIVER FUNCTION TESTS, ABNORMAL 12/21/2008   Qualifier: Diagnosis of  By: Regis Bill MD, Standley Brooking   . Thyroiditis    positive antibodies    Assessment/Plan: 2 Days Post-Op Procedure(s) (LRB): TOTAL HIP ARTHROPLASTY ANTERIOR APPROACH (Left) Principal Problem:   OA (osteoarthritis) of hip  Estimated body mass index is 30.65 kg/m as calculated from the following:   Height as of this encounter: 5\' 3"  (1.6 m).   Weight as of this encounter: 78.5 kg. Advance diet Up with therapy  DVT Prophylaxis - Aspirin Weight bearing as tolerated. D/C O2 and pulse ox and try on room air. Hemovac pulled without difficulty, will begin therapy.  Plan is to go Home after hospital stay. Plan for discharge today following 1-2 sessions of therapy as long as she is progressing and meeting her goals. She reports improved pain compared to yesterday. Follow up in the office in 2 weeks.   Griffith Citron, PA-C Orthopedic Surgery [phone] 07/25/2019, 7:22 AM

## 2019-07-25 NOTE — Evaluation (Signed)
Occupational Therapy Evaluation Patient Details Name: Elaine Howell MRN: [id number] DOB: 1946/01/05 Today's Date: 07/25/2019    History of Present Illness Pt s/p R THR   Clinical Impression   This 73 y o female was admitted for the above sx.  She was able to complete ADL with light min A.  She feels comfortable with toilet transfers and educated on shower transfers, but she didn't want to perform at time of evaluation.  Pt was a little impulsive.  Educated that RW must be in front of her prior to standing for safety. Recommend 24/7 assistance.     Follow Up Recommendations  Supervision/Assistance - 24 hour    Equipment Recommendations  3 in 1 bedside commode    Recommendations for Other Services       Precautions / Restrictions Precautions Precautions: Fall Restrictions Weight Bearing Restrictions: No Other Position/Activity Restrictions: WBAT      Mobility Bed Mobility Overal bed mobility: Modified Independent Bed Mobility: Supine to Sit Rolling: Min assist         General bed mobility comments: in chair  Transfers Overall transfer level: Needs assistance Equipment used: Rolling walker (2 wheeled) Transfers: Sit to/from Stand Sit to Stand: Min guard         General transfer comment: mod cues for safety and to slide leg forward as sitting was painful with leg under her    Balance                                           ADL either performed or assessed with clinical judgement   ADL Overall ADL's : Needs assistance/impaired Eating/Feeding: Independent   Grooming: Set up   Upper Body Bathing: Set up   Lower Body Bathing: Minimal assistance   Upper Body Dressing : Set up   Lower Body Dressing: Minimal assistance                 General ADL Comments: performed ADL from chair. Pt did not need to use bathroom at time of eval.  Cues for safety with sit to stand as she didn't have walker in front of her before she  attempted to stand up.  Pt is flexible and can don pants and underwear. She will need a little assistance with feet and socks. Pt reports she has been walking to the bathroom and she feels comfortable with this.  Educated on shower transfer.  Demonstrated sequence and gave her a handout. She did not want to practice     Vision         Perception     Praxis      Pertinent Vitals/Pain Pain Assessment: Faces Pain Score: 6  Faces Pain Scale: Hurts even more Pain Location: R hip Pain Descriptors / Indicators: Aching;Sore;Grimacing;Discomfort Pain Intervention(s): Limited activity within patient's tolerance;Monitored during session;Repositioned;Ice applied;Premedicated before session     Hand Dominance     Extremity/Trunk Assessment Upper Extremity Assessment Upper Extremity Assessment: Overall WFL for tasks assessed           Communication Communication Communication: HOH   Cognition Arousal/Alertness: Awake/alert Behavior During Therapy: Impulsive Overall Cognitive Status: Within Functional Limits for tasks assessed                                 General Comments: decreased safety with  sit to stand; attempted to stand without RW.  Cued for safety   General Comments       Exercises    Shoulder Instructions      Home Living Family/patient expects to be discharged to:: Private residence Living Arrangements: Children Available Help at Discharge: Family Type of Home: House Home Access: Stairs to enter Technical brewer of Steps: 1   Home Layout: One level     Bathroom Shower/Tub: Occupational psychologist: Standard     Home Equipment: None          Prior Functioning/Environment Level of Independence: Independent        Comments: Pt said initially sister was coming to assist, but she was recently hospitalized. Now son is coming to assist her        OT Problem List:        OT Treatment/Interventions:      OT  Goals(Current goals can be found in the care plan section) Acute Rehab OT Goals Patient Stated Goal: less pain OT Goal Formulation: All assessment and education complete, DC therapy  OT Frequency:     Barriers to D/C:            Co-evaluation              AM-PAC OT "6 Clicks" Daily Activity     Outcome Measure Help from another person eating meals?: None Help from another person taking care of personal grooming?: A Little Help from another person toileting, which includes using toliet, bedpan, or urinal?: A Little Help from another person bathing (including washing, rinsing, drying)?: A Little Help from another person to put on and taking off regular upper body clothing?: A Little Help from another person to put on and taking off regular lower body clothing?: A Little 6 Click Score: 19   End of Session    Activity Tolerance: Patient tolerated treatment well Patient left: in chair;with call bell/phone within reach;with chair alarm set  OT Visit Diagnosis: Pain Pain - Right/Left: Left Pain - part of body: Hip                Time: 5852-7782 OT Time Calculation (min): 18 min Charges:  OT General Charges $OT Visit: 1 Visit OT Evaluation $OT Eval Low Complexity: 1 Low  Lesle Chris, OTR/L Acute Rehabilitation Services [phone] [facility] pager [phone] office 07/25/2019  [place] 07/25/2019, 1:33 PM

## 2019-07-25 NOTE — Progress Notes (Signed)
Physical Therapy Treatment Patient Details Name: Elaine Howell MRN: [id number] DOB: 06-11-1946 Today's Date: 07/25/2019    History of Present Illness Pt s/p R THR    PT Comments    Pt was seen OOB and in recliner with her son present, who will be going home with her. Pt was still impulsive at times and had a hard time remembering which foot to goes up on the stair first. General transfer comment: min cues to use arms of chair instead of walker. Toilet transfer [place] with son present. VC's required for proper hand placement when sitting on toilet General Gait Details: 50% VC's for good posture and to stand closer to the walker. General stair comments: Pt required 75% VCs fpr correct technique with walker and steping "up with the good, down with the bad". Pt was impulsive and c/o of pain when going up. She was very vocal, son was present and will be going home with her.   Follow Up Recommendations  Follow surgeon's recommendation for DC plan and follow-up therapies     Equipment Recommendations  Rolling walker with 5" wheels;3in1 (PT)    Recommendations for Other Services OT consult     Precautions / Restrictions Precautions Precautions: Fall Restrictions Weight Bearing Restrictions: No Other Position/Activity Restrictions: WBAT    Mobility  Bed Mobility Overal bed mobility: Modified Independent Bed Mobility: Supine to Sit Rolling: Min assist         General bed mobility comments: Pt OOB in recliner  Transfers Overall transfer level: Modified independent Equipment used: Rolling walker (2 wheeled) Transfers: Sit to/from Stand Sit to Stand: Min guard         General transfer comment: min cues to use arms of chair instead of walker. Toilet transfter [place] with son present. VC's required for proper hand placement when sitting on toilet  Ambulation/Gait Ambulation/Gait assistance: Min assist Gait Distance (Feet): 35 Feet Assistive device: Rolling walker (2  wheeled) Gait Pattern/deviations: Step-to pattern;Decreased step length - right;Decreased step length - left;Trunk flexed;Shuffle     General Gait Details: 50% VC's for good posture   Stairs Stairs: Yes Stairs assistance: Min assist Stair Management: No rails;With walker Number of Stairs: 2 General stair comments: Pt required 75% VCs fpr correct technique with walker and steping "up with the good, down with the bad". Pt was very impulsive and c/o of pain when going up. She is very vocal, son was present and will be going home with her.   Wheelchair Mobility    Modified Rankin (Stroke Patients Only)       Balance                                            Cognition Arousal/Alertness: Awake/alert Behavior During Therapy: Impulsive Overall Cognitive Status: Within Functional Limits for tasks assessed                                 General Comments: decreased safety with sit to stand; attempted to stand without RW.  Cued for safety      Exercises Total Joint Exercises Ankle Circles/Pumps: AROM;Both;Seated;10 reps Quad Sets: AROM;Both;10 reps;Seated Gluteal Sets: AROM;Both;10 reps Short Arc Quad: AROM;10 reps;Right Heel Slides: AAROM;Right;15 reps;Seated Hip ABduction/ADduction: AAROM;Right;Seated;10 reps Long Arc Quad: AROM;AAROM;Right;10 reps;Seated    General Comments  Pertinent Vitals/Pain Pain Assessment: 0-10 Pain Score: 6  Faces Pain Scale: Hurts even more Pain Location: R hip Pain Descriptors / Indicators: Aching;Sore;Grimacing;Discomfort Pain Intervention(s): Monitored during session;Repositioned    Home Living Family/patient expects to be discharged to:: Private residence Living Arrangements: Children Available Help at Discharge: Family Type of Home: House Home Access: Stairs to enter   Home Layout: One level Home Equipment: None      Prior Function Level of Independence: Independent      Comments: Pt  said initially sister was coming to assist, but she was recently hospitalized. Now son is coming to assist her   PT Goals (current goals can now be found in the care plan section) Acute Rehab PT Goals Patient Stated Goal: less pain Progress towards PT goals: Progressing toward goals    Frequency    7X/week      PT Plan Current plan remains appropriate    Co-evaluation              AM-PAC PT "6 Clicks" Mobility   Outcome Measure  Help needed turning from your back to your side while in a flat bed without using bedrails?: A Little Help needed moving from lying on your back to sitting on the side of a flat bed without using bedrails?: A Little Help needed moving to and from a bed to a chair (including a wheelchair)?: A Little Help needed standing up from a chair using your arms (e.g., wheelchair or bedside chair)?: A Little Help needed to walk in hospital room?: A Little Help needed climbing 3-5 steps with a railing? : A Little 6 Click Score: 18    End of Session Equipment Utilized During Treatment: Gait belt Activity Tolerance: Patient tolerated treatment well Patient left: in chair;with call bell/phone within reach;with chair alarm set;with family/visitor present Nurse Communication: Mobility status(Nurse was notified pt was ready for discharge) PT Visit Diagnosis: Difficulty in walking, not elsewhere classified (R26.2)     Time: 9476-5465 PT Time Calculation (min) (ACUTE ONLY): 25 min  Charges:  $Gait Training: 8-22 mins $Therapeutic Exercise: 8-22 mins $Self Care/Home Management: 8-22                     Marti Sleigh, SPTA [facility]

## 2019-07-25 NOTE — Progress Notes (Signed)
Physical Therapy Treatment Patient Details Name: Elaine Howell MRN: [id number] DOB: December 21, 1945 Today's Date: 07/25/2019    History of Present Illness Pt s/p R THR    PT Comments    POD #2 am session. Pt was very impulsive this morning and a little confused. Pt was able to complete one step during treatment however, her son will be here this afternoon and we will go over all of it with him.  General bed mobility comments: 50% VC's for use of bed rails and use of gait belt to assist moving L LE General transfer comment: 75% VC's for safety to use the walker and use of UE's to power up off of the bed  General stair comments: Pt required 75% VCs fpr correct technique using a walker on a step. Pt was very impulsive and c/o a lot of pain when going up   Follow Up Recommendations  Follow surgeon's recommendation for DC plan and follow-up therapies     Equipment Recommendations  Rolling walker with 5" wheels;3in1 (PT)    Recommendations for Other Services OT consult     Precautions / Restrictions Precautions Precautions: Fall Restrictions Weight Bearing Restrictions: No Other Position/Activity Restrictions: WBAT    Mobility  Bed Mobility Overal bed mobility: Modified Independent Bed Mobility: Supine to Sit Rolling: Min assist         General bed mobility comments: 50% VC's for use of bed rails and use of gait belt to assist moving L LE  Transfers Overall transfer level: Needs assistance Equipment used: Rolling walker (2 wheeled) Transfers: Sit to/from Stand Sit to Stand: Min assist         General transfer comment: 75% VC's for safety to use the walker and use of UE's to power up off of the bed  Ambulation/Gait Ambulation/Gait assistance: Min assist Gait Distance (Feet): 25 Feet Assistive device: Rolling walker (2 wheeled) Gait Pattern/deviations: Step-to pattern;Decreased step length - right;Decreased step length - left;Shuffle;Trunk flexed     General  Gait Details: 75% VC's for proper gait pattern and good posture   Stairs Stairs: Yes Stairs assistance: Min assist Stair Management: No rails;With walker Number of Stairs: 1 General stair comments: Pt required 75% VCs fpr correct technique using a walker on a step. Pt was very impulsive and c/o a lot of pain when going up   Wheelchair Mobility    Modified Rankin (Stroke Patients Only)       Balance                                            Cognition Arousal/Alertness: Awake/alert Behavior During Therapy: WFL for tasks assessed/performed Overall Cognitive Status: Within Functional Limits for tasks assessed                                 General Comments: Pt was very impulsive during transfers and ambulation      Exercises Total Joint Exercises Ankle Circles/Pumps: AROM;Both;Seated;10 reps Quad Sets: AROM;Both;10 reps;Seated Gluteal Sets: AROM;Both;10 reps Short Arc Quad: AROM;10 reps;Right Heel Slides: AAROM;Right;15 reps;Seated Hip ABduction/ADduction: AAROM;Right;Seated;10 reps Long Arc Quad: AROM;AAROM;Right;10 reps;Seated    General Comments        Pertinent Vitals/Pain Pain Assessment: 0-10 Pain Score: 6  Faces Pain Scale: Hurts even more Pain Location: R hip Pain Descriptors / Indicators: Aching;Sore;Grimacing;Discomfort  Pain Intervention(s): Monitored during session;Repositioned;Ice applied    Home Living                      Prior Function            PT Goals (current goals can now be found in the care plan section) Progress towards PT goals: Progressing toward goals    Frequency    7X/week      PT Plan Current plan remains appropriate    Co-evaluation              AM-PAC PT "6 Clicks" Mobility   Outcome Measure  Help needed turning from your back to your side while in a flat bed without using bedrails?: A Little Help needed moving from lying on your back to sitting on the side of a  flat bed without using bedrails?: A Little Help needed moving to and from a bed to a chair (including a wheelchair)?: A Little Help needed standing up from a chair using your arms (e.g., wheelchair or bedside chair)?: A Little Help needed to walk in hospital room?: A Little Help needed climbing 3-5 steps with a railing? : A Little 6 Click Score: 18    End of Session Equipment Utilized During Treatment: Gait belt Activity Tolerance: Patient tolerated treatment well Patient left: in chair;with call bell/phone within reach;with chair alarm set Nurse Communication: Mobility status PT Visit Diagnosis: Difficulty in walking, not elsewhere classified (R26.2)     Time: 0935-1000 PT Time Calculation (min) (ACUTE ONLY): 25 min  Charges:  $Gait Training: 8-22 mins $Therapeutic Exercise: 8-22 mins                     Excell Seltzer, [facility]

## 2019-07-28 NOTE — Discharge Summary (Signed)
Physician Discharge Summary   Patient ID: Elaine Howell MRN: [id number] DOB/AGE: January 20, 1946 73 y.o.  Admit date: 07/23/2019 Discharge date: 07/25/2019  Primary Diagnosis: Primary osteoarthritis left hip   Admission Diagnoses:  Past Medical History:  Diagnosis Date  . Chronic insomnia   . Depression   . Heavy alcohol use 07/27/2010   Qualifier: Diagnosis of  By: Fabian Sharp MD, Neta Mends Daily use     . Hyperlipidemia   . Hypertension   . INSOMNIA UNSPECIFIED 10/29/2007   Qualifier: Diagnosis of  By: Fabian Sharp MD, Neta Mends   . LIVER FUNCTION TESTS, ABNORMAL 12/21/2008   Qualifier: Diagnosis of  By: Fabian Sharp MD, Neta Mends   . Thyroiditis    positive antibodies   Discharge Diagnoses:   Principal Problem:   OA (osteoarthritis) of hip  Estimated body mass index is 30.65 kg/m as calculated from the following:   Height as of this encounter:  (1.6 m).   Weight as of this encounter: 78.5 kg.  Procedure(s) (LRB): TOTAL HIP ARTHROPLASTY ANTERIOR APPROACH (Left)   Consults: None  HPI: Elaine Howell, 73 y.o. female, has a history of pain and functional disability in the left hip(s) due to arthritis and patient has failed non-surgical conservative treatments for greater than 12 weeks to include NSAID's and/or analgesics, corticosteriod injections, flexibility and strengthening excercises, use of assistive devices and activity modification.  Onset of symptoms was gradual starting 2 years ago with gradually worsening course since that time.The patient noted no past surgery on the left hip(s).  Patient currently rates pain in the left hip at 8 out of 10 with activity. Patient has night pain, worsening of pain with activity and weight bearing, pain that interfers with activities of daily living and pain with passive range of motion. Patient has evidence of subchondral cysts, subchondral sclerosis, periarticular osteophytes and joint space narrowing by imaging studies. This condition  presents safety issues increasing the risk of falls. There is no current active infection.  Laboratory Data: Admission on 07/23/2019, Discharged on 07/25/2019  Component Date Value Ref Range Status  . WBC 07/24/2019 7.0  4.0 - 10.5 K/uL Final  . RBC 07/24/2019 3.36* 3.87 - 5.11 MIL/uL Final  . Hemoglobin 07/24/2019 10.5* 12.0 - 15.0 g/dL Final  . HCT 40/98/1191 33.0* 36.0 - 46.0 % Final  . MCV 07/24/2019 98.2  80.0 - 100.0 fL Final  . MCH 07/24/2019 31.3  26.0 - 34.0 pg Final  . MCHC 07/24/2019 31.8  30.0 - 36.0 g/dL Final  . RDW 47/82/9562 13.1  11.5 - 15.5 % Final  . Platelets 07/24/2019 354  150 - 400 K/uL Final  . nRBC 07/24/2019 0.0  0.0 - 0.2 % Final   Performed at [facility], [address] [place], [place] [address]  . Sodium 07/24/2019 136  135 - 145 mmol/L Final  . Potassium 07/24/2019 4.3  3.5 - 5.1 mmol/L Final  . Chloride 07/24/2019 105  98 - 111 mmol/L Final  . CO2 07/24/2019 23  22 - 32 mmol/L Final  . Glucose, Bld 07/24/2019 136* 70 - 99 mg/dL Final  . BUN 57/84/6962 13  8 - 23 mg/dL Final  . Creatinine, Ser 07/24/2019 0.57  0.44 - 1.00 mg/dL Final  . Calcium 95/28/4132 9.1  8.9 - 10.3 mg/dL Final  . GFR calc non Af Amer 07/24/2019 >60  >60 mL/min Final  . GFR calc Af Amer 07/24/2019 >60  >60 mL/min Final  . Anion gap 07/24/2019 8  5 - 15 Final   Performed at [facility], [address] [place], [place] [address]  . WBC 07/25/2019 8.9  4.0 - 10.5 K/uL Final  . RBC 07/25/2019 3.22* 3.87 - 5.11 MIL/uL Final  . Hemoglobin 07/25/2019 10.0* 12.0 - 15.0 g/dL Final  . HCT 60/45/4098 31.2* 36.0 - 46.0 % Final  . MCV 07/25/2019 96.9  80.0 - 100.0 fL Final  . MCH 07/25/2019 31.1  26.0 - 34.0 pg Final  . MCHC 07/25/2019 32.1  30.0 - 36.0 g/dL Final  . RDW 11/91/4782 13.2  11.5 - 15.5 % Final  . Platelets 07/25/2019 385  150 - 400 K/uL Final  . nRBC 07/25/2019 0.0  0.0 - 0.2 % Final   Performed at [facility],  [address], [place], [place] [address]  . Sodium 07/25/2019 136  135 - 145 mmol/L Final  . Potassium 07/25/2019 3.9  3.5 - 5.1 mmol/L Final  . Chloride 07/25/2019 104  98 - 111 mmol/L Final  . CO2 07/25/2019 24  22 - 32 mmol/L Final  . Glucose, Bld 07/25/2019 123* 70 - 99 mg/dL Final  . BUN 30/86/5784 14  8 - 23 mg/dL Final  . Creatinine, Ser 07/25/2019 0.53  0.44 - 1.00 mg/dL Final  . Calcium 69/62/9528 9.1  8.9 - 10.3 mg/dL Final  . GFR calc non Af Amer 07/25/2019 >60  >60 mL/min Final  . GFR calc Af Amer 07/25/2019 >60  >60 mL/min Final  . Anion gap 07/25/2019 8  5 - 15 Final   Performed at [facility], [address], [place], [place] [address]  Appointment on 07/21/2019  Component Date Value Ref Range Status  . MICRO NUMBER: 07/21/2019 [id number]   Final  . SPECIMEN QUALITY: 07/21/2019 Adequate   Final  . Sample Source 07/21/2019 URINE   Final  . STATUS: 07/21/2019 FINAL   Final  . ISOLATE 1: 07/21/2019 Escherichia coli*  Final   Greater than 100,000 CFU/mL of Escherichia coli  Hospital Outpatient Visit on 07/19/2019  Component Date Value Ref Range Status  . SARS-CoV-2, NAA 07/19/2019 NOT DETECTED  NOT DETECTED Final   Comment: (NOTE) This nucleic acid amplification test was developed and its performance characteristics determined by [employer]. Nucleic acid amplification tests include PCR and TMA. This test has not been FDA cleared or approved. This test has been authorized by FDA under an Emergency Use Authorization (EUA). This test is only authorized for the duration of time the declaration that circumstances exist justifying the authorization of the emergency use of in vitro diagnostic tests for detection of SARS-CoV-2 virus and/or diagnosis of COVID-19 infection under section 564(b)(1) of the Act, 21 U.S.C. 366YQI-3(K) (1), unless the authorization is terminated or revoked sooner. When diagnostic testing is negative, the possibility of a  false negative result should be considered in the context of a patient's recent exposures and the presence of clinical signs and symptoms consistent with COVID-19. An individual without symptoms of COVID- 19 and who is not shedding SARS-CoV-2 vi                          rus would expect to have a negative (not detected) result in this assay. Performed At: [facility] [address] [place], [place] [id number] Jolene Schimke MD VF:[id number]   . Coronavirus Source 07/19/2019 NASOPHARYNGEAL   Final   Performed at [facility], [address], [place], [place]  [address]  Hospital Outpatient Visit on 07/17/2019  Component Date Value Ref Range Status  . aPTT 07/17/2019 37* 24 - 36 seconds Final   Comment:        IF BASELINE aPTT IS ELEVATED, SUGGEST PATIENT RISK ASSESSMENT BE USED TO DETERMINE APPROPRIATE ANTICOAGULANT THERAPY. Performed at [facility], [address], [place], [place] [address]   . Sodium 07/17/2019 140  135 - 145 mmol/L Final  . Potassium 07/17/2019 3.9  3.5 - 5.1 mmol/L Final  . Chloride 07/17/2019 104  98 - 111 mmol/L Final  . CO2 07/17/2019 24  22 - 32 mmol/L Final  . Glucose, Bld 07/17/2019 96  70 - 99 mg/dL Final  . BUN 09/81/1914 18  8 - 23 mg/dL Final  . Creatinine, Ser 07/17/2019 0.63  0.44 - 1.00 mg/dL Final  . Calcium 78/29/5621 10.1  8.9 - 10.3 mg/dL Final  . Total Protein 07/17/2019 7.6  6.5 - 8.1 g/dL Final  . Albumin 30/86/5784 4.6  3.5 - 5.0 g/dL Final  . AST 69/62/9528 21  15 - 41 U/L Final  . ALT 07/17/2019 27  0 - 44 U/L Final  . Alkaline Phosphatase 07/17/2019 97  38 - 126 U/L Final  . Total Bilirubin 07/17/2019 0.7  0.3 - 1.2 mg/dL Final  . GFR calc non Af Amer 07/17/2019 >60  >60 mL/min Final  . GFR calc Af Amer 07/17/2019 >60  >60 mL/min Final  . Anion gap 07/17/2019 12  5 - 15 Final   Performed at [facility], [address] [place], [place] [address]  . Prothrombin Time 07/17/2019  11.4  11.4 - 15.2 seconds Final  . INR 07/17/2019 0.8  0.8 - 1.2 Final   Comment: (NOTE) INR goal varies based on device and disease states. Performed at [facility], [address] [place], [place] [address]   . ABO/RH(D) 07/17/2019 O POS   Final  . Antibody Screen 07/17/2019 NEG   Final  . Sample Expiration 07/17/2019 07/26/2019,2359   Final  . Extend sample reason 07/17/2019    Final                   Value:NO TRANSFUSIONS OR PREGNANCY IN THE PAST 3 MONTHS Performed at [facility], [address], [place], [place] [address]   . MRSA, PCR 07/17/2019 NEGATIVE  NEGATIVE Final  . Staphylococcus aureus 07/17/2019 NEGATIVE  NEGATIVE Final   Comment: (NOTE) The Xpert SA Assay (FDA approved for NASAL specimens in patients 69 years of age and older), is one component of a comprehensive surveillance program. It is not intended to diagnose infection nor to guide or monitor treatment. Performed at [facility], [address] [place], [place] [address]   . ABO/RH(D) 07/17/2019    Final                   Value:O POS Performed at [facility], [address], [place], [place] [address]   Office Visit on 07/15/2019  Component Date Value Ref Range Status  . WBC 07/15/2019 4.6  4.0 - 10.5 K/uL Final  . RBC 07/15/2019 4.12  3.87 - 5.11 Mil/uL Final  . Hemoglobin 07/15/2019 12.9  12.0 - 15.0 g/dL Final  . HCT 95/63/8756 39.0  36.0 - 46.0 % Final  . MCV 07/15/2019 94.6  78.0 - 100.0 fl Final  . MCHC 07/15/2019 33.1  30.0 - 36.0 g/dL Final  . RDW 43/32/9518  13.7  11.5 - 15.5 % Final  . Platelets 07/15/2019 462.0* 150.0 - 400.0 K/uL Final  . Neutrophils Relative % 07/15/2019 53.5  43.0 - 77.0 % Final  . Lymphocytes Relative 07/15/2019 31.8  12.0 - 46.0 % Final  . Monocytes Relative 07/15/2019 9.3  3.0 - 12.0 % Final  . Eosinophils Relative 07/15/2019 4.6  0.0 - 5.0 % Final  . Basophils Relative 07/15/2019 0.8  0.0  - 3.0 % Final  . Neutro Abs 07/15/2019 2.5  1.4 - 7.7 K/uL Final  . Lymphs Abs 07/15/2019 1.5  0.7 - 4.0 K/uL Final  . Monocytes Absolute 07/15/2019 0.4  0.1 - 1.0 K/uL Final  . Eosinophils Absolute 07/15/2019 0.2  0.0 - 0.7 K/uL Final  . Basophils Absolute 07/15/2019 0.0  0.0 - 0.1 K/uL Final  . Sodium 07/15/2019 140  135 - 145 mEq/L Final  . Potassium 07/15/2019 4.6  3.5 - 5.1 mEq/L Final  . Chloride 07/15/2019 104  96 - 112 mEq/L Final  . CO2 07/15/2019 29  19 - 32 mEq/L Final  . Glucose, Bld 07/15/2019 103* 70 - 99 mg/dL Final  . BUN 16/10/960410/13/2020 18  6 - 23 mg/dL Final  . Creatinine, Ser 07/15/2019 0.67  0.40 - 1.20 mg/dL Final  . Calcium 54/09/811910/13/2020 10.2  8.4 - 10.5 mg/dL Final  . GFR 14/78/295610/13/2020 85.65  >60.00 mL/min Final  . Albumin 07/15/2019 4.6  3.5 - 5.2 g/dL Final  . INR 21/30/865710/13/2020 0.9  0.8 - 1.0 ratio Final  . Prothrombin Time 07/15/2019 11.0  9.6 - 13.1 sec Final  . Hgb A1c MFr Bld 07/15/2019 5.6  4.6 - 6.5 % Final   Glycemic Control Guidelines for People with Diabetes:Non Diabetic:  <6%Goal of Therapy: <7%Additional Action Suggested:  >8%   . Color, UA 07/15/2019 YELLOW   Final  . Clarity, UA 07/15/2019 CLOUDY   Final  . Glucose, UA 07/15/2019 Negative  Negative Final  . Bilirubin, UA 07/15/2019 -   Final  . Ketones, UA 07/15/2019 -   Final  . Spec Grav, UA 07/15/2019 1.020  1.010 - 1.025 Final  . Blood, UA 07/15/2019 -   Final  . pH, UA 07/15/2019 6.5  5.0 - 8.0 Final  . Protein, UA 07/15/2019 Positive* Negative Final   +- TRACE  . Urobilinogen, UA 07/15/2019 0.2  0.2 or 1.0 E.U./dL Corrected  . Nitrite, UA 07/15/2019 +   Corrected   POSITIVE  . Leukocytes, UA 07/15/2019 Negative  Negative Final     X-Rays:Dg Pelvis Portable  Result Date: 07/23/2019 CLINICAL DATA:  Status post left hip arthroplasty EXAM: PORTABLE PELVIS 1-2 VIEWS COMPARISON:  03/31/2019 FINDINGS: SI joints are non widened. Pubic symphysis and rami are intact. Left hip replacement with normal  alignment. Surgical drain and gas in the soft tissues. Shortened appearance of right femoral neck is presumably due to positioning. IMPRESSION: Status post left hip replacement with expected postsurgical changes Electronically Signed   By: Jasmine PangKim  Fujinaga M.D.   On: 07/23/2019 17:30   Dg C-arm 1-60 Min-no Report  Result Date: 07/23/2019 Fluoroscopy was utilized by the requesting physician.  No radiographic interpretation.   Dg Hip Operative Unilat W Or W/o Pelvis Left  Result Date: 07/23/2019 CLINICAL DATA:  Left hip arthroplasty EXAM: OPERATIVE left HIP (WITH PELVIS IF PERFORMED) 2 VIEWS TECHNIQUE: Fluoroscopic spot image(s) were submitted for interpretation post-operatively. COMPARISON:  03/31/2019 FINDINGS: Two low resolution intraoperative spot views of the left hip. Total fluoroscopy time was 16 seconds. The images  demonstrate a left hip replacement with normal alignment. Gas in the soft tissues consistent with recent surgery. IMPRESSION: Intraoperative fluoroscopic assistance provided during left hip replacement Electronically Signed   By: Donavan Foil M.D.   On: 07/23/2019 17:29    EKG: Orders placed or performed during the hospital encounter of 07/17/19  . EKG  . EKG     Hospital Course: Patient was admitted to [facility] and taken to the OR and underwent the above state procedure without complications.  Patient tolerated the procedure well and was later transferred to the recovery room and then to the orthopaedic floor for postoperative care.  They were given PO and IV analgesics for pain control following their surgery.  They were given 24 hours of postoperative antibiotics of  Anti-infectives (From admission, onward)   Start     Dose/Rate Route Frequency Ordered Stop   07/23/19 2130  ceFAZolin (ANCEF) IVPB 2g/100 mL premix     2 g 200 mL/hr over 30 Minutes Intravenous Every 6 hours 07/23/19 1808 07/24/19 0459   07/23/19 1500  ceFAZolin (ANCEF) IVPB 2g/100 mL premix     2  g 200 mL/hr over 30 Minutes Intravenous On call to O.R. 07/23/19 1423 07/23/19 1601   07/23/19 1424  ceFAZolin (ANCEF) 2-4 GM/100ML-% IVPB    Note to Pharmacy: Randa Evens  : cabinet override      07/23/19 1424 07/23/19 1531     and started on DVT prophylaxis in the form of Aspirin.   PT and OT were ordered for total hip protocol.  The patient was allowed to be WBAT with therapy. Discharge planning was consulted to help with postop disposition and equipment needs.  Patient had a fair night on the evening of surgery.  They started to get up OOB with therapy on day one.  Hemovac drain was pulled without difficulty.  Continued to work with therapy into day two.  Dressing was changed on day two and the incision was clean and dry.  The patient had progressed with therapy and meeting their goals.  Incision was healing well.  Patient was seen in rounds and was ready to go home.  Diet: Cardiac diet Activity:WBAT Follow-up:in 2 weeks Disposition - Home Discharged Condition: stable   Discharge Instructions    Call MD / Call 911   Complete by: As directed    If you experience chest pain or shortness of breath, CALL 911 and be transported to the hospital emergency room.  If you develope a fever above 101 F, pus (white drainage) or increased drainage or redness at the wound, or calf pain, call your surgeon's office.   Constipation Prevention   Complete by: As directed    Drink plenty of fluids.  Prune juice may be helpful.  You may use a stool softener, such as Colace (over the counter) 100 mg twice a day.  Use MiraLax (over the counter) for constipation as needed.   Diet - low sodium heart healthy   Complete by: As directed    Discharge instructions   Complete by: As directed    Dr. Gaynelle Arabian Total Joint Specialist [facility] [address], [place], [place] [address] [phone]  ANTERIOR APPROACH TOTAL HIP REPLACEMENT POSTOPERATIVE DIRECTIONS   Hip Rehabilitation,  Guidelines Following Surgery  The results of a hip operation are greatly improved after range of motion and muscle strengthening exercises. Follow all safety measures which are given to protect your hip. If any of these exercises cause  increased pain or swelling in your joint, decrease the amount until you are comfortable again. Then slowly increase the exercises. Call your caregiver if you have problems or questions.   HOME CARE INSTRUCTIONS  Remove items at home which could result in a fall. This includes throw rugs or furniture in walking pathways.  ICE to the affected hip every three hours for 30 minutes at a time and then as needed for pain and swelling.  Continue to use ice on the hip for pain and swelling from surgery. You may notice swelling that will progress down to the foot and ankle.  This is normal after surgery.  Elevate the leg when you are not up walking on it.   Continue to use the breathing machine which will help keep your temperature down.  It is common for your temperature to cycle up and down following surgery, especially at night when you are not up moving around and exerting yourself.  The breathing machine keeps your lungs expanded and your temperature down.   DIET You may resume your previous home diet once your are discharged from the hospital.  DRESSING / WOUND CARE / SHOWERING You may shower 3 days after surgery, but keep the wounds dry during showering.  You may use an occlusive plastic wrap (Press'n Seal for example), NO SOAKING/SUBMERGING IN THE BATHTUB.  If the bandage gets wet, change with a clean dry gauze.  If the incision gets wet, pat the wound dry with a clean towel. You may start showering once you are discharged home but do not submerge the incision under water. Just pat the incision dry and apply a dry gauze dressing on daily. Change the surgical dressing daily and reapply a dry dressing each time.  ACTIVITY Walk with your walker as instructed. Use walker  as long as suggested by your caregivers. Avoid periods of inactivity such as sitting longer than an hour when not asleep. This helps prevent blood clots.  You may resume a sexual relationship in one month or when given the OK by your doctor.  You may return to work once you are cleared by your doctor.  Do not drive a car for 6 weeks or until released by you surgeon.  Do not drive while taking narcotics.  WEIGHT BEARING Weight bearing as tolerated with assist device (walker, cane, etc) as directed, use it as long as suggested by your surgeon or therapist, typically at least 4-6 weeks.  POSTOPERATIVE CONSTIPATION PROTOCOL Constipation - defined medically as fewer than three stools per week and severe constipation as less than one stool per week.  One of the most common issues patients have following surgery is constipation.  Even if you have a regular bowel pattern at home, your normal regimen is likely to be disrupted due to multiple reasons following surgery.  Combination of anesthesia, postoperative narcotics, change in appetite and fluid intake all can affect your bowels.  In order to avoid complications following surgery, here are some recommendations in order to help you during your recovery period.  Colace (docusate) - Pick up an over-the-counter form of Colace or another stool softener and take twice a day as long as you are requiring postoperative pain medications.  Take with a full glass of water daily.  If you experience loose stools or diarrhea, hold the colace until you stool forms back up.  If your symptoms do not get better within 1 week or if they get worse, check with your doctor.  Dulcolax (bisacodyl) -  Pick up over-the-counter and take as directed by the product packaging as needed to assist with the movement of your bowels.  Take with a full glass of water.  Use this product as needed if not relieved by Colace only.   MiraLax (polyethylene glycol) - Pick up over-the-counter to  have on hand.  MiraLax is a solution that will increase the amount of water in your bowels to assist with bowel movements.  Take as directed and can mix with a glass of water, juice, soda, coffee, or tea.  Take if you go more than two days without a movement. Do not use MiraLax more than once per day. Call your doctor if you are still constipated or irregular after using this medication for 7 days in a row.  If you continue to have problems with postoperative constipation, please contact the office for further assistance and recommendations.  If you experience "the worst abdominal pain ever" or develop nausea or vomiting, please contact the office immediatly for further recommendations for treatment.  ITCHING  If you experience itching with your medications, try taking only a single pain pill, or even half a pain pill at a time.  You can also use Benadryl over the counter for itching or also to help with sleep.   TED HOSE STOCKINGS Wear the elastic stockings on both legs for three weeks following surgery during the day but you may remove then at night for sleeping.  MEDICATIONS See your medication summary on the "After Visit Summary" that the nursing staff will review with you prior to discharge.  You may have some home medications which will be placed on hold until you complete the course of blood thinner medication.  It is important for you to complete the blood thinner medication as prescribed by your surgeon.  Continue your approved medications as instructed at time of discharge.  PRECAUTIONS If you experience chest pain or shortness of breath - call 911 immediately for transfer to the hospital emergency department.  If you develop a fever greater that 101 F, purulent drainage from wound, increased redness or drainage from wound, foul odor from the wound/dressing, or calf pain - CONTACT YOUR SURGEON.                                                   FOLLOW-UP APPOINTMENTS Make sure you keep  all of your appointments after your operation with your surgeon and caregivers. You should call the office at the above phone number and make an appointment for approximately two weeks after the date of your surgery or on the date instructed by your surgeon outlined in the "After Visit Summary".  RANGE OF MOTION AND STRENGTHENING EXERCISES  These exercises are designed to help you keep full movement of your hip joint. Follow your caregiver's or physical therapist's instructions. Perform all exercises about fifteen times, three times per day or as directed. Exercise both hips, even if you have had only one joint replacement. These exercises can be done on a training (exercise) mat, on the floor, on a table or on a bed. Use whatever works the best and is most comfortable for you. Use music or television while you are exercising so that the exercises are a pleasant break in your day. This will make your life better with the exercises acting as a break in routine  you can look forward to.  Lying on your back, slowly slide your foot toward your buttocks, raising your knee up off the floor. Then slowly slide your foot back down until your leg is straight again.  Lying on your back spread your legs as far apart as you can without causing discomfort.  Lying on your side, raise your upper leg and foot straight up from the floor as far as is comfortable. Slowly lower the leg and repeat.  Lying on your back, tighten up the muscle in the front of your thigh (quadriceps muscles). You can do this by keeping your leg straight and trying to raise your heel off the floor. This helps strengthen the largest muscle supporting your knee.  Lying on your back, tighten up the muscles of your buttocks both with the legs straight and with the knee bent at a comfortable angle while keeping your heel on the floor.   IF YOU ARE TRANSFERRED TO A SKILLED REHAB FACILITY If the patient is transferred to a skilled rehab facility following  release from the hospital, a list of the current medications will be sent to the facility for the patient to continue.  When discharged from the skilled rehab facility, please have the facility set up the patient's Home Health Physical Therapy prior to being released. Also, the skilled facility will be responsible for providing the patient with their medications at time of release from the facility to include their pain medication, the muscle relaxants, and their blood thinner medication. If the patient is still at the rehab facility at time of the two week follow up appointment, the skilled rehab facility will also need to assist the patient in arranging follow up appointment in our office and any transportation needs.  MAKE SURE YOU:  Understand these instructions.  Get help right away if you are not doing well or get worse.    Pick up stool softner and laxative for home use following surgery while on pain medications. Do not submerge incision under water. Please use good hand washing techniques while changing dressing each day. May shower starting three days after surgery. Please use a clean towel to pat the incision dry following showers. Continue to use ice for pain and swelling after surgery. Do not use any lotions or creams on the incision until instructed by your surgeon.   Increase activity slowly as tolerated   Complete by: As directed      Allergies as of 07/25/2019      Reactions   Lisinopril Cough      Medication List    STOP taking these medications   meloxicam 15 MG tablet Commonly known as: MOBIC     TAKE these medications   acetaminophen 650 MG CR tablet Commonly known as: TYLENOL Take 1,300 mg by mouth every 8 (eight) hours as needed for pain.   amLODipine 10 MG tablet Commonly known as: NORVASC TAKE 1 TABLET BY MOUTH DAILY What changed: when to take this   aspirin 325 MG EC tablet Take 1 tablet (325 mg total) by mouth 2 (two) times daily.   buPROPion 300  MG 24 hr tablet Commonly known as: WELLBUTRIN XL TAKE 1 TABLET BY MOUTH EVERY MORNING What changed: when to take this   diclofenac 50 MG tablet Commonly known as: CATAFLAM Take 50 mg by mouth 2 (two) times daily as needed for pain.   DULoxetine 20 MG capsule Commonly known as: Cymbalta Take 1 capsule (20 mg total) by mouth daily. Decrease  to every other day and wean as tolerated What changed: when to take this   HYDROcodone-acetaminophen 5-325 MG tablet Commonly known as: NORCO/VICODIN Take 1-2 tablets by mouth every 6 (six) hours as needed for moderate pain (pain score 4-6). What changed:   how much to take  when to take this  reasons to take this   levothyroxine 88 MCG tablet Commonly known as: SYNTHROID TAKE 1 TABLET BY MOUTH DAILY What changed: when to take this   losartan 100 MG tablet Commonly known as: COZAAR TAKE 1 TABLET BY MOUTH DAILY What changed: when to take this   methocarbamol 500 MG tablet Commonly known as: ROBAXIN Take 1 tablet (500 mg total) by mouth every 6 (six) hours as needed for muscle spasms.   rosuvastatin 10 MG tablet Commonly known as: CRESTOR TAKE 1 TABLET BY MOUTH DAILY What changed: when to take this   ZzzQuil 50 MG/30ML Liqd Generic drug: diphenhydrAMINE HCl Take 15-30 mLs by mouth at bedtime.      Follow-up Information    Ollen Gross, MD. Schedule an appointment as soon as possible for a visit on 08/07/2019.   Specialty: Orthopedic Surgery Contact information: [address] [place] [address] [place] [address] [phone]           Signed: Dimitri Ped, PA-C Orthopaedic Surgery 07/28/2019, 7:48 AM

## 2019-08-06 ENCOUNTER — Ambulatory Visit: Payer: Medicare Other | Admitting: Internal Medicine

## 2019-08-06 ENCOUNTER — Other Ambulatory Visit: Payer: Self-pay | Admitting: Internal Medicine

## 2019-08-26 DIAGNOSIS — Z96642 Presence of left artificial hip joint: Secondary | ICD-10-CM | POA: Diagnosis not present

## 2019-09-09 ENCOUNTER — Other Ambulatory Visit: Payer: Self-pay | Admitting: Internal Medicine

## 2019-09-09 DIAGNOSIS — Z1231 Encounter for screening mammogram for malignant neoplasm of breast: Secondary | ICD-10-CM

## 2019-09-27 ENCOUNTER — Other Ambulatory Visit: Payer: Self-pay | Admitting: Internal Medicine

## 2019-10-16 DIAGNOSIS — K5900 Constipation, unspecified: Secondary | ICD-10-CM | POA: Diagnosis not present

## 2019-10-29 ENCOUNTER — Ambulatory Visit: Payer: Medicare Other

## 2019-12-01 ENCOUNTER — Ambulatory Visit
Admission: RE | Admit: 2019-12-01 | Discharge: 2019-12-01 | Disposition: A | Discharge status: Home / Self Care | Payer: Medicare Other | Source: Ambulatory Visit | Attending: Internal Medicine | Admitting: Internal Medicine

## 2019-12-01 ENCOUNTER — Other Ambulatory Visit: Payer: Self-pay

## 2019-12-01 DIAGNOSIS — Z1231 Encounter for screening mammogram for malignant neoplasm of breast: Secondary | ICD-10-CM

## 2019-12-18 ENCOUNTER — Other Ambulatory Visit: Payer: Self-pay | Admitting: Internal Medicine

## 2019-12-31 ENCOUNTER — Other Ambulatory Visit: Payer: Self-pay | Admitting: Internal Medicine

## 2020-01-01 ENCOUNTER — Other Ambulatory Visit: Payer: Self-pay | Admitting: Internal Medicine

## 2020-01-22 DIAGNOSIS — M545 Low back pain: Secondary | ICD-10-CM | POA: Diagnosis not present

## 2020-01-22 DIAGNOSIS — M5136 Other intervertebral disc degeneration, lumbar region: Secondary | ICD-10-CM | POA: Diagnosis not present

## 2020-01-30 ENCOUNTER — Other Ambulatory Visit: Payer: Self-pay | Admitting: Internal Medicine

## 2020-02-04 DIAGNOSIS — M545 Low back pain: Secondary | ICD-10-CM | POA: Diagnosis not present

## 2020-02-11 DIAGNOSIS — G8929 Other chronic pain: Secondary | ICD-10-CM | POA: Diagnosis not present

## 2020-02-13 DIAGNOSIS — G8929 Other chronic pain: Secondary | ICD-10-CM | POA: Diagnosis not present

## 2020-02-25 DIAGNOSIS — G8929 Other chronic pain: Secondary | ICD-10-CM | POA: Diagnosis not present

## 2020-02-27 DIAGNOSIS — G8929 Other chronic pain: Secondary | ICD-10-CM | POA: Diagnosis not present

## 2020-02-29 ENCOUNTER — Other Ambulatory Visit: Payer: Self-pay | Admitting: Internal Medicine

## 2020-03-30 ENCOUNTER — Other Ambulatory Visit: Payer: Self-pay | Admitting: Internal Medicine

## 2020-04-29 ENCOUNTER — Other Ambulatory Visit: Payer: Self-pay | Admitting: Internal Medicine

## 2020-05-08 ENCOUNTER — Other Ambulatory Visit: Payer: Self-pay | Admitting: Internal Medicine

## 2020-05-29 ENCOUNTER — Other Ambulatory Visit: Payer: Self-pay | Admitting: Internal Medicine

## 2020-06-10 ENCOUNTER — Other Ambulatory Visit: Payer: Self-pay | Admitting: Internal Medicine

## 2020-06-11 ENCOUNTER — Other Ambulatory Visit: Payer: Self-pay | Admitting: Internal Medicine

## 2020-06-11 MED ORDER — LEVOTHYROXINE SODIUM 88 MCG PO TABS: 88.0000 ug | ORAL_TABLET | Freq: Every day | ORAL | 0 refills | Status: DC

## 2020-06-12 ENCOUNTER — Other Ambulatory Visit: Payer: Self-pay | Admitting: Internal Medicine

## 2020-06-16 ENCOUNTER — Encounter: Payer: Self-pay | Admitting: Internal Medicine

## 2020-06-16 ENCOUNTER — Other Ambulatory Visit: Payer: Self-pay

## 2020-06-16 ENCOUNTER — Ambulatory Visit (INDEPENDENT_AMBULATORY_CARE_PROVIDER_SITE_OTHER): Payer: Medicare Other | Admitting: Internal Medicine

## 2020-06-16 VITALS — BP 124/64 | HR 86 | Temp 97.7°F | Ht 64.0 in | Wt 174.6 lb

## 2020-06-16 DIAGNOSIS — E039 Hypothyroidism, unspecified: Secondary | ICD-10-CM | POA: Diagnosis not present

## 2020-06-16 DIAGNOSIS — Z1211 Encounter for screening for malignant neoplasm of colon: Secondary | ICD-10-CM

## 2020-06-16 DIAGNOSIS — R739 Hyperglycemia, unspecified: Secondary | ICD-10-CM | POA: Diagnosis not present

## 2020-06-16 DIAGNOSIS — Z79899 Other long term (current) drug therapy: Secondary | ICD-10-CM

## 2020-06-16 DIAGNOSIS — I10 Essential (primary) hypertension: Secondary | ICD-10-CM | POA: Diagnosis not present

## 2020-06-16 DIAGNOSIS — Z23 Encounter for immunization: Secondary | ICD-10-CM | POA: Diagnosis not present

## 2020-06-16 DIAGNOSIS — E785 Hyperlipidemia, unspecified: Secondary | ICD-10-CM | POA: Diagnosis not present

## 2020-06-16 DIAGNOSIS — Z974 Presence of external hearing-aid: Secondary | ICD-10-CM

## 2020-06-16 NOTE — Patient Instructions (Signed)
Flu vaccine today  Will notify you  of labs when available.  Can get shingrix at pharmacy in  2 weeks or so   Repeat in 2-6 months .   Any booster  advised can be done  2 weeks or after as indicated.  Have pharmacy contact us for   Refills  Will order cologuard for you.   Activity as possible .  Eating healthy  As possible

## 2020-06-16 NOTE — Progress Notes (Signed)
Chief Complaint  Patient presents with  . Medication Refill    Doing okay    HPI: Elaine Howell 74 y.o. come in for Chronic disease management   Delayed for lab  doing ok    Thyroid  Taking med missed  5 days total  HLD :taking  Med no se reported   BP taking amlodipine.  and losartan    Not checking readings at this time but nose    Had hip replacement surgery  2020 and has had back pain got PT  Still a bother    depression seems to be doing ok  At this time on Wellbutrin and  Low dose Cymbalta   Has ? About vaccines   When  And timiing   Colon screen interested in cologuard  No sx of concern  ROS: See pertinent positives and negatives per HPI. No cp sob  Fall  Hearing aids  Forgot to wear today  Past Medical History:  Diagnosis Date  . Chronic insomnia   . Depression   . Heavy alcohol use 07/27/2010   Qualifier: Diagnosis of  By: Fabian Sharp MD, Neta Mends Daily use     . Hyperlipidemia   . Hypertension   . INSOMNIA UNSPECIFIED 10/29/2007   Qualifier: Diagnosis of  By: Fabian Sharp MD, Neta Mends   . LIVER FUNCTION TESTS, ABNORMAL 12/21/2008   Qualifier: Diagnosis of  By: Fabian Sharp MD, Neta Mends   . Thyroiditis    positive antibodies    Family History  Problem Relation Age of Onset  . Heart disease Mother   . Alcohol abuse Father   . Heart disease Father   . Hypertension Sister     Social History   Socioeconomic History  . Marital status: Married    Spouse name: Not on file  . Number of children: Not on file  . Years of education: Not on file  . Highest education level: Not on file  Occupational History  . Not on file  Tobacco Use  . Smoking status: Former Smoker    Start date: 07/24/1964    Quit date: 07/25/1999    Years since quitting: 20.9  . Smokeless tobacco: Never Used  Vaping Use  . Vaping Use: Never used  Substance and Sexual Activity  . Alcohol use: Yes    Alcohol/week: 14.0 standard drinks    Types: 14 Glasses of wine per week    Comment: heavy  alcohol use,  "its been years since ive gone a day without alcholol but i didnt feel sick when i wasnt drinking"   . Drug use: No  . Sexual activity: Not on file  Other Topics Concern  . Not on file  Social History Narrative      hh of 1   Part time [employer] english as a second language    Now retired    [facility] of 1 no pets   G2P2   Social Determinants of Corporate investment banker Strain:   . Difficulty of Paying Living Expenses: Not on file  Food Insecurity:   . Worried About Programme researcher, broadcasting/film/video in the Last Year: Not on file  . Ran Out of Food in the Last Year: Not on file  Transportation Needs:   . Lack of Transportation (Medical): Not on file  . Lack of Transportation (Non-Medical): Not on file  Physical Activity:   . Days of Exercise per Week: Not on file  . Minutes of Exercise per Session: Not on  file  Stress:   . Feeling of Stress : Not on file  Social Connections:   . Frequency of Communication with Friends and Family: Not on file  . Frequency of Social Gatherings with Friends and Family: Not on file  . Attends Religious Services: Not on file  . Active Member of Clubs or Organizations: Not on file  . Attends Banker Meetings: Not on file  . Marital Status: Not on file    Outpatient Medications Prior to Visit  Medication Sig Dispense Refill  . acetaminophen (TYLENOL) 650 MG CR tablet Take 1,300 mg by mouth every 8 (eight) hours as needed for pain.    Marland Kitchen amLODipine (NORVASC) 10 MG tablet Take 1 tablet (10 mg total) by mouth daily. Patient will need a visit for further refills 90 tablet 0  . amoxicillin (AMOXIL) 500 MG tablet amoxicillin 500 mg tablet  TAKE 4 TABLETS BY MOUTH 1 HOUR BEFORE DENTAL APPOINTMENT    . aspirin EC 325 MG EC tablet Take 1 tablet (325 mg total) by mouth 2 (two) times daily. 40 tablet 0  . buPROPion (WELLBUTRIN XL) 300 MG 24 hr tablet Take 1 tablet (300 mg total) by mouth every morning. Please schedule visit for further refills.  [phone] 30 tablet 0  . diphenhydrAMINE HCl (ZZZQUIL) 50 MG/30ML LIQD Take 15-30 mLs by mouth at bedtime.    . DULoxetine (CYMBALTA) 20 MG capsule TAKE ONE CAPSULE BY MOUTH DAILY. DECREASE TO EVERY OTHER DAY AND WEAN AS TOLERATED 30 capsule 2  . levothyroxine (SYNTHROID) 88 MCG tablet Take 1 tablet (88 mcg total) by mouth daily. Please schedule your yearly visit with labs for refills. [phone] 30 tablet 0  . losartan (COZAAR) 100 MG tablet Take 1 tablet (100 mg total) by mouth daily. Please schedule an office visit for further refills. [phone] 30 tablet 0  . rosuvastatin (CRESTOR) 10 MG tablet Take 1 tablet (10 mg total) by mouth daily. Please schedule an office visit for further refills. [phone] 30 tablet 0  . diclofenac (CATAFLAM) 50 MG tablet Take 50 mg by mouth 2 (two) times daily as needed for pain.    Marland Kitchen HYDROcodone-acetaminophen (NORCO/VICODIN) 5-325 MG tablet Take 1-2 tablets by mouth every 6 (six) hours as needed for moderate pain (pain score 4-6). 56 tablet 0  . methocarbamol (ROBAXIN) 500 MG tablet Take 1 tablet (500 mg total) by mouth every 6 (six) hours as needed for muscle spasms. 40 tablet 0   No facility-administered medications prior to visit.     EXAM:  BP 124/64   Pulse 86   Temp 97.7 F (36.5 C) (Oral)   Ht 5\' 4"  (1.626 m)   Wt 174 lb 9.6 oz (79.2 kg)   SpO2 96%   BMI 29.97 kg/m   Body mass index is 29.97 kg/m.  GENERAL: vitals reviewed and listed above, alert, oriented, appears well hydrated and in no acute distress HEENT: atraumatic, conjunctiva  clear, no obvious abnormalities on inspection of external nose and ears OP : masked  NECK: no obvious masses on inspection palpation  LUNGS: clear to auscultation bilaterally, no wheezes, rales or rhonchi, good air movement CV: HRRR, no clubbing cyanosis or  peripheral edema puffy feet  nl cap refill  MS: moves all extremities without noticeable focal  abnormality PSYCH: pleasant and cooperative, no  obvious depression or anxiety Lab Results  Component Value Date   WBC 8.9 07/25/2019   HGB 10.0 (L) 07/25/2019   HCT 31.2 (L) 07/25/2019  PLT 385 07/25/2019   GLUCOSE 123 (H) 07/25/2019   CHOL 221 (H) 03/04/2019   TRIG 69.0 03/04/2019   HDL 105.80 03/04/2019   LDLDIRECT 146.3 09/17/2013   LDLCALC 101 (H) 03/04/2019   ALT 27 07/17/2019   AST 21 07/17/2019   NA 136 07/25/2019   K 3.9 07/25/2019   CL 104 07/25/2019   CREATININE 0.53 07/25/2019   BUN 14 07/25/2019   CO2 24 07/25/2019   TSH 1.95 03/04/2019   INR 0.8 07/17/2019   HGBA1C 5.6 07/15/2019   BP Readings from Last 3 Encounters:  06/16/20 124/64  07/25/19 140/80  07/17/19 140/72    ASSESSMENT AND PLAN:  Discussed the following assessment and plan:  Hypothyroidism, unspecified type - Plan: TSH, Hepatic function panel, Lipid panel, BASIC METABOLIC PANEL WITH GFR, CBC with Differential/Platelet, Hemoglobin A1c, Hemoglobin A1c, CBC with Differential/Platelet, BASIC METABOLIC PANEL WITH GFR, Lipid panel, Hepatic function panel, TSH  Essential hypertension - Plan: TSH, Hepatic function panel, Lipid panel, BASIC METABOLIC PANEL WITH GFR, CBC with Differential/Platelet, Hemoglobin A1c, Hemoglobin A1c, CBC with Differential/Platelet, BASIC METABOLIC PANEL WITH GFR, Lipid panel, Hepatic function panel, TSH  Medication management - Plan: TSH, Hepatic function panel, Lipid panel, BASIC METABOLIC PANEL WITH GFR, CBC with Differential/Platelet, Hemoglobin A1c, Hemoglobin A1c, CBC with Differential/Platelet, BASIC METABOLIC PANEL WITH GFR, Lipid panel, Hepatic function panel, TSH  Hyperlipidemia, unspecified hyperlipidemia type - Plan: TSH, Hepatic function panel, Lipid panel, BASIC METABOLIC PANEL WITH GFR, CBC with Differential/Platelet, Hemoglobin A1c, Hemoglobin A1c, CBC with Differential/Platelet, BASIC METABOLIC PANEL WITH GFR, Lipid panel, Hepatic function panel, TSH  Hyperglycemia - Plan: TSH, Hepatic function panel, Lipid  panel, BASIC METABOLIC PANEL WITH GFR, CBC with Differential/Platelet, Hemoglobin A1c, Hemoglobin A1c, CBC with Differential/Platelet, BASIC METABOLIC PANEL WITH GFR, Lipid panel, Hepatic function panel, TSH  Need for influenza vaccination - Plan: Flu Vaccine QUAD High Dose(Fluad)  Colon cancer screening - Plan: Cologuard  Wears hearing aid Anemia  Recheck  Post op from  last year and other lab monitoring  In pt for back pain Disc immunizations  meds    Plan cpx in jan feb 22  Or as needed in interim  Continue same meds  -Patient advised to return or notify health care team  if  new concerns arise.  Patient Instructions  Flu vaccine today  Will notify you  of labs when available.  Can get shingrix at pharmacy in  2 weeks or so   Repeat in 2-6 months .   Any booster  advised can be done  2 weeks or after as indicated.  Have pharmacy contact us for   Refills  Will order cologuard for you.   Activity as possible .  Eating healthy  As possible   Burna Mortimer K. Chauntay Paszkiewicz M.D.

## 2020-06-17 LAB — HEMOGLOBIN A1C
Hgb A1c MFr Bld: 5.3 % of total Hgb (ref <5.7)
Mean Plasma Glucose: 105 (calc)
eAG (mmol/L): 5.8 (calc)

## 2020-06-17 LAB — LIPID PANEL
Cholesterol: 238 mg/dL — ABNORMAL HIGH (ref <200)
HDL: 107 mg/dL (ref >=50)
LDL Cholesterol (Calc): 114 mg/dL (calc) — ABNORMAL HIGH
Non-HDL Cholesterol (Calc): 131 mg/dL (calc) — ABNORMAL HIGH (ref <130)
Total CHOL/HDL Ratio: 2.2 (calc) (ref <5.0)
Triglycerides: 71 mg/dL (ref <150)

## 2020-06-17 LAB — HEPATIC FUNCTION PANEL
AG Ratio: 1.8 (calc) (ref 1.0–2.5)
ALT: 25 U/L (ref 6–29)
AST: 22 U/L (ref 10–35)
Albumin: 4.8 g/dL (ref 3.6–5.1)
Alkaline phosphatase (APISO): 86 U/L (ref 37–153)
Bilirubin, Direct: 0.1 mg/dL (ref 0.0–0.2)
Globulin: 2.6 g/dL (calc) (ref 1.9–3.7)
Indirect Bilirubin: 0.5 mg/dL (calc) (ref 0.2–1.2)
Total Bilirubin: 0.6 mg/dL (ref 0.2–1.2)
Total Protein: 7.4 g/dL (ref 6.1–8.1)

## 2020-06-17 LAB — CBC WITH DIFFERENTIAL/PLATELET
Absolute Monocytes: 485 cells/uL (ref 200–950)
Basophils Absolute: 49 cells/uL (ref 0–200)
Basophils Relative: 1 %
Eosinophils Absolute: 132 cells/uL (ref 15–500)
Eosinophils Relative: 2.7 %
HCT: 40.3 % (ref 35.0–45.0)
Hemoglobin: 13.4 g/dL (ref 11.7–15.5)
Lymphs Abs: 1676 cells/uL (ref 850–3900)
MCH: 31.3 pg (ref 27.0–33.0)
MCHC: 33.3 g/dL (ref 32.0–36.0)
MCV: 94.2 fL (ref 80.0–100.0)
MPV: 9.9 fL (ref 7.5–12.5)
Monocytes Relative: 9.9 %
Neutro Abs: 2558 cells/uL (ref 1500–7800)
Neutrophils Relative %: 52.2 %
Platelets: 378 10*3/uL (ref 140–400)
RBC: 4.28 10*6/uL (ref 3.80–5.10)
RDW: 13.2 % (ref 11.0–15.0)
Total Lymphocyte: 34.2 %
WBC: 4.9 10*3/uL (ref 3.8–10.8)

## 2020-06-17 LAB — BASIC METABOLIC PANEL WITH GFR
BUN: 13 mg/dL (ref 7–25)
CO2: 26 mmol/L (ref 20–32)
Calcium: 10.4 mg/dL (ref 8.6–10.4)
Chloride: 104 mmol/L (ref 98–110)
Creat: 0.77 mg/dL (ref 0.60–0.93)
GFR, Est African American: 87 mL/min/{1.73_m2} (ref >=60)
GFR, Est Non African American: 75 mL/min/{1.73_m2} (ref >=60)
Glucose, Bld: 89 mg/dL (ref 65–99)
Potassium: 4.6 mmol/L (ref 3.5–5.3)
Sodium: 140 mmol/L (ref 135–146)

## 2020-06-17 LAB — TSH: TSH: 1.56 mIU/L (ref 0.40–4.50)

## 2020-06-17 NOTE — Progress Notes (Signed)
Thyroid level in range and  rest of lab normal x cholesterol up some from last year   stay on medication  and work on healthy eating  losing a few pounds  should help

## 2020-07-03 ENCOUNTER — Other Ambulatory Visit: Payer: Self-pay | Admitting: Internal Medicine

## 2020-07-05 IMAGING — NM NM BONE 3 PHASE
10 series · 20 of 20 positions shown · non-contrast
Comparison: None.

Addendum:
CLINICAL DATA: Low back pain radiating to buttocks for a year.

EXAM:
NUCLEAR MEDICINE 3-PHASE BONE SCAN
TECHNIQUE: Radionuclide angiographic images, immediate static blood pool
images, and 3-hour delayed static images were obtained of the lumbar
spine after intravenous injection of radiopharmaceutical.
RADIOPHARMACEUTICALS:  21.8 mCi Uc-YYm MDP IV

[Series 1: flow · 2.07mm/px · 6 of 48 frames shown (1 of 2)]
[frame 5/48  full-range]
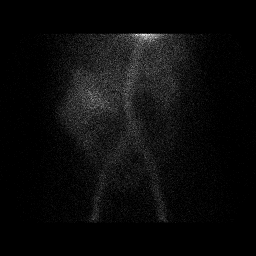
[frame 13/48  full-range]
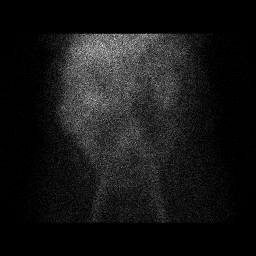
[frame 21/48  full-range]
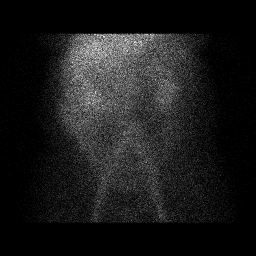
[frame 29/48  full-range]
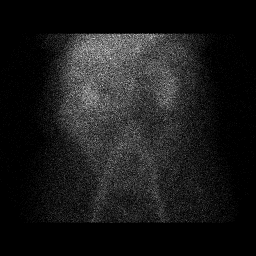
[frame 37/48  full-range]
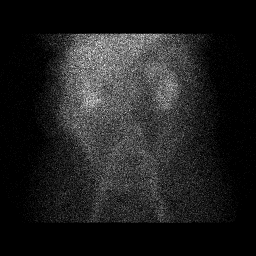
[frame 45/48  full-range]
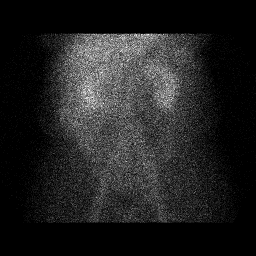

[Series 1: flow · 2.07mm/px · 6 of 48 frames shown (2 of 2)]
[frame 5/48  full-range]
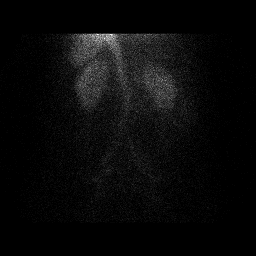
[frame 13/48  full-range]
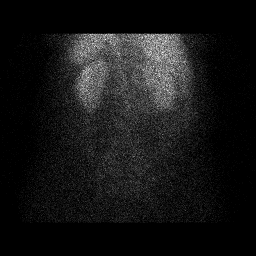
[frame 21/48  full-range]
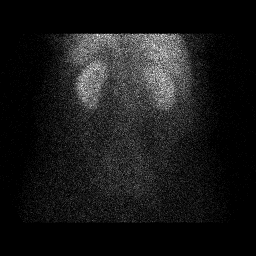
[frame 29/48  full-range]
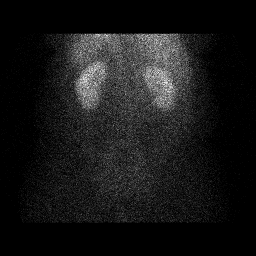
[frame 37/48  full-range]
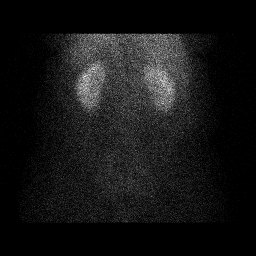
[frame 45/48  full-range]
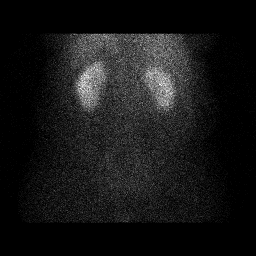

[Series 2: blood pool · 2.07mm/px · 1 of 1 slices shown (1 of 2)]
[im 1/1]
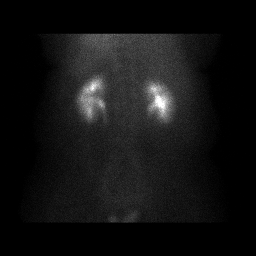

[Series 2: blood pool · 2.07mm/px · 1 of 1 slices shown (2 of 2)]
[im 1/1]
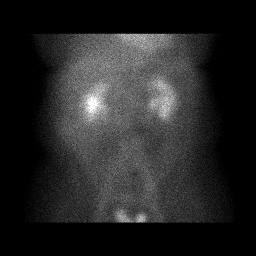

[Series 3: lat bp · 2.07mm/px · 1 of 1 slices shown (1 of 2)]
[im 1/1]
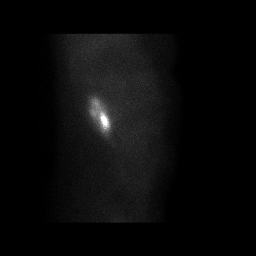

[Series 3: lat bp · 2.07mm/px · 1 of 1 slices shown (2 of 2)]
[im 1/1]
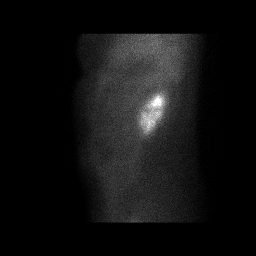

[Series 4: delay · delayed · 2.07mm/px · 1 of 1 slices shown (1 of 4)]
[im 1/1]
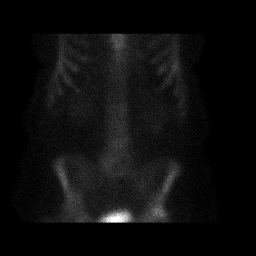

[Series 4: delay · delayed · 2.07mm/px · 1 of 1 slices shown (2 of 4)]
[im 1/1]
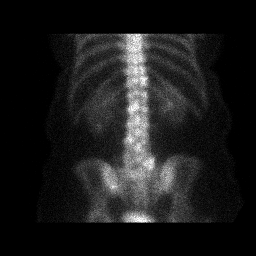

[Series 5: delay · delayed · 2.07mm/px · 1 of 1 slices shown (3 of 4)]
[im 1/1]
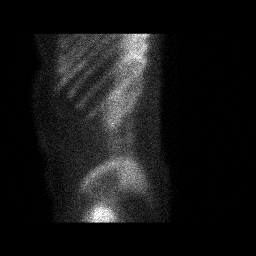

[Series 5: delay · delayed · 2.07mm/px · 1 of 1 slices shown (4 of 4)]
[im 1/1]
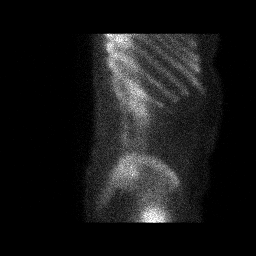

[20 of 20 positions shown; findings below may reference images not displayed]

FINDINGS: Vascular phase: Normal.

Blood pool phase: No significant increased blood pool uptake.

Delayed phase: Increased uptake scattered throughout the lumbar
spine, most prominent at the right L5-S1 facet.
IMPRESSION: Multilevel degenerative changes in lumbar spine, most prominent at
the right L5-S1 facet. No abnormal vascular blood pool uptake.

ADDENDUM:
An outside MRI from August 12, 2018 is now available for
comparison. By report, atypical hemangiomas were suggested in the L1
vertebral body and the S1 vertebral body. Both of these probable
atypical hemangiomas are located at and to the right of midline.

The uptake on the current bone scan does not correlate with the site
of suspected atypical hemangiomas. No focal abnormal uptake is seen
at either site.

The uptake scattered throughout the lumbar spine, particularly at
the right L5-S1 facet, is consistent with degenerative changes. No
suspicious lesions noted.
IMPRESSION: 1. Degenerative changes in the lumbar spine, most prominent at the
right L5-S1 facet.
2. No abnormal uptake at the site of either suspected atypical
hemangioma seen on the comparison MRI.
3. No suspicious lesions seen on today's study.

*** End of Addendum ***

## 2020-07-06 ENCOUNTER — Other Ambulatory Visit: Payer: Self-pay | Admitting: Internal Medicine

## 2020-07-26 DIAGNOSIS — M25562 Pain in left knee: Secondary | ICD-10-CM | POA: Diagnosis not present

## 2020-07-28 ENCOUNTER — Other Ambulatory Visit: Payer: Self-pay | Admitting: Internal Medicine

## 2020-07-29 ENCOUNTER — Telehealth: Payer: Self-pay | Admitting: Internal Medicine

## 2020-07-29 NOTE — Telephone Encounter (Signed)
Left message for patient to call back and schedule Medicare Annual Wellness Visit (AWV) either virtually or in office.  Last AWV 12/07/15; please schedule at anytime with [facility] Nurse Health Advisor 2.  This should be a 45 minute visit.

## 2020-08-01 ENCOUNTER — Other Ambulatory Visit: Payer: Self-pay | Admitting: Internal Medicine

## 2020-08-16 DIAGNOSIS — M25562 Pain in left knee: Secondary | ICD-10-CM | POA: Diagnosis not present

## 2020-08-20 DIAGNOSIS — M7122 Synovial cyst of popliteal space [Baker], left knee: Secondary | ICD-10-CM | POA: Diagnosis not present

## 2020-08-20 DIAGNOSIS — S83242A Other tear of medial meniscus, current injury, left knee, initial encounter: Secondary | ICD-10-CM | POA: Diagnosis not present

## 2020-08-20 DIAGNOSIS — M1712 Unilateral primary osteoarthritis, left knee: Secondary | ICD-10-CM | POA: Diagnosis not present

## 2020-08-20 DIAGNOSIS — M25562 Pain in left knee: Secondary | ICD-10-CM | POA: Diagnosis not present

## 2020-09-26 ENCOUNTER — Other Ambulatory Visit: Payer: Self-pay | Admitting: Internal Medicine

## 2020-10-06 ENCOUNTER — Other Ambulatory Visit: Payer: Self-pay | Admitting: Internal Medicine

## 2020-11-15 NOTE — Progress Notes (Signed)
Chief Complaint  Patient presents with   Annual Exam    HPI:  Elaine Howell 75 y.o. comMakya Phillisventive Medicare exam visit and medication evaluation.Since last visit.  She is having continuing problem with her back and her knees which is limited her ability to exercise and control her weight.  She has a visit with Dr. Despina Hick soon.  Has not been able to do treadmill or walking for very long.  Thyroid taking medicine regular  Mood seems to be okay taking low-dose Cymbalta and Wellbutrin.  Blood pressure taking medication no complaints with this  HLD taking rosuvastatin no concerns about side effect.   Health Maintenance  Topic Date Due   FOOT EXAM  Never done   OPHTHALMOLOGY EXAM  Never done   HEMOGLOBIN A1C  12/14/2020   TETANUS/TDAP  10/20/2024   INFLUENZA VACCINE  Completed   DEXA SCAN  Completed   COVID-19 Vaccine  Completed   Hepatitis C Screening  Completed   PNA vac Low Risk Adult  Completed   Health Maintenance Review LIFESTYLE:  Exercise:  Knee s and backs  Tobacco/ETS: no Alcohol:   2 per day  Sugar beverages:  Not really  Sleep:   Enough  Drug use: no HH:  1 no    Hearing: Hearing aids does not have them today  Vision:  No limitations at present . Last eye check UTD  Safety:  Has smoke detector and wears seat belts.   No excess sun exposure. Sees dentist regularly.  Memory: Felt to be good  , no concern from her or her family.  Depression: No anhedonia unusual crying or depressive symptoms  Nutrition: Eats well balanced diet; adequate calcium and vitamin D. No swallowing chewing problems.  Injury: no major injuries in the last six months.  Other healthcare providers:  Reviewed today .  Preventive parameters  Reviewed   ADLS:   There are no problems or need for assistance  driving, feeding, obtaining food, dressing, toileting and bathing, managing money using phone. She is independent.    ROS:  GEN/ HEENT: No  fever, significant weight changes sweats headaches vision problems hearing changes, CV/ PULM; No chest pain shortness of breath cough, syncope,edema  change in exercise tolerance. GI /GU: No adominal pain, vomiting, change in bowel habits. No blood in the stool. No significant GU symptoms. SKIN/HEME: ,no acute skin rashes suspicious lesions or bleeding. No lymphadenopathy, nodules, masses.  NEURO/ PSYCH:  No neurologic signs such as weakness numbness. No depression anxiety. IMM/ Allergy: No unusual infections.  Allergy .   REST of 12 system review negative except as per HPI   Past Medical History:  Diagnosis Date   Chronic insomnia    Depression    Heavy alcohol use 07/27/2010   Qualifier: Diagnosis of  By: Fabian Sharp MD, Neta Mends Daily use      Hyperlipidemia    Hypertension    INSOMNIA UNSPECIFIED 10/29/2007   Qualifier: Diagnosis of  By: Fabian Sharp MD, Neta Mends    LIVER FUNCTION TESTS, ABNORMAL 12/21/2008   Qualifier: Diagnosis of  By: Fabian Sharp MD, Neta Mends    Thyroiditis    positive antibodies    Family History  Problem Relation Age of Onset   Heart disease Mother    Alcohol abuse Father    Heart disease Father    Hypertension Sister     Social History   Socioeconomic History   Marital status: Married    Spouse name: Not  on file   Number of children: Not on file   Years of education: Not on file   Highest education level: Not on file  Occupational History   Not on file  Tobacco Use   Smoking status: Former Smoker    Start date: 07/24/1964    Quit date: 07/25/1999    Years since quitting: 21.3   Smokeless tobacco: Never Used  Vaping Use   Vaping Use: Never used  Substance and Sexual Activity   Alcohol use: Yes    Alcohol/week: 14.0 standard drinks    Types: 14 Glasses of wine per week    Comment: heavy alcohol use,  "its been years since ive gone a day without alcholol but i didnt feel sick when i wasnt drinking"    Drug use: No   Sexual activity:  Not on file  Other Topics Concern   Not on file  Social History Narrative      hh of 1   Part time [employer] english as a second language    Now retired    [facility] of 1 no pets   G2P2   Social Determinants of Corporate investment bankerHealth   Financial Resource Strain: Not on file  Food Insecurity: Not on file  Transportation Needs: Not on file  Physical Activity: Not on file  Stress: Not on file  Social Connections: Not on file    Outpatient Encounter Medications as of 11/16/2020  Medication Sig   acetaminophen (TYLENOL) 650 MG CR tablet Take 1,300 mg by mouth every 8 (eight) hours as needed for pain.   amLODipine (NORVASC) 10 MG tablet TAKE 1 TABLET(10 MG) BY MOUTH DAILY   amoxicillin (AMOXIL) 500 MG tablet amoxicillin 500 mg tablet  TAKE 4 TABLETS BY MOUTH 1 HOUR BEFORE DENTAL APPOINTMENT   aspirin EC 325 MG EC tablet Take 1 tablet (325 mg total) by mouth 2 (two) times daily.   buPROPion (WELLBUTRIN XL) 300 MG 24 hr tablet TAKE 1 TABLET(300 MG) BY MOUTH DAILY   diphenhydrAMINE HCl (ZZZQUIL) 50 MG/30ML LIQD Take 15-30 mLs by mouth at bedtime.   DULoxetine (CYMBALTA) 20 MG capsule TAKE ONE CAPSULE BY MOUTH DAILY. DECREASE TO EVERY OTHER DAY AND WEAN AS TOLERATED   levothyroxine (SYNTHROID) 88 MCG tablet Take 1 tablet (88 mcg total) by mouth daily.   losartan (COZAAR) 100 MG tablet TAKE 1 TABLET BY MOUTH DAILY. PLEASE SCHEDULE AN OFFICE VISIT FOR FURTHER REFILLS [phone]   rosuvastatin (CRESTOR) 10 MG tablet TAKE 1 TABLET BY MOUTH DAILY   No facility-administered encounter medications on file as of 11/16/2020.    EXAM:  BP 121/70 (BP Location: Right Arm, Patient Position: Sitting, Cuff Size: Normal)    Pulse 82    Temp (!) 97.5 F (36.4 C) (Oral)    Ht 5' 2.5" (1.588 m)    Wt 175 lb (79.4 kg)    SpO2 94%    BMI 31.50 kg/m   Body mass index is 31.5 kg/m.  Physical Exam: Vital signs reviewed UJW:JXBJGEN:This is a well-developed well-nourished alert cooperative   who appears stated age in no acute  distress.  HEENT: normocephalic atraumatic , Eyes: PERRL EOM's full, conjunctiva clear, Nares: paten,t no deformity discharge or tenderness., Ears: no deformity EAC's clear TMs with normal landmarks. Mouth masked  NECK: supple without masses, thyroid seems palpable no nodules no bruits o CHEST/PULM:  Clear to auscultation and percussion breath sounds equal no wheeze , rales or rhonchi. No chest wall deformities or tenderness. CV: PMI is nondisplaced, S1  S2 no gallops, murmurs, rubs. Peripheral pulses are full without delay.No JVD .  ABDOMEN: Bowel sounds normal nontender  No guard or rebound, no hepato splenomegal no CVA tenderness.   Extremtities:  No clubbing cyanosis or edema, no acute joint swelling or redness no focal atrophy mildly antalgic gait. NEURO:  Oriented x3, cranial nerves 3-12 appear to be intact, no obvious focal weakness,gait within normal limits no abnormal reflexes or asymmetrical SKIN: No acute rashes normal turgor, color, no bruising or petechiae. PSYCH: Oriented, good eye contact, no obvious depression anxiety, cognition and judgment appear normal. LN: no cervical axillary iadenopathy No noted deficits in memory, attention, and speech.   Lab Results  Component Value Date   WBC 4.9 06/16/2020   HGB 13.4 06/16/2020   HCT 40.3 06/16/2020   PLT 378 06/16/2020   GLUCOSE 89 06/16/2020   CHOL 238 (H) 06/16/2020   TRIG 71 06/16/2020   HDL 107 06/16/2020   LDLDIRECT 146.3 09/17/2013   LDLCALC 114 (H) 06/16/2020   ALT 25 06/16/2020   AST 22 06/16/2020   NA 140 06/16/2020   K 4.6 06/16/2020   CL 104 06/16/2020   CREATININE 0.77 06/16/2020   BUN 13 06/16/2020   CO2 26 06/16/2020   TSH 1.56 06/16/2020   INR 0.8 07/17/2019   HGBA1C 5.3 06/16/2020    ASSESSMENT AND PLAN:  Discussed the following assessment and plan:  Visit for preventive health examination  Medication management - Plan: Lipid panel, Basic metabolic panel, Hemoglobin A1c, TSH, Hepatic function  panel, Hepatic function panel, TSH, Hemoglobin A1c, Basic metabolic panel, Lipid panel  Essential hypertension - Plan: Lipid panel, Basic metabolic panel, Hemoglobin A1c, TSH, Hepatic function panel, Hepatic function panel, TSH, Hemoglobin A1c, Basic metabolic panel, Lipid panel  Hypothyroidism, unspecified type - Plan: Lipid panel, Basic metabolic panel, Hemoglobin A1c, TSH, Hepatic function panel, Hepatic function panel, TSH, Hemoglobin A1c, Basic metabolic panel, Lipid panel  Hyperlipidemia, unspecified hyperlipidemia type - Plan: Lipid panel, Basic metabolic panel, Hemoglobin A1c, TSH, Hepatic function panel, Hepatic function panel, TSH, Hemoglobin A1c, Basic metabolic panel, Lipid panel  Wears hearing aid - Plan: Lipid panel, Basic metabolic panel, Hemoglobin A1c, TSH, Hepatic function panel, Hepatic function panel, TSH, Hemoglobin A1c, Basic metabolic panel, Lipid panel  Arthritis of knee - Plan: Lipid panel, Basic metabolic panel, Hemoglobin A1c, TSH, Hepatic function panel, Hepatic function panel, TSH, Hemoglobin A1c, Basic metabolic panel, Lipid panel  Estrogen deficiency - Plan: DG Bone Density  Sleep difficulties dexa scan update    Bone health  Calc vit d   Same meds for now  Frustration about dec mobility    llok into adaptive exercise  Water exercise etc for now  cpx in 1 year depending  Disc  sleep aids  Sleep hygiene no good answer with meds  Because of se  Brain memory effects  dependency ok to try melatonin   Etc  Patient Care Team: Madelin Headings, MD as PCP - General Jethro Bolus, MD as Attending Physician (Ophthalmology) Shea Evans, MD as Attending Physician (Obstetrics and Gynecology)  Patient Instructions   Try water exercise   Sleep hygiene  Avoid routine meds .  Can try melatonin 2 hours before sleep time. Sometimes helps .   Will notify you  of labs when available.   Get appt for bone density at [facility] [address].    Bone Health Bones  protect organs, store calcium, anchor muscles, and support the whole body. Keeping your bones strong  is important, especially as you get older. You can take actions to help keep your bones strong and healthy. Why is keeping my bones healthy important? Keeping your bones healthy is important because your body constantly replaces bone cells. Cells get old, and new cells take their place. As we age, we lose bone cells because the body may not be able to make enough new cells to replace the old cells. The amount of bone cells and bone tissue you have is referred to as bone mass. The higher your bone mass, the stronger your bones. The aging process leads to an overall loss of bone mass in the body, which can increase the likelihood of:  Joint pain and stiffness.  Broken bones.  A condition in which the bones become weak and brittle (osteoporosis). A large decline in bone mass occurs in older adults. In women, it occurs about the time of menopause.   What actions can I take to keep my bones healthy? Good health habits are important for maintaining healthy bones. This includes eating nutritious foods and exercising regularly. To have healthy bones, you need to get enough of the right minerals and vitamins. Most nutrition experts recommend getting these nutrients from the foods that you eat. In some cases, taking supplements may also be recommended. Doing certain types of exercise is also important for bone health. What are the nutritional recommendations for healthy bones? Eating a well-balanced diet with plenty of calcium and vitamin D will help to protect your bones. Nutritional recommendations vary from person to person. Ask your health care provider what is healthy for you. Here are some general guidelines. Get enough calcium Calcium is the most important (essential) mineral for bone health. Most people can get enough calcium from their diet, but supplements may be recommended for people who are at risk  for osteoporosis. Good sources of calcium include:  Dairy products, such as low-fat or nonfat milk, cheese, and yogurt.  Dark green leafy vegetables, such as bok choy and broccoli.  Calcium-fortified foods, such as orange juice, cereal, bread, soy beverages, and tofu products.  Nuts, such as almonds. Follow these recommended amounts for daily calcium intake:  Children, age 52-3: 700 mg.  Children, age 89-8: 1,000 mg.  Children, age 55-13: 1,300 mg.  Teens, age 890-18: 1,300 mg.  Adults, age 23-50: 1,000 mg.  Adults, age 523-70: ? Men: 1,000 mg. ? Women: 1,200 mg.  Adults, age 890 or older: 1,200 mg.  Pregnant and breastfeeding females: ? Teens: 1,300 mg. ? Adults: 1,000 mg. Get enough vitamin D Vitamin D is the most essential vitamin for bone health. It helps the body absorb calcium. Sunlight stimulates the skin to make vitamin D, so be sure to get enough sunlight. If you live in a cold climate or you do not get outside often, your health care provider may recommend that you take vitamin D supplements. Good sources of vitamin D in your diet include:  Egg yolks.  Saltwater fish.  Milk and cereal fortified with vitamin D. Follow these recommended amounts for daily vitamin D intake:  Children and teens, age 52-18: 600 international units.  Adults, age 8 or younger: 400-800 international units.  Adults, age 27 or older: 800-1,000 international units. Get other important nutrients Other nutrients that are important for bone health include:  Phosphorus. This mineral is found in meat, poultry, dairy foods, nuts, and legumes. The recommended daily intake for adult men and adult women is 700 mg.  Magnesium. This mineral is found  in seeds, nuts, dark green vegetables, and legumes. The recommended daily intake for adult men is 400-420 mg. For adult women, it is 310-320 mg.  Vitamin K. This vitamin is found in green leafy vegetables. The recommended daily intake is 120 mg for adult  men and 90 mg for adult women.   What type of physical activity is best for building and maintaining healthy bones? Weight-bearing and strength-building activities are important for building and maintaining healthy bones. Weight-bearing activities cause muscles and bones to work against gravity. Strength-building activities increase the strength of the muscles that support bones. Weight-bearing and muscle-building activities include:  Walking and hiking.  Jogging and running.  Dancing.  Gym exercises.  Lifting weights.  Tennis and racquetball.  Climbing stairs.  Aerobics. Adults should get at least 30 minutes of moderate physical activity on most days. Children should get at least 60 minutes of moderate physical activity on most days. Ask your health care provider what type of exercise is best for you.   How can I find out if my bone mass is low? Bone mass can be measured with an X-ray test called a bone mineral density (BMD) test. This test is recommended for all women who are age 58 or older. It may also be recommended for:  Men who are age 101 or older.  People who are at risk for osteoporosis because of: ? Having bones that break easily. ? Having a long-term disease that weakens bones, such as kidney disease or rheumatoid arthritis. ? Having menopause earlier than normal. ? Taking medicine that weakens bones, such as steroids, thyroid hormones, or hormone treatment for breast cancer or prostate cancer. ? Smoking. ? Drinking three or more alcoholic drinks a day. If you find that you have a low bone mass, you may be able to prevent osteoporosis or further bone loss by changing your diet and lifestyle. Where can I find more information? For more information, check out the following websites:  National Osteoporosis Foundation: https://carlson-fletcher.info/  [employer]: www.bones.http://www.myers.net/  International Osteoporosis Foundation: Investment banker, operational.iofbonehealth.org Summary  The  aging process leads to an overall loss of bone mass in the body, which can increase the likelihood of broken bones and osteoporosis.  Eating a well-balanced diet with plenty of calcium and vitamin D will help to protect your bones.  Weight-bearing and strength-building activities are also important for building and maintaining strong bones.  Bone mass can be measured with an X-ray test called a bone mineral density (BMD) test. This information is not intended to replace advice given to you by your health care provider. Make sure you discuss any questions you have with your health care provider. Document Revised: 10/15/2017 Document Reviewed: 10/15/2017 Elsevier Patient Education  2021 [employer].    [place]. Traevon Meiring M.D.

## 2020-11-16 ENCOUNTER — Ambulatory Visit (INDEPENDENT_AMBULATORY_CARE_PROVIDER_SITE_OTHER): Payer: Medicare Other | Admitting: Internal Medicine

## 2020-11-16 ENCOUNTER — Encounter: Payer: Self-pay | Admitting: Internal Medicine

## 2020-11-16 ENCOUNTER — Other Ambulatory Visit: Payer: Self-pay

## 2020-11-16 VITALS — BP 121/70 | HR 82 | Temp 97.5°F | Ht 62.5 in | Wt 175.0 lb

## 2020-11-16 DIAGNOSIS — Z0000 Encounter for general adult medical examination without abnormal findings: Secondary | ICD-10-CM

## 2020-11-16 DIAGNOSIS — Z974 Presence of external hearing-aid: Secondary | ICD-10-CM

## 2020-11-16 DIAGNOSIS — E785 Hyperlipidemia, unspecified: Secondary | ICD-10-CM

## 2020-11-16 DIAGNOSIS — M1710 Unilateral primary osteoarthritis, unspecified knee: Secondary | ICD-10-CM | POA: Diagnosis not present

## 2020-11-16 DIAGNOSIS — I10 Essential (primary) hypertension: Secondary | ICD-10-CM | POA: Diagnosis not present

## 2020-11-16 DIAGNOSIS — Z79899 Other long term (current) drug therapy: Secondary | ICD-10-CM

## 2020-11-16 DIAGNOSIS — E039 Hypothyroidism, unspecified: Secondary | ICD-10-CM

## 2020-11-16 DIAGNOSIS — G479 Sleep disorder, unspecified: Secondary | ICD-10-CM

## 2020-11-16 DIAGNOSIS — E2839 Other primary ovarian failure: Secondary | ICD-10-CM

## 2020-11-16 NOTE — Patient Instructions (Addendum)
Try water exercise   Sleep hygiene  Avoid routine meds .  Can try melatonin 2 hours before sleep time. Sometimes helps .   Will notify you  of labs when available.   Get appt for bone density at [facility] [address]    Bone Health Bones protect organs, store calcium, anchor muscles, and support the whole body. Keeping your bones strong is important, especially as you get older. You can take actions to help keep your bones strong and healthy. Why is keeping my bones healthy important? Keeping your bones healthy is important because your body constantly replaces bone cells. Cells get old, and new cells take their place. As we age, we lose bone cells because the body may not be able to make enough new cells to replace the old cells. The amount of bone cells and bone tissue you have is referred to as bone mass. The higher your bone mass, the stronger your bones. The aging process leads to an overall loss of bone mass in the body, which can increase the likelihood of:  Joint pain and stiffness.  Broken bones.  A condition in which the bones become weak and brittle (osteoporosis). A large decline in bone mass occurs in older adults. In women, it occurs about the time of menopause.   What actions can I take to keep my bones healthy? Good health habits are important for maintaining healthy bones. This includes eating nutritious foods and exercising regularly. To have healthy bones, you need to get enough of the right minerals and vitamins. Most nutrition experts recommend getting these nutrients from the foods that you eat. In some cases, taking supplements may also be recommended. Doing certain types of exercise is also important for bone health. What are the nutritional recommendations for healthy bones? Eating a well-balanced diet with plenty of calcium and vitamin D will help to protect your bones. Nutritional recommendations vary from person to person. Ask your health care provider what is  healthy for you. Here are some general guidelines. Get enough calcium Calcium is the most important (essential) mineral for bone health. Most people can get enough calcium from their diet, but supplements may be recommended for people who are at risk for osteoporosis. Good sources of calcium include:  Dairy products, such as low-fat or nonfat milk, cheese, and yogurt.  Dark green leafy vegetables, such as bok choy and broccoli.  Calcium-fortified foods, such as orange juice, cereal, bread, soy beverages, and tofu products.  Nuts, such as almonds. Follow these recommended amounts for daily calcium intake:  Children, age 11-3: 700 mg.  Children, age 70-8: 1,000 mg.  Children, age 66-13: 1,300 mg.  Teens, age 117-18: 1,300 mg.  Adults, age 80-50: 1,000 mg.  Adults, age 27-70: ? Men: 1,000 mg. ? Women: 1,200 mg.  Adults, age 707 or older: 1,200 mg.  Pregnant and breastfeeding females: ? Teens: 1,300 mg. ? Adults: 1,000 mg. Get enough vitamin D Vitamin D is the most essential vitamin for bone health. It helps the body absorb calcium. Sunlight stimulates the skin to make vitamin D, so be sure to get enough sunlight. If you live in a cold climate or you do not get outside often, your health care provider may recommend that you take vitamin D supplements. Good sources of vitamin D in your diet include:  Egg yolks.  Saltwater fish.  Milk and cereal fortified with vitamin D. Follow these recommended amounts for daily vitamin D intake:  Children and teens,  age 33-18: 600 international units.  Adults, age 32 or younger: 400-800 international units.  Adults, age 24 or older: 800-1,000 international units. Get other important nutrients Other nutrients that are important for bone health include:  Phosphorus. This mineral is found in meat, poultry, dairy foods, nuts, and legumes. The recommended daily intake for adult men and adult women is 700 mg.  Magnesium. This mineral is found in  seeds, nuts, dark green vegetables, and legumes. The recommended daily intake for adult men is 400-420 mg. For adult women, it is 310-320 mg.  Vitamin K. This vitamin is found in green leafy vegetables. The recommended daily intake is 120 mg for adult men and 90 mg for adult women.   What type of physical activity is best for building and maintaining healthy bones? Weight-bearing and strength-building activities are important for building and maintaining healthy bones. Weight-bearing activities cause muscles and bones to work against gravity. Strength-building activities increase the strength of the muscles that support bones. Weight-bearing and muscle-building activities include:  Walking and hiking.  Jogging and running.  Dancing.  Gym exercises.  Lifting weights.  Tennis and racquetball.  Climbing stairs.  Aerobics. Adults should get at least 30 minutes of moderate physical activity on most days. Children should get at least 60 minutes of moderate physical activity on most days. Ask your health care provider what type of exercise is best for you.   How can I find out if my bone mass is low? Bone mass can be measured with an X-ray test called a bone mineral density (BMD) test. This test is recommended for all women who are age 69 or older. It may also be recommended for:  Men who are age 335 or older.  People who are at risk for osteoporosis because of: ? Having bones that break easily. ? Having a long-term disease that weakens bones, such as kidney disease or rheumatoid arthritis. ? Having menopause earlier than normal. ? Taking medicine that weakens bones, such as steroids, thyroid hormones, or hormone treatment for breast cancer or prostate cancer. ? Smoking. ? Drinking three or more alcoholic drinks a day. If you find that you have a low bone mass, you may be able to prevent osteoporosis or further bone loss by changing your diet and lifestyle. Where can I find more  information? For more information, check out the following websites:  National Osteoporosis Foundation: https://carlson-fletcher.info/  [employer]: www.bones.http://www.myers.net/  International Osteoporosis Foundation: Investment banker, operational.iofbonehealth.org Summary  The aging process leads to an overall loss of bone mass in the body, which can increase the likelihood of broken bones and osteoporosis.  Eating a well-balanced diet with plenty of calcium and vitamin D will help to protect your bones.  Weight-bearing and strength-building activities are also important for building and maintaining strong bones.  Bone mass can be measured with an X-ray test called a bone mineral density (BMD) test. This information is not intended to replace advice given to you by your health care provider. Make sure you discuss any questions you have with your health care provider. Document Revised: 10/15/2017 Document Reviewed: 10/15/2017 Elsevier Patient Education  2021 [employer].

## 2020-11-17 LAB — HEPATIC FUNCTION PANEL
ALT: 31 U/L (ref 0–35)
AST: 22 U/L (ref 0–37)
Albumin: 4.6 g/dL (ref 3.5–5.2)
Alkaline Phosphatase: 97 U/L (ref 39–117)
Bilirubin, Direct: 0.1 mg/dL (ref 0.0–0.3)
Total Bilirubin: 0.6 mg/dL (ref 0.2–1.2)
Total Protein: 7.2 g/dL (ref 6.0–8.3)

## 2020-11-17 LAB — LIPID PANEL
Cholesterol: 255 mg/dL — ABNORMAL HIGH (ref 0–200)
HDL: 117.9 mg/dL (ref >39.00)
LDL Cholesterol: 123 mg/dL — ABNORMAL HIGH (ref 0–99)
NonHDL: 137.37
Total CHOL/HDL Ratio: 2
Triglycerides: 72 mg/dL (ref 0.0–149.0)
VLDL: 14.4 mg/dL (ref 0.0–40.0)

## 2020-11-17 LAB — BASIC METABOLIC PANEL
BUN: 17 mg/dL (ref 6–23)
CO2: 26 mEq/L (ref 19–32)
Calcium: 9.8 mg/dL (ref 8.4–10.5)
Chloride: 104 mEq/L (ref 96–112)
Creatinine, Ser: 0.72 mg/dL (ref 0.40–1.20)
GFR: 80.87 mL/min (ref >60.00)
Glucose, Bld: 94 mg/dL (ref 70–99)
Potassium: 4.2 mEq/L (ref 3.5–5.1)
Sodium: 140 mEq/L (ref 135–145)

## 2020-11-17 LAB — HEMOGLOBIN A1C: Hgb A1c MFr Bld: 5.5 % (ref 4.6–6.5)

## 2020-11-17 LAB — TSH: TSH: 1.29 u[IU]/mL (ref 0.35–4.50)

## 2020-11-21 NOTE — Progress Notes (Signed)
Results in  range  cholesterol    good cholesterol is high so acceptable at this time .  Continue attention to lifestyle intervention healthy eating and exercise .

## 2020-11-22 ENCOUNTER — Other Ambulatory Visit: Payer: Self-pay

## 2020-11-22 ENCOUNTER — Ambulatory Visit (INDEPENDENT_AMBULATORY_CARE_PROVIDER_SITE_OTHER)
Admission: RE | Admit: 2020-11-22 | Discharge: 2020-11-22 | Disposition: A | Discharge status: Home / Self Care | Payer: Medicare Other | Source: Ambulatory Visit | Attending: Internal Medicine | Admitting: Internal Medicine

## 2020-11-22 DIAGNOSIS — E2839 Other primary ovarian failure: Secondary | ICD-10-CM | POA: Diagnosis not present

## 2020-11-23 ENCOUNTER — Other Ambulatory Visit: Payer: Medicare Other

## 2020-11-30 ENCOUNTER — Telehealth: Payer: Self-pay | Admitting: Internal Medicine

## 2020-11-30 NOTE — Progress Notes (Signed)
  Chronic Care Management   Outreach Note  11/30/2020 Name: Elaine Howell MRN: [id number] DOB: 05-10-1946  Referred by: Madelin Headings, MD Reason for referral : No chief complaint on file.   An unsuccessful telephone outreach was attempted today. The patient was referred to the pharmacist for assistance with care management and care coordination.   Follow Up Plan:   Carley Perdue [employer] Scheduler

## 2020-12-01 ENCOUNTER — Other Ambulatory Visit: Payer: Self-pay | Admitting: Internal Medicine

## 2020-12-05 NOTE — Progress Notes (Signed)
Bone density   I s slightly low ( osteopenia)  but no osteoporosis . Continue lifestyle intervention healthy eating and weight bearing exercise .   Can repeat in 2-3 years

## 2020-12-06 NOTE — Progress Notes (Signed)
Elaine Howell while entering [employer] today Tripped possibly Found lying on back lifting head off concrete with ice pak on forehead. C/O pain to bil knees R>L No neck pain, PMH HTN, chol Takes BP meds, no blood thinners no allergies  Did not take today 155/90 80  Did not want any one called Awake alert and oriented. Does not want to seek treatment. EMS called to double check her.  Requested to sit up. No dizziness. Requested to stand. Helped to sit on bench.  Evaled by EMS did not want to seek treatment, requested to drive self home  EMS provider helped her to car.

## 2020-12-07 DIAGNOSIS — M79641 Pain in right hand: Secondary | ICD-10-CM | POA: Diagnosis not present

## 2020-12-07 DIAGNOSIS — M25561 Pain in right knee: Secondary | ICD-10-CM | POA: Diagnosis not present

## 2020-12-07 DIAGNOSIS — M79642 Pain in left hand: Secondary | ICD-10-CM | POA: Diagnosis not present

## 2020-12-23 DIAGNOSIS — S82001D Unspecified fracture of right patella, subsequent encounter for closed fracture with routine healing: Secondary | ICD-10-CM | POA: Diagnosis not present

## 2020-12-23 DIAGNOSIS — M1711 Unilateral primary osteoarthritis, right knee: Secondary | ICD-10-CM | POA: Diagnosis not present

## 2020-12-31 ENCOUNTER — Other Ambulatory Visit: Payer: Self-pay | Admitting: Internal Medicine

## 2021-01-05 ENCOUNTER — Other Ambulatory Visit: Payer: Self-pay | Admitting: Internal Medicine

## 2021-01-05 DIAGNOSIS — Z1231 Encounter for screening mammogram for malignant neoplasm of breast: Secondary | ICD-10-CM

## 2021-01-06 DIAGNOSIS — M170 Bilateral primary osteoarthritis of knee: Secondary | ICD-10-CM | POA: Diagnosis not present

## 2021-01-20 ENCOUNTER — Ambulatory Visit
Admission: RE | Admit: 2021-01-20 | Discharge: 2021-01-20 | Disposition: A | Discharge status: Home / Self Care | Payer: Medicare Other | Source: Ambulatory Visit | Attending: Internal Medicine | Admitting: Internal Medicine

## 2021-01-20 ENCOUNTER — Other Ambulatory Visit: Payer: Self-pay

## 2021-01-20 DIAGNOSIS — Z1231 Encounter for screening mammogram for malignant neoplasm of breast: Secondary | ICD-10-CM | POA: Diagnosis not present

## 2021-01-30 ENCOUNTER — Other Ambulatory Visit: Payer: Self-pay | Admitting: Internal Medicine

## 2021-02-14 ENCOUNTER — Telehealth: Payer: Self-pay | Admitting: Internal Medicine

## 2021-02-14 NOTE — Telephone Encounter (Signed)
Spoke with patient to  schedule Medicare Annual Wellness Visit (AWV) either virtually or in office.  She stated she will call back to schedule  Last AWV 12/07/15  please schedule at anytime with [facility] Nurse Health Advisor 1 or 2   This should be a 45 minute visit.

## 2021-02-26 DIAGNOSIS — Z20822 Contact with and (suspected) exposure to covid-19: Secondary | ICD-10-CM | POA: Diagnosis not present

## 2021-02-26 DIAGNOSIS — R6889 Other general symptoms and signs: Secondary | ICD-10-CM | POA: Diagnosis not present

## 2021-02-26 DIAGNOSIS — J069 Acute upper respiratory infection, unspecified: Secondary | ICD-10-CM | POA: Diagnosis not present

## 2021-02-27 ENCOUNTER — Other Ambulatory Visit: Payer: Self-pay | Admitting: Internal Medicine

## 2021-03-02 ENCOUNTER — Telehealth: Payer: Self-pay | Admitting: Internal Medicine

## 2021-03-02 NOTE — Chronic Care Management (AMB) (Signed)
  Chronic Care Management   Outreach Note  03/02/2021 Name: Eura Mccauslin MRN: [id number] DOB: 08-Aug-1946  Referred by: Madelin Headings, MD Reason for referral : No chief complaint on file.   A second unsuccessful telephone outreach was attempted today. The patient was referred to pharmacist for assistance with care management and care coordination.  Follow Up Plan:   Tatjana Dellinger Upstream Scheduler

## 2021-03-16 ENCOUNTER — Telehealth: Payer: Self-pay | Admitting: Internal Medicine

## 2021-03-16 NOTE — Chronic Care Management (AMB) (Signed)
  Chronic Care Management   Outreach Note  03/16/2021 Name: Elaine Howell MRN: [id number] DOB: [birth year]  Referred by: Madelin Headings, MD Reason for referral : No chief complaint on file.   Third unsuccessful telephone outreach was attempted today. The patient was referred to the pharmacist for assistance with care management and care coordination.   Follow Up Plan:   Tatjana Dellinger Upstream Scheduler

## 2021-03-29 ENCOUNTER — Other Ambulatory Visit: Payer: Self-pay

## 2021-03-29 MED ORDER — AMLODIPINE BESYLATE 10 MG PO TABS: ORAL_TABLET | ORAL | 0 refills | Status: DC

## 2021-04-29 ENCOUNTER — Telehealth: Payer: Self-pay | Admitting: Internal Medicine

## 2021-04-29 NOTE — Telephone Encounter (Signed)
Tried calling patient to schedule Medicare Annual Wellness Visit (AWV) either virtually or in office.   No answre   Last AWV 12/07/15  please schedule at anytime with [facility] Nurse Health Advisor 1 or 2   This should be a 45 minute visit.

## 2021-05-12 ENCOUNTER — Telehealth: Payer: Self-pay | Admitting: Internal Medicine

## 2021-05-12 NOTE — Telephone Encounter (Signed)
Tried calling patient to schedule Medicare Annual Wellness Visit (AWV) either virtually or in office.   No answer  Last AWV 12/07/15   please schedule at anytime with [facility] Nurse Health Advisor 1 or 2   This should be a 45 minute visit.

## 2021-06-14 ENCOUNTER — Other Ambulatory Visit: Payer: Self-pay

## 2021-06-14 MED ORDER — BUPROPION HCL ER (XL) 300 MG PO TB24: ORAL_TABLET | ORAL | 2 refills | Status: DC

## 2021-06-22 ENCOUNTER — Telehealth: Payer: Self-pay | Admitting: Internal Medicine

## 2021-06-22 NOTE — Telephone Encounter (Signed)
Tried calling patient to schedule Medicare Annual Wellness Visit (AWV) either virtually or in office. Left  my jabber number [phone]  No answer  Last AWV ;12/07/15  please schedule at anytime with [facility] Nurse Health Advisor 1 or 2   This should be a 45 minute visit.

## 2021-06-27 ENCOUNTER — Other Ambulatory Visit: Payer: Self-pay | Admitting: Internal Medicine

## 2021-07-27 ENCOUNTER — Other Ambulatory Visit: Payer: Self-pay

## 2021-07-27 ENCOUNTER — Ambulatory Visit (INDEPENDENT_AMBULATORY_CARE_PROVIDER_SITE_OTHER): Payer: Medicare Other

## 2021-07-27 DIAGNOSIS — Z23 Encounter for immunization: Secondary | ICD-10-CM | POA: Diagnosis not present

## 2021-09-23 ENCOUNTER — Other Ambulatory Visit: Payer: Self-pay | Admitting: Internal Medicine

## 2021-11-04 DIAGNOSIS — M170 Bilateral primary osteoarthritis of knee: Secondary | ICD-10-CM | POA: Diagnosis not present

## 2021-11-21 ENCOUNTER — Encounter: Payer: Self-pay | Admitting: Internal Medicine

## 2021-11-21 ENCOUNTER — Ambulatory Visit (INDEPENDENT_AMBULATORY_CARE_PROVIDER_SITE_OTHER): Payer: Medicare Other | Admitting: Internal Medicine

## 2021-11-21 VITALS — BP 130/74 | HR 90 | Temp 98.4°F | Ht 63.5 in | Wt 172.0 lb

## 2021-11-21 DIAGNOSIS — Z0000 Encounter for general adult medical examination without abnormal findings: Secondary | ICD-10-CM

## 2021-11-21 DIAGNOSIS — E785 Hyperlipidemia, unspecified: Secondary | ICD-10-CM

## 2021-11-21 DIAGNOSIS — Z974 Presence of external hearing-aid: Secondary | ICD-10-CM | POA: Diagnosis not present

## 2021-11-21 DIAGNOSIS — I10 Essential (primary) hypertension: Secondary | ICD-10-CM

## 2021-11-21 DIAGNOSIS — E039 Hypothyroidism, unspecified: Secondary | ICD-10-CM

## 2021-11-21 DIAGNOSIS — Z79899 Other long term (current) drug therapy: Secondary | ICD-10-CM

## 2021-11-21 LAB — CBC WITH DIFFERENTIAL/PLATELET
Basophils Absolute: 0 10*3/uL (ref 0.0–0.1)
Basophils Relative: 0.9 % (ref 0.0–3.0)
Eosinophils Absolute: 0.1 10*3/uL (ref 0.0–0.7)
Eosinophils Relative: 1.6 % (ref 0.0–5.0)
HCT: 41.6 % (ref 36.0–46.0)
Hemoglobin: 13.7 g/dL (ref 12.0–15.0)
Lymphocytes Relative: 29.1 % (ref 12.0–46.0)
Lymphs Abs: 1.4 10*3/uL (ref 0.7–4.0)
MCHC: 32.9 g/dL (ref 30.0–36.0)
MCV: 92.7 fl (ref 78.0–100.0)
Monocytes Absolute: 0.5 10*3/uL (ref 0.1–1.0)
Monocytes Relative: 10.1 % (ref 3.0–12.0)
Neutro Abs: 2.9 10*3/uL (ref 1.4–7.7)
Neutrophils Relative %: 58.3 % (ref 43.0–77.0)
Platelets: 340 10*3/uL (ref 150.0–400.0)
RBC: 4.48 Mil/uL (ref 3.87–5.11)
RDW: 13.9 % (ref 11.5–15.5)
WBC: 5 10*3/uL (ref 4.0–10.5)

## 2021-11-21 LAB — BASIC METABOLIC PANEL
BUN: 16 mg/dL (ref 6–23)
CO2: 28 mEq/L (ref 19–32)
Calcium: 10.5 mg/dL (ref 8.4–10.5)
Chloride: 102 mEq/L (ref 96–112)
Creatinine, Ser: 0.74 mg/dL (ref 0.40–1.20)
GFR: 77.7 mL/min (ref >60.00)
Glucose, Bld: 87 mg/dL (ref 70–99)
Potassium: 4.5 mEq/L (ref 3.5–5.1)
Sodium: 137 mEq/L (ref 135–145)

## 2021-11-21 LAB — LIPID PANEL
Cholesterol: 256 mg/dL — ABNORMAL HIGH (ref 0–200)
HDL: 107 mg/dL (ref >39.00)
LDL Cholesterol: 137 mg/dL — ABNORMAL HIGH (ref 0–99)
NonHDL: 149.48
Total CHOL/HDL Ratio: 2
Triglycerides: 64 mg/dL (ref 0.0–149.0)
VLDL: 12.8 mg/dL (ref 0.0–40.0)

## 2021-11-21 LAB — HEPATIC FUNCTION PANEL
ALT: 25 U/L (ref 0–35)
AST: 23 U/L (ref 0–37)
Albumin: 4.8 g/dL (ref 3.5–5.2)
Alkaline Phosphatase: 73 U/L (ref 39–117)
Bilirubin, Direct: 0.1 mg/dL (ref 0.0–0.3)
Total Bilirubin: 0.6 mg/dL (ref 0.2–1.2)
Total Protein: 7.6 g/dL (ref 6.0–8.3)

## 2021-11-21 LAB — TSH: TSH: 3.98 u[IU]/mL (ref 0.35–5.50)

## 2021-11-21 NOTE — Progress Notes (Signed)
Chief Complaint  Patient presents with   Annual Exam    fasting    HPI: Patient  Elaine Howell  76 y.o. comes in today for Preventive Health Care visit  and medicine evaluation: Thyroid taking medication no concerns  Hypertension taking continue medication has not checked it recently seems to be okay on amlodipine and losartan.  HLD on rosuvastatin  Mood seems stable [place] and son live sister lives up in [place] area.  Has been trying to lose weight again tried keto diet and ended up gaining weight so she is back on a previous type eating to help.  States that as far as activity goes her knees are somewhat limiting.  Used to go to the gym do water exercises but not since the pandemic.  Health Maintenance  Topic Date Due   HEMOGLOBIN A1C  05/16/2021   FOOT EXAM  05/21/2022 (Originally 11/03/1955)   OPHTHALMOLOGY EXAM  05/21/2022 (Originally 11/03/1955)   COVID-19 Vaccine (4 - Booster for Moderna series) 05/21/2022 (Originally 09/29/2020)   Zoster Vaccines- Shingrix (1 of 2) 05/21/2022 (Originally 11/03/1964)   TETANUS/TDAP  10/20/2024   Pneumonia Vaccine 365+ Years old  Completed   INFLUENZA VACCINE  Completed   DEXA SCAN  Completed   Hepatitis C Screening  Completed   HPV VACCINES  Aged Out   COLONOSCOPY (Pts 45-6531yrs Insurance coverage will need to be confirmed)  Discontinued   Health Maintenance Review LIFESTYLE:  Exercise:   not m no recnet uch cause of knees  Tobacco/ETS n Alcohol: recently none for a week  cut down  Sugar beverages: Sleep: Drug use: no HH of  1 no pets  Back and forth to [place]   ROS:  REST of 12 system review negative except as per HPI   Past Medical History:  Diagnosis Date   Chronic insomnia    Depression    Heavy alcohol use 07/27/2010   Qualifier: Diagnosis of  By: Elaine Howell, Neta MendsWanda K Howell use      Hyperlipidemia    Hypertension    INSOMNIA UNSPECIFIED 10/29/2007   Qualifier:  Diagnosis of  By: Elaine Howell, Neta MendsWanda K    LIVER FUNCTION TESTS, ABNORMAL 12/21/2008   Qualifier: Diagnosis of  By: Elaine Howell, Neta MendsWanda K    Thyroiditis    positive antibodies    Past Surgical History:  Procedure Laterality Date   CATARACT EXTRACTION, BILATERAL  2015   COLONOSCOPY     TOTAL HIP ARTHROPLASTY Left 07/23/2019   Procedure: TOTAL HIP ARTHROPLASTY ANTERIOR APPROACH;  Surgeon: Ollen GrossAluisio, Frank, Howell;  Location: [facility];  Service: Orthopedics;  Laterality: Left;   WISDOM TOOTH EXTRACTION      Family History  Problem Relation Age of Onset   Heart disease Mother    Alcohol abuse Father    Heart disease Father    Hypertension Sister     Social History   Socioeconomic History   Marital status: Married    Spouse name: Not on file   Number of children: Not on file   Years of education: Not on file   Highest education level: Not on file  Occupational History   Not on file  Tobacco Use   Smoking status: Former    Types: Cigarettes    Start date: 07/24/1964    Quit date: 07/25/1999    Years since quitting: 22.3   Smokeless tobacco: Never  Vaping Use   Vaping Use: Never used  Substance and Sexual Activity   Alcohol use: Yes    Alcohol/week: 14.0 standard drinks    Types: 14 Glasses of wine per week    Comment: heavy alcohol use,  "its been years since ive gone a day without alcholol but i didnt feel sick when i wasnt drinking"    Drug use: No   Sexual activity: Not on file  Other Topics Concern   Not on file  Social History Narrative      hh of 1   Part time [employer] english as a second language    Now retired    [employer] of 1 no pets   G2P2   Social Determinants of Corporate investment banker Strain: Not on file  Food Insecurity: Not on file  Transportation Needs: Not on file  Physical Activity: Not on file  Stress: Not on file  Social Connections: Not on file    Outpatient Medications Prior to Visit  Medication Sig Dispense Refill   acetaminophen (TYLENOL) 650 MG CR  tablet Take 1,300 mg by mouth every 8 (eight) hours as needed for pain.     amLODipine (NORVASC) 10 MG tablet TAKE 1 TABLET(10 MG) BY MOUTH Howell 90 tablet 0   amoxicillin (AMOXIL) 500 MG tablet amoxicillin 500 mg tablet  TAKE 4 TABLETS BY MOUTH 1 HOUR BEFORE DENTAL APPOINTMENT     aspirin EC 325 MG EC tablet Take 1 tablet (325 mg total) by mouth 2 (two) times Howell. 40 tablet 0   buPROPion (WELLBUTRIN XL) 300 MG 24 hr tablet TAKE 1 TABLET(300 MG) BY MOUTH Howell 90 tablet 2   diphenhydrAMINE HCl (ZZZQUIL) 50 MG/30ML LIQD Take 15-30 mLs by mouth at bedtime.     DULoxetine (CYMBALTA) 20 MG capsule TAKE ONE CAPSULE BY MOUTH Howell. DECREASE TO EVERY OTHER DAY AND WEAN AS TOLERATED 90 capsule 1   levothyroxine (SYNTHROID) 88 MCG tablet TAKE 1 TABLET(88 MCG) BY MOUTH Howell 90 tablet 1   losartan (COZAAR) 100 MG tablet TAKE 1 TABLET BY MOUTH Howell 90 tablet 2   rosuvastatin (CRESTOR) 10 MG tablet TAKE 1 TABLET BY MOUTH Howell 90 tablet 2   No facility-administered medications prior to visit.     EXAM:  BP 130/74 (BP Location: Left Arm, Patient Position: Sitting, Cuff Size: Normal)    Pulse 90    Temp 98.4 F (36.9 C) (Oral)    Ht 5' 3.5" (1.613 m)    Wt 172 lb (78 kg)    SpO2 96%    BMI 29.99 kg/m   Body mass index is 29.99 kg/m. Wt Readings from Last 3 Encounters:  11/21/21 172 lb (78 kg)  11/16/20 175 lb (79.4 kg)  06/16/20 174 lb 9.6 oz (79.2 kg)    Physical Exam: Vital signs reviewed ZOX:WRUE is a well-developed well-nourished alert cooperative    who appearsr stated age in no acute distress.  HEENT: normocephalic atraumatic , Eyes: PERRL EOM's full, conjunctiva clear, Ears: no deformity EAC's clear  hearing aids TMs with normal landmarks. Mouth: masked  NECK: supple without masses,  or bruits. Thyroid palp no lesions obvious  CHEST/PULM:  Clear to auscultation and percussion breath sounds equal no wheeze , rales or rhonchi.  Breast: normal by inspection . No dimpling, discharge,  masses, tenderness or discharge . CV: PMI is nondisplaced, S1 S2 no gallops, murmurs, rubs. Peripheral pulses are full without delay.No JVD .  ABDOMEN: Bowel sounds normal nontender  No guard or rebound, no hepato splenomegal no CVA tenderness.  Extremtities:  No clubbing cyanosis or edema, no acute joint swelling or redness no focal atrophy NEURO:  Oriented x3, cranial nerves 3-12 appear to be intact, no obvious focal weakness,gait within normal limits no abnormal reflexes or asymmetrical SKIN: No acute rashes normal turgor, color, no bruising or petechiae. PSYCH: Oriented, good eye contact, no obvious depression anxiety, cognition and judgment appear normal. LN: no cervical axillary inguinal adenopathy  Lab Results  Component Value Date   WBC 5.0 11/21/2021   HGB 13.7 11/21/2021   HCT 41.6 11/21/2021   PLT 340.0 11/21/2021   GLUCOSE 87 11/21/2021   CHOL 256 (H) 11/21/2021   TRIG 64.0 11/21/2021   HDL 107.00 11/21/2021   LDLDIRECT 146.3 09/17/2013   LDLCALC 137 (H) 11/21/2021   ALT 25 11/21/2021   AST 23 11/21/2021   NA 137 11/21/2021   K 4.5 11/21/2021   CL 102 11/21/2021   CREATININE 0.74 11/21/2021   BUN 16 11/21/2021   CO2 28 11/21/2021   TSH 3.98 11/21/2021   INR 0.8 07/17/2019   HGBA1C 5.5 11/16/2020    BP Readings from Last 3 Encounters:  11/21/21 130/74  11/16/20 121/70  06/16/20 124/64  Dexa -1.07 Nov 2020  FRAX 10-year fracture risk calculator: 12.1 % for any major fracture and 2.7 % for hip fracture. Pharmacologic therapy is recommended if 10 year fracture risk is >20% for any major osteoporotic fracture or >3% for hip fracture.   Lab plan reviewed with patient  fasting   ASSESSMENT AND PLAN:  Discussed the following assessment and plan:    ICD-10-CM   1. Visit for preventive health examination  Z00.00     2. Essential hypertension  I10 Basic metabolic panel    CBC with Differential/Platelet    Hepatic function panel    Lipid panel    TSH    TSH     Lipid panel    Hepatic function panel    CBC with Differential/Platelet    Basic metabolic panel    3. Hyperlipidemia, unspecified hyperlipidemia type  E78.5 Basic metabolic panel    CBC with Differential/Platelet    Hepatic function panel    Lipid panel    TSH    TSH    Lipid panel    Hepatic function panel    CBC with Differential/Platelet    Basic metabolic panel    4. Hypothyroidism, unspecified type  E03.9 Basic metabolic panel    CBC with Differential/Platelet    Hepatic function panel    Lipid panel    TSH    TSH    Lipid panel    Hepatic function panel    CBC with Differential/Platelet    Basic metabolic panel    5. Medication management  Z79.899 Basic metabolic panel    CBC with Differential/Platelet    Hepatic function panel    Lipid panel    TSH    TSH    Lipid panel    Hepatic function panel    CBC with Differential/Platelet    Basic metabolic panel    6. Wears hearing aid  Z97.4      Return for 6-12 months depending .  Patient Care Team: Zinnia Tindall, Neta Mends, Howell as PCP - General Jethro Bolus, Howell as Attending Physician (Ophthalmology) Shea Evans, Howell as Attending Physician (Obstetrics and Gynecology) Patient Instructions  Good to see you today .  Lab today . If all ok then  decide on follow up 6-12 months .  Try to get  some exercise  :check out [employer]  at [address] close to the office.    Neta Mends. Darius Lundberg M.D.

## 2021-11-21 NOTE — Patient Instructions (Addendum)
Good to see you today .  Lab today . If all ok then  decide on follow up 6-12 months .  Try to get some exercise  :check out [employer] [facility]  at [address] close to the office.

## 2021-11-29 NOTE — Progress Notes (Signed)
Lab results in range are normal except cholesterol is 256 however it is mostly good cholesterol at this time. Stay on same medication and attend to healthy eating and lifestyle. If doing well and yearly visit with lab work.

## 2021-12-25 ENCOUNTER — Other Ambulatory Visit: Payer: Self-pay | Admitting: Internal Medicine

## 2022-02-01 ENCOUNTER — Telehealth: Payer: Self-pay | Admitting: Internal Medicine

## 2022-02-01 NOTE — Telephone Encounter (Signed)
Tried calling patient to schedule Medicare Annual Wellness Visit (AWV) either virtually or in office.  ? ?No answer ? ?Last AWV ; 12/07/15 ?please schedule at anytime with [facility] Nurse Health Advisor 1 or 2 ? ? ? ?

## 2022-02-11 ENCOUNTER — Other Ambulatory Visit: Payer: Self-pay | Admitting: Internal Medicine

## 2022-03-09 ENCOUNTER — Telehealth: Payer: Self-pay | Admitting: Internal Medicine

## 2022-03-09 NOTE — Telephone Encounter (Signed)
Left message for patient to call back and schedule Medicare Annual Wellness Visit (AWV) either virtually or in office. Left  my jabber number [phone]   Last AWV ;12/07/15  please schedule at anytime with [facility] Nurse Health Advisor 1 or 2  .  

## 2022-04-13 ENCOUNTER — Other Ambulatory Visit: Payer: Self-pay | Admitting: Internal Medicine

## 2022-04-21 ENCOUNTER — Telehealth: Payer: Self-pay | Admitting: Internal Medicine

## 2022-04-21 NOTE — Telephone Encounter (Signed)
Tried calling patient to  schedule Medicare Annual Wellness Visit (AWV) either virtually or in office. Left  my Zachery Conch number [phone]  No answer    Last AWV 12/07/15 ; please schedule at anytime with [facility] Nurse Health Advisor 1 or 2

## 2022-04-22 ENCOUNTER — Other Ambulatory Visit: Payer: Self-pay | Admitting: Internal Medicine

## 2022-04-24 NOTE — Telephone Encounter (Signed)
Last Ov 11/21/21 Filled 03/01/21 Is it ok to refill?

## 2022-05-09 ENCOUNTER — Other Ambulatory Visit: Payer: Self-pay | Admitting: Internal Medicine

## 2022-05-16 ENCOUNTER — Telehealth: Payer: Self-pay | Admitting: Internal Medicine

## 2022-05-16 NOTE — Telephone Encounter (Signed)
Left message for patient to call back and schedule Medicare Annual Wellness Visit (AWV) either virtually or in office. Left  my Elaine Howell number [phone]   Last AWV 12/17/15  please schedule at anytime with [facility] Nurse Health Advisor 1 or 2

## 2022-06-14 ENCOUNTER — Telehealth: Payer: Self-pay | Admitting: Internal Medicine

## 2022-06-14 NOTE — Telephone Encounter (Signed)
Left message for patient to call back and schedule Medicare Annual Wellness Visit (AWV).   Please offer to do virtually or by telephone.    Please schedule at any time with [facility] One or Two    45 minute appointment for AWVI or in office 30 minute appointment for AWVS  If any questions, please contact me at [phone]

## 2022-06-27 ENCOUNTER — Telehealth: Payer: Self-pay | Admitting: Internal Medicine

## 2022-06-27 NOTE — Telephone Encounter (Signed)
Left message for patient to call back and schedule Medicare Annual Wellness Visit (AWV) either virtually or in office. Left  my Elaine Howell number [phone]   Last AWV ;12/07/15  please schedule at anytime with [facility] Nurse Health Advisor 1 or 2  .

## 2022-06-30 ENCOUNTER — Ambulatory Visit (INDEPENDENT_AMBULATORY_CARE_PROVIDER_SITE_OTHER): Payer: Medicare Other | Admitting: *Deleted

## 2022-06-30 DIAGNOSIS — Z23 Encounter for immunization: Secondary | ICD-10-CM

## 2022-07-06 ENCOUNTER — Encounter: Payer: Self-pay | Admitting: Family Medicine

## 2022-07-06 ENCOUNTER — Telehealth (INDEPENDENT_AMBULATORY_CARE_PROVIDER_SITE_OTHER): Payer: Medicare Other | Admitting: Family Medicine

## 2022-07-06 VITALS — Wt 170.0 lb

## 2022-07-06 DIAGNOSIS — U071 COVID-19: Secondary | ICD-10-CM

## 2022-07-06 MED ORDER — NIRMATRELVIR/RITONAVIR (PAXLOVID)TABLET: 3.0000 | ORAL_TABLET | Freq: Two times a day (BID) | ORAL | 0 refills | Status: AC

## 2022-07-06 MED ORDER — BENZONATATE 100 MG PO CAPS: ORAL_CAPSULE | ORAL | 0 refills | Status: DC

## 2022-07-06 NOTE — Progress Notes (Signed)
Virtual Visit via Telephone Note  I connected with Elaine Howell on 07/06/22 at  4:00 PM EDT by telephone and verified that I am speaking with the correct person using two identifiers.   I discussed the limitations of performing an evaluation and management service by telephone and requested permission for a phone visit. The patient expressed understanding and agreed to proceed.  Location patient:  [place] Location provider: work or home office Participants present for the call: patient, provider Patient did not have a visit with me in the prior 7 days to address this/these issue(s).   History of Present Illness:  Acute telemedicine visit for Covid19: -Onset:yesterday -Symptoms include: nasal congestion, cough, feels tired, glands in neck sore -Denies:fever, CP, SOB, vomiting, diarrhea -drinking fluids, able to get up and down out of bed -Pertinent past medical history: see below, thinks has had covid once in the past, GFR was 77 in the last year -Pertinent medication allergies:  has had vaccines and multiple boosters Allergies  Allergen Reactions   Lisinopril Cough  -COVID-19 vaccine status: Immunization History  Administered Date(s) Administered   Fluad Quad(high Dose 65+) 07/05/2019, 06/16/2020, 07/27/2021, 06/30/2022   Influenza Split 06/28/2012   Influenza Whole 07/27/2010   Influenza, High Dose Seasonal PF 07/24/2017, 08/01/2018   Influenza,inj,Quad PF,6+ Mos 08/21/2013, 06/23/2016   Influenza-Unspecified 06/30/2015, 07/19/2017   Moderna Sars-Covid-2 Vaccination 10/13/2019, 11/10/2019, 08/04/2020   Pneumococcal Conjugate-13 10/20/2014   Pneumococcal Polysaccharide-23 07/27/2010   Td 10/03/2003, 10/20/2014   Zoster, Live 07/27/2010      Past Medical History:  Diagnosis Date   Chronic insomnia    Depression    Heavy alcohol use 07/27/2010   Qualifier: Diagnosis of  By: Fabian Sharp MD, Neta Mends Daily use      Hyperlipidemia    Hypertension    INSOMNIA UNSPECIFIED  10/29/2007   Qualifier: Diagnosis of  By: Fabian Sharp MD, Neta Mends    LIVER FUNCTION TESTS, ABNORMAL 12/21/2008   Qualifier: Diagnosis of  By: Fabian Sharp MD, Neta Mends    Thyroiditis    positive antibodies    Current Outpatient Medications on File Prior to Visit  Medication Sig Dispense Refill   acetaminophen (TYLENOL) 650 MG CR tablet Take 1,300 mg by mouth every 8 (eight) hours as needed for pain.     amLODipine (NORVASC) 10 MG tablet TAKE 1 TABLET(10 MG) BY MOUTH DAILY 90 tablet 1   amoxicillin (AMOXIL) 500 MG tablet amoxicillin 500 mg tablet  TAKE 4 TABLETS BY MOUTH 1 HOUR BEFORE DENTAL APPOINTMENT     aspirin EC 325 MG EC tablet Take 1 tablet (325 mg total) by mouth 2 (two) times daily. 40 tablet 0   buPROPion (WELLBUTRIN XL) 300 MG 24 hr tablet TAKE 1 TABLET(300 MG) BY MOUTH DAILY 90 tablet 1   diphenhydrAMINE HCl (ZZZQUIL) 50 MG/30ML LIQD Take 15-30 mLs by mouth at bedtime.     DULoxetine (CYMBALTA) 20 MG capsule TAKE ONE CAPSULE BY MOUTH DAILY. DECREASE TO EVERY OTHER DAY AND WEAN AS TOLERATED 90 capsule 1   levothyroxine (SYNTHROID) 88 MCG tablet TAKE 1 TABLET(88 MCG) BY MOUTH DAILY 90 tablet 1   losartan (COZAAR) 100 MG tablet TAKE 1 TABLET BY MOUTH DAILY 90 tablet 2   rosuvastatin (CRESTOR) 10 MG tablet Take 1 tablet (10 mg total) by mouth daily. 90 tablet 1   No current facility-administered medications on file prior to visit.    Observations/Objective: Patient sounds cheerful and well on the phone. I do not appreciate any SOB. Speech  and thought processing are grossly intact. Patient reported vitals:  Assessment and Plan:  COVID-19   Discussed treatment options, side effect and risk of drug interactions, ideal treatment window, potential complications, isolation and precautions for COVID-19.  Discussed possibility of rebound with or without antivirals. Checked for/reviewed last GFR - listed in HPI if available. After lengthy discussion, the patient opted for treatment with Paxlovid  due to being higher risk for complications of covid or severe disease and other factors. Discussed EUA status of this drug and the fact that there is preliminary limited knowledge of risks/interactions/side effects per EUA document vs possible benefits and precautions. She agrees to hold statin for 8 days and cut amlodipine dose in half while on paxlovid. She is aware of othe rpotential interactions as well and will monitor for any issues.This information was shared with patient during the visit and also was provided in patient instructions. Also, advised that patient discuss risks/interactions and use with pharmacist/treatment team as well. Tessalon Rx sent.  Other symptomatic care measures summarized in patient instructions.   Advised to seek prompt virtual visit or in person care if worsening, new symptoms arise, or if is not improving with treatment as expected per our conversation of expected course. Discussed options for follow up care. Did let this patient know that I do telemedicine on Tuesdays and Thursdays for [place] and those are the days I am logged into the system. Advised to schedule follow up visit with PCP, [facility] [facility] virtual visits or [facility] if any further questions or concerns to avoid delays in care.   I discussed the assessment and treatment plan with the patient. The patient was provided an opportunity to ask questions and all were answered. The patient agreed with the plan and demonstrated an understanding of the instructions.    Follow Up Instructions:  I did not refer this patient for an OV with me in the next 24 hours for this/these issue(s).  I discussed the assessment and treatment plan with the patient. The patient was provided an opportunity to ask questions and all were answered. The patient agreed with the plan and demonstrated an understanding of the instructions.   I spent 17 minutes on the date of this visit in the care of this patient. See summary of tasks completed to  properly care for this patient in the detailed notes above which also included counseling of above, review of PMH, medications, allergies, evaluation of the patient and ordering and/or  instructing patient on testing and care options.     Lucretia Kern, DO

## 2022-07-06 NOTE — Patient Instructions (Addendum)
HOME CARE TIPS:   -I sent the medication(s) we discussed to your pharmacy: Meds ordered this encounter  Medications   nirmatrelvir/ritonavir EUA (PAXLOVID) 20 x 150 MG & 10 x 100MG  TABS    Sig: Take 3 tablets by mouth 2 (two) times daily for 5 days. (Take nirmatrelvir 150 mg two tablets twice daily for 5 days and ritonavir 100 mg one tablet twice daily for 5 days) Patient GFR is > 60 7 months ago    Dispense:  30 tablet    Refill:  0   benzonatate (TESSALON PERLES) 100 MG capsule    Sig: 1-2 capsules up to twice daily as needed for cough    Dispense:  30 capsule    Refill:  0     -I sent in the Covid19 treatment or referral you requested per our discussion. Please see the information provided below and discuss further with the pharmacist/treatment team.  -If taking Paxlovid, please review all medications, supplement and over the counter drugs with your pharmacist and ask them to check for any interactions. Please make the following changes to your regular medications while taking Paxlovid: *HOLD your rosuvastatin for 8 days, restart 3 days after finishing the Paxlovid *cut your amlodipine dose in half while taking the Paxlovid (1/2 tablet daily) *monitor for any signs of low blood pressure or worsening mood or concerns and seek medication care promptly if any should develop  -there is a chance of rebound illness with covid after improving. This can happen whether or not you take an antiviral treatment. If you become sick again with covid after getting better, please schedule a follow up virtual visit and isolate again.  -can use tylenol or aleve if needed for fevers, aches and pains per instructions  -nasal saline sinus rinses twice daily  -stay hydrated, drink plenty of fluids and eat small healthy meals - avoid dairy  -follow up with your doctor in 2-3 days unless improving and feeling better  -stay home while sick, except to seek medical care. If you have COVID19, you will likely  be contagious for 7-10 days. Flu or Influenza is likely contagious for about 7 days. Other respiratory viral infections remain contagious for 5-10+ days depending on the virus and many other factors. Wear a good mask that fits snugly (such as N95 or KN95) if around others to reduce the risk of transmission.  It was nice to meet you today, and I really hope you are feeling better soon. I help [facility] out with telemedicine visits on Tuesdays and Thursdays and am happy to help if you need a follow up virtual visit on those days. Otherwise, if you have any concerns or questions following this visit please schedule a follow up visit with your Primary Care doctor or seek care at a local urgent care clinic to avoid delays in care.    Seek in person care or schedule a follow up video visit promptly if your symptoms worsen, new concerns arise or you are not improving with treatment. Call 911 and/or seek emergency care if your symptoms are severe or life threatening.    See the following link for the most recent information regarding Paxlovid:  [email or web address].com   Nirmatrelvir; Ritonavir Tablets What is this medication? NIRMATRELVIR; RITONAVIR (NIR ma TREL vir; ri TOE na veer) treats mild to moderate COVID-19. It may help people who are at high risk of developing severe illness. This medication works by limiting the spread of the virus in your body. The FDA has  allowed the emergency use of this medication. This medicine may be used for other purposes; ask your health care provider or pharmacist if you have questions. COMMON BRAND NAME(S): PAXLOVID What should I tell my care team before I take this medication? They need to know if you have any of these conditions: Any allergies Any serious illness Kidney disease Liver disease An unusual or allergic reaction to nirmatrelvir, ritonavir, other medications, foods, dyes, or preservatives Pregnant or trying to get pregnant Breast-feeding How should I  use this medication? This product contains 2 different medications that are packaged together. For the standard dose, take 2 pink tablets of nirmatrelvir with 1 white tablet of ritonavir (3 tablets total) by mouth with water twice daily. Talk to your care team if you have kidney disease. You may need a different dose. Swallow the tablets whole. You can take it with or without food. If it upsets your stomach, take it with food. Take all of this medication unless your care team tells you to stop it early. Keep taking it even if you think you are better. Talk to your care team about the use of this medication in children. While it may be prescribed for children as young as 12 years for selected conditions, precautions do apply. Overdosage: If you think you have taken too much of this medicine contact a poison control center or emergency room at once. NOTE: This medicine is only for you. Do not share this medicine with others. What if I miss a dose? If you miss a dose, take it as soon as you can unless it is more than 8 hours late. If it is more than 8 hours late, skip the missed dose. Take the next dose at the normal time. Do not take extra or 2 doses at the same time to make up for the missed dose. What may interact with this medication? Do not take this medication with any of the following medications: Alfuzosin Certain medications for anxiety or sleep like midazolam, triazolam Certain medications for cancer like apalutamide, enzalutamide Certain medications for cholesterol like lovastatin, simvastatin Certain medications for irregular heart beat like amiodarone, dronedarone, flecainide, propafenone, quinidine Certain medications for pain like meperidine, piroxicam Certain medications for psychotic disorders like clozapine, lurasidone, pimozide Certain medications for seizures like carbamazepine, phenobarbital, phenytoin Colchicine Eletriptan Eplerenone Ergot alkaloids like dihydroergotamine,  ergonovine, ergotamine, methylergonovine Finerenone Flibanserin Ivabradine Lomitapide Naloxegol Ranolazine Rifampin Sildenafil Silodosin St. John's Wort Tolvaptan Ubrogepant Voclosporin This medication may also interact with the following medications: Bedaquiline Birth control pills Bosentan Certain antibiotics like erythromycin or clarithromycin Certain medications for blood pressure like amlodipine, diltiazem, felodipine, nicardipine, nifedipine Certain medications for cancer like abemaciclib, ceritinib, dasatinib, encorafenib, ibrutinib, ivosidenib, neratinib, nilotinib, venetoclax, vinblastine, vincristine Certain medications for cholesterol like atorvastatin, rosuvastatin Certain medications for depression like bupropion, trazodone Certain medications for fungal infections like isavuconazonium, itraconazole, ketoconazole, voriconazole Certain medications for hepatitis C like elbasvir; grazoprevir, dasabuvir; ombitasvir; paritaprevir; ritonavir, glecaprevir; pibrentasvir, sofosbuvir; velpatasvir; voxilaprevir Certain medications for HIV or AIDS Certain medications for irregular heartbeat like lidocaine Certain medications that treat or prevent blood clots like rivaroxaban, warfarin Digoxin Fentanyl Medications that lower your chance of fighting infection like cyclosporine, sirolimus, tacrolimus Methadone Quetiapine Rifabutin Salmeterol Steroid medications like betamethasone, budesonide, ciclesonide, dexamethasone, fluticasone, methylprednisone, mometasone, triamcinolone This list may not describe all possible interactions. Give your health care provider a list of all the medicines, herbs, non-prescription drugs, or dietary supplements you use. Also tell them if you smoke, drink alcohol, or use illegal  drugs. Some items may interact with your medicine. What should I watch for while using this medication? Your condition will be monitored carefully while you are receiving this  medication. Visit your care team for regular checkups. Tell your care team if your symptoms do not start to get better or if they get worse. If you have untreated HIV infection, this medication may lead to some HIV medications not working as well in the future. Birth control may not work properly while you are taking this medication. Talk to your care team about using an extra method of birth control. What side effects may I notice from receiving this medication? Side effects that you should report to your care team as soon as possible: Allergic reactions--skin rash, itching, hives, swelling of the face, lips, tongue, or throat Liver injury--right upper belly pain, loss of appetite, nausea, light-colored stool, dark yellow or brown urine, yellowing skin or eyes, unusual weakness or fatigue Redness, blistering, peeling, or loosening of the skin, including inside the mouth Side effects that usually do not require medical attention (report these to your care team if they continue or are bothersome): Change in taste Diarrhea General discomfort and fatigue Increase in blood pressure Muscle pain Nausea Stomach pain This list may not describe all possible side effects. Call your doctor for medical advice about side effects. You may report side effects to FDA at 1-800-FDA-1088. Where should I keep my medication? Keep out of the reach of children and pets. Store at room temperature between 20 and 25 degrees C (68 and 77 degrees F). Get rid of any unused medication after the expiration date. To get rid of medications that are no longer needed or have expired: Take the medication to a medication take-back program. Check with your pharmacy or law enforcement to find a location. If you cannot return the medication, check the label or package insert to see if the medication should be thrown out in the garbage or flushed down the toilet. If you are not sure, ask your care team. If it is safe to put it in the  trash, take the medication out of the container. Mix the medication with cat litter, dirt, coffee grounds, or other unwanted substance. Seal the mixture in a bag or container. Put it in the trash. NOTE: This sheet is a summary. It may not cover all possible information. If you have questions about this medicine, talk to your doctor, pharmacist, or health care provider.  2022 Elsevier/Gold Standard (2021-06-20 00:00:00)

## 2022-07-22 ENCOUNTER — Other Ambulatory Visit: Payer: Self-pay | Admitting: Internal Medicine

## 2022-08-09 ENCOUNTER — Telehealth: Payer: Self-pay | Admitting: Internal Medicine

## 2022-08-09 NOTE — Telephone Encounter (Signed)
Left message for patient to call back and schedule Medicare Annual Wellness Visit (AWV) either virtually or in office. Left  my Zachery Conch number [phone]   Last AWV  12/07/15 please schedule with Nurse Health Adviser   45 min for awv-i and in office appointments 30 min for awv-s  phone/virtual appointments

## 2022-09-13 ENCOUNTER — Telehealth: Payer: Self-pay | Admitting: Internal Medicine

## 2022-09-13 NOTE — Telephone Encounter (Signed)
Left message for patient to call back and schedule Medicare Annual Wellness Visit (AWV) either virtually or in office. Left  my jabber number [phone]   Last AWV  12/07/15 please schedule with Nurse Health Adviser   45 min for awv-i and in office appointments 30 min for awv-s  phone/virtual appointments  

## 2022-10-30 ENCOUNTER — Telehealth: Payer: Self-pay | Admitting: Internal Medicine

## 2022-10-30 NOTE — Telephone Encounter (Signed)
Left message for patient to call back and schedule Medicare Annual Wellness Visit (AWV) either virtually or in office. Left  my Herbie Drape number [phone]   Last AWV   12/07/15 please schedule with Nurse Health Adviser   45 min for awv-i  in office appointments 30 min for awv-s & awv-i phone/virtual appointments

## 2022-11-17 ENCOUNTER — Ambulatory Visit (INDEPENDENT_AMBULATORY_CARE_PROVIDER_SITE_OTHER): Payer: Medicare Other

## 2022-11-17 VITALS — BP 120/60 | Ht 63.5 in | Wt 171.2 lb

## 2022-11-17 DIAGNOSIS — Z0000 Encounter for general adult medical examination without abnormal findings: Secondary | ICD-10-CM | POA: Diagnosis not present

## 2022-11-17 NOTE — Progress Notes (Signed)
Subjective:   Elaine Howell is a 77 y.o. female who presents for Medicare Annual (Subsequent) preventive examination.  Review of Systems     Cardiac Risk Factors include: advanced age (>51mn, >>80women);hypertension     Objective:    Today's Vitals   11/17/22 1409  BP: 120/60  Weight: 171 lb 3.2 oz (77.7 kg)  Height: 5' 3.5" (1.613 m)   Body mass index is 29.85 kg/m.     11/17/2022    2:18 PM 07/23/2019    6:25 PM 07/17/2019    1:30 PM 10/12/2014   11:22 AM  Advanced Directives  Does Patient Have a Medical Advance Directive? Yes Yes Yes No  Type of AParamedicof AMuscodaLiving will Living will Living will   Does patient want to make changes to medical advance directive?  No - Patient declined No - Patient declined   Copy of HEmpirein Chart? No - copy requested       Current Medications (verified) Outpatient Encounter Medications as of 11/17/2022  Medication Sig   acetaminophen (TYLENOL) 650 MG CR tablet Take 1,300 mg by mouth every 8 (eight) hours as needed for pain.   amLODipine (NORVASC) 10 MG tablet TAKE 1 TABLET(10 MG) BY MOUTH DAILY   amoxicillin (AMOXIL) 500 MG tablet amoxicillin 500 mg tablet  TAKE 4 TABLETS BY MOUTH 1 HOUR BEFORE DENTAL APPOINTMENT   aspirin EC 325 MG EC tablet Take 1 tablet (325 mg total) by mouth 2 (two) times daily.   benzonatate (TESSALON PERLES) 100 MG capsule 1-2 capsules up to twice daily as needed for cough   buPROPion (WELLBUTRIN XL) 300 MG 24 hr tablet TAKE 1 TABLET(300 MG) BY MOUTH DAILY   diphenhydrAMINE HCl (ZZZQUIL) 50 MG/30ML LIQD Take 15-30 mLs by mouth at bedtime.   DULoxetine (CYMBALTA) 20 MG capsule TAKE ONE CAPSULE BY MOUTH DAILY. DECREASE TO EVERY OTHER DAY AND WEAN AS TOLERATED   levothyroxine (SYNTHROID) 88 MCG tablet TAKE 1 TABLET(88 MCG) BY MOUTH DAILY   losartan (COZAAR) 100 MG tablet TAKE 1 TABLET BY MOUTH DAILY   rosuvastatin (CRESTOR) 10 MG tablet Take 1 tablet  (10 mg total) by mouth daily.   No facility-administered encounter medications on file as of 11/17/2022.    Allergies (verified) Lisinopril   History: Past Medical History:  Diagnosis Date   Chronic insomnia    Depression    Heavy alcohol use 07/27/2010   Qualifier: Diagnosis of  By: PRegis BillMD, WStandley BrookingDaily use      Hyperlipidemia    Hypertension    INSOMNIA UNSPECIFIED 10/29/2007   Qualifier: Diagnosis of  By: PRegis BillMD, WStandley Brooking   LIVER FUNCTION TESTS, ABNORMAL 12/21/2008   Qualifier: Diagnosis of  By: PRegis BillMD, WStandley Brooking   Thyroiditis    positive antibodies   Past Surgical History:  Procedure Laterality Date   CATARACT EXTRACTION, BILATERAL  2015   COLONOSCOPY     TOTAL HIP ARTHROPLASTY Left 07/23/2019   Procedure: TOTAL HIP ARTHROPLASTY ANTERIOR APPROACH;  Surgeon: AGaynelle Arabian MD;  Location: [facility];  Service: Orthopedics;  Laterality: Left;   WISDOM TOOTH EXTRACTION     Family History  Problem Relation Age of Onset   Heart disease Mother    Alcohol abuse Father    Heart disease Father    Hypertension Sister    Social History   Socioeconomic History   Marital status: Widowed    Spouse name: Not on file  Number of children: Not on file   Years of education: Not on file   Highest education level: Not on file  Occupational History   Not on file  Tobacco Use   Smoking status: Former    Types: Cigarettes    Start date: 07/24/1964    Quit date: 07/25/1999    Years since quitting: 23.3   Smokeless tobacco: Never  Vaping Use   Vaping Use: Never used  Substance and Sexual Activity   Alcohol use: Yes    Alcohol/week: 14.0 standard drinks of alcohol    Types: 14 Glasses of wine per week    Comment: heavy alcohol use,  "its been years since ive gone a day without alcholol but i didnt feel sick when i wasnt drinking"    Drug use: No   Sexual activity: Not on file  Other Topics Concern   Not on file  Social History Narrative      hh of 1   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    Now retired    [facility] of 1 no pets   G2P2   Social Determinants of Health   Financial Resource Strain: Low Risk  (11/17/2022)   Overall Financial Resource Strain (CARDIA)    Difficulty of Paying Living Expenses: Not hard at all  Food Insecurity: No Food Insecurity (11/17/2022)   Hunger Vital Sign    Worried About Running Out of Food in the Last Year: Never true    Ran Out of Food in the Last Year: Never true  Transportation Needs: No Transportation Needs (11/17/2022)   PRAPARE - Hydrologist (Medical): No    Lack of Transportation (Non-Medical): No  Physical Activity: Insufficiently Active (11/17/2022)   Exercise Vital Sign    Days of Exercise per Week: 2 days    Minutes of Exercise per Session: 30 min  Stress: No Stress Concern Present (11/17/2022)   [place]    Feeling of Stress : Not at all  Social Connections: Moderately Integrated (11/17/2022)   Social Connection and Isolation Panel [NHANES]    Frequency of Communication with Friends and Family: More than three times a week    Frequency of Social Gatherings with Friends and Family: More than three times a week    Attends Religious Services: More than 4 times per year    Active Member of [employer] or Organizations: Yes    Attends Archivist Meetings: More than 4 times per year    Marital Status: Widowed    Tobacco Counseling Counseling given: Not Answered   Clinical Intake:  Pre-visit preparation completed: Yes  Pain : No/denies pain     BMI - recorded: 29.85 Nutritional Status: BMI 25 -29 Overweight Nutritional Risks: None Diabetes: No  How often do you need to have someone help you when you read instructions, pamphlets, or other written materials from your doctor or pharmacy?: 1 - Never  Diabetic?  No  Interpreter Needed?: No  Information entered by :: Rolene Arbour  LPN   Activities of Daily Living    11/17/2022    2:16 PM  In your present state of health, do you have any difficulty performing the following activities:  Hearing? 1  Comment Wears hearing aids  Vision? 0  Difficulty concentrating or making decisions? 0  Walking or climbing stairs? 0  Dressing or bathing? 0  Doing errands, shopping? 0  Preparing Food and eating ?  N  Using the Toilet? N  In the past six months, have you accidently leaked urine? N  Do you have problems with loss of bowel control? N  Managing your Medications? N  Managing your Finances? N  Housekeeping or managing your Housekeeping? N    Patient Care Team: Panosh, Standley Brooking, MD as PCP - General Rutherford Guys, MD as Attending Physician (Ophthalmology) Azucena Fallen, MD as Attending Physician (Obstetrics and Gynecology)  Indicate any recent Medical Services you may have received from other than [facility] providers in the past year (date may be approximate).     Assessment:   This is a routine wellness examination for [place].  Hearing/Vision screen Hearing Screening - Comments:: Wears hearing aids Vision Screening - Comments:: Wears reading glasses - up to date with routine eye exams with  Dr Gershon Crane  Dietary issues and exercise activities discussed: Exercise limited by: None identified   Goals Addressed               This Visit's Progress     Increase physical activity (pt-stated)        Lose weight. Join more social groups.       Depression Screen    11/17/2022    2:15 PM 11/21/2021    2:06 PM 11/16/2020    2:07 PM 03/11/2019    1:10 PM 02/27/2018   10:20 AM 03/14/2017   11:04 AM 12/07/2015    1:53 PM  PHQ 2/9 Scores  PHQ - 2 Score 0 0 0 0 0 0 0  PHQ- 9 Score  0 3        Fall Risk    11/17/2022    2:16 PM 11/21/2021    2:06 PM 02/27/2018   10:20 AM 03/14/2017   11:04 AM 12/07/2015    1:53 PM  Fall Risk   Falls in the past year? 1 1 No No No  Number falls in past yr: 0 0     Injury with  Fall? 0 0     Comment No medical attention needed      Risk for fall due to : No Fall Risks No Fall Risks     Follow up Falls prevention discussed Falls evaluation completed       [place]:  Any stairs in or around the home? Yes  If so, are there any without handrails? No  Home free of loose throw rugs in walkways, pet beds, electrical cords, etc? Yes  Adequate lighting in your home to reduce risk of falls? Yes   ASSISTIVE DEVICES UTILIZED TO PREVENT FALLS:  Life alert? No  Use of a cane, walker or w/c? No  Grab bars in the bathroom? No  Shower chair or bench in shower? No  Elevated toilet seat or a handicapped toilet? No   TIMED UP AND GO:  Was the test performed? Yes .  Length of time to ambulate 10 feet: 10 sec.   Gait steady and fast without use of assistive device  Cognitive Function:        11/17/2022    2:18 PM  6CIT Screen  What Year? 0 points  What month? 0 points  What time? 0 points  Count back from 20 0 points  Months in reverse 0 points  Repeat phrase 2 points  Total Score 2 points    Immunizations Immunization History  Administered Date(s) Administered   Fluad Quad(high Dose 65+) 07/05/2019, 06/16/2020, 07/27/2021, 06/30/2022   Influenza Split  06/28/2012   Influenza Whole 07/27/2010   Influenza, High Dose Seasonal PF 07/24/2017, 08/01/2018   Influenza,inj,Quad PF,6+ Mos 08/21/2013, 06/23/2016   Influenza-Unspecified 06/30/2015, 07/19/2017   Moderna Sars-Covid-2 Vaccination 10/13/2019, 11/10/2019, 08/04/2020   Pneumococcal Conjugate-13 10/20/2014   Pneumococcal Polysaccharide-23 07/27/2010   Td 10/03/2003, 10/20/2014   Zoster, Live 07/27/2010    TDAP status: Up to date  Flu Vaccine status: Up to date  Pneumococcal vaccine status: Up to date  Covid-19 vaccine status: Completed vaccines  Qualifies for Shingles Vaccine? Yes   Zostavax completed Yes   Shingrix Completed?: Yes  Screening Tests Health  Maintenance  Topic Date Due   HEMOGLOBIN A1C  05/16/2021   Diabetic kidney evaluation - eGFR measurement  11/21/2022   OPHTHALMOLOGY EXAM  11/17/2022 (Originally 11/03/1955)   Diabetic kidney evaluation - Urine ACR  11/18/2022 (Originally 11/03/1963)   FOOT EXAM  11/18/2022 (Originally 11/03/1955)   COVID-19 Vaccine (4 - 2023-24 season) 12/03/2022 (Originally 06/02/2022)   Medicare Annual Wellness (AWV)  11/18/2023   DTaP/Tdap/Td (3 - Tdap) 10/20/2024   Pneumonia Vaccine 78+ Years old  Completed   INFLUENZA VACCINE  Completed   DEXA SCAN  Completed   Hepatitis C Screening  Completed   HPV VACCINES  Aged Out   COLONOSCOPY (Pts 45-96yr Insurance coverage will need to be confirmed)  Discontinued   Zoster Vaccines- Shingrix  Discontinued    Health Maintenance  Health Maintenance Due  Topic Date Due   HEMOGLOBIN A1C  05/16/2021   Diabetic kidney evaluation - eGFR measurement  11/21/2022    Colorectal cancer screening: No longer required.   Mammogram status: No longer required due to Age.  Bone Density status: Completed 11/22/20. Results reflect: Bone density results: OSTEOPOROSIS. Repeat every   years.  Lung Cancer Screening: (Low Dose CT Chest recommended if Age 79-80years, 30 pack-year currently smoking OR have quit w/in 15years.) does not qualify.     Additional Screening:  Hepatitis C Screening: does qualify; Completed 09/02/08  Vision Screening: Recommended annual ophthalmology exams for early detection of glaucoma and other disorders of the eye. Is the patient up to date with their annual eye exam?  Yes  Who is the provider or what is the name of the office in which the patient attends annual eye exams? Dr SGershon CraneIf pt is not established with a provider, would they like to be referred to a provider to establish care? No .   Dental Screening: Recommended annual dental exams for proper oral hygiene  Community Resource Referral / Chronic Care Management:  CRR required this  visit?  No   CCM required this visit?  No      Plan:     I have personally reviewed and noted the following in the patient's chart:   Medical and social history Use of alcohol, tobacco or illicit drugs  Current medications and supplements including opioid prescriptions. Patient is not currently taking opioid prescriptions. Functional ability and status Nutritional status Physical activity Advanced directives List of other physicians Hospitalizations, surgeries, and ER visits in previous 12 months Vitals Screenings to include cognitive, depression, and falls Referrals and appointments  In addition, I have reviewed and discussed with patient certain preventive protocols, quality metrics, and best practice recommendations. A written personalized care plan for preventive services as well as general preventive health recommendations were provided to patient.     BCriselda Peaches LPN   [id number]  Nurse Notes: Patient due Hemoglobin A1C and Diabetic kidney evaluation- Urine ACR

## 2022-11-17 NOTE — Patient Instructions (Addendum)
[name] , Thank you for taking time to come for your Medicare Wellness Visit. I appreciate your ongoing commitment to your health goals. Please review the following plan we discussed and let me know if I can assist you in the future.   These are the goals we discussed:  Goals       Increase physical activity (pt-stated)      Lose weight. Join more social groups.        This is a list of the screening recommended for you and due dates:  Health Maintenance  Topic Date Due   Hemoglobin A1C  05/16/2021   Yearly kidney function blood test for diabetes  11/21/2022   Eye exam for diabetics  11/17/2022*   Yearly kidney health urinalysis for diabetes  11/18/2022*   Complete foot exam   11/18/2022*   COVID-19 Vaccine (4 - 2023-24 season) 12/03/2022*   Medicare Annual Wellness Visit  11/18/2023   DTaP/Tdap/Td vaccine (3 - Tdap) 10/20/2024   Pneumonia Vaccine  Completed   Flu Shot  Completed   DEXA scan (bone density measurement)  Completed   Hepatitis C Screening: USPSTF Recommendation to screen - Ages 67-79 yo.  Completed   HPV Vaccine  Aged Out   Colon Cancer Screening  Discontinued   Zoster (Shingles) Vaccine  Discontinued  *Topic was postponed. The date shown is not the original due date.    Advanced directives: Please bring a copy of your health care power of attorney and living will to the office to be added to your chart at your convenience.   Conditions/risks identified: None  Next appointment: Follow up in one year for your annual wellness visit     Preventive Care 65 Years and Older, Female Preventive care refers to lifestyle choices and visits with your health care provider that can promote health and wellness. What does preventive care include? A yearly physical exam. This is also called an annual well check. Dental exams once or twice a year. Routine eye exams. Ask your health care provider how often you should have your eyes checked. Personal lifestyle choices,  including: Daily care of your teeth and gums. Regular physical activity. Eating a healthy diet. Avoiding tobacco and drug use. Limiting alcohol use. Practicing safe sex. Taking low-dose aspirin every day. Taking vitamin and mineral supplements as recommended by your health care provider. What happens during an annual well check? The services and screenings done by your health care provider during your annual well check will depend on your age, overall health, lifestyle risk factors, and family history of disease. Counseling  Your health care provider may ask you questions about your: Alcohol use. Tobacco use. Drug use. Emotional well-being. Home and relationship well-being. Sexual activity. Eating habits. History of falls. Memory and ability to understand (cognition). Work and work Statistician. Reproductive health. Screening  You may have the following tests or measurements: Height, weight, and BMI. Blood pressure. Lipid and cholesterol levels. These may be checked every 5 years, or more frequently if you are over 17 years old. Skin check. Lung cancer screening. You may have this screening every year starting at age 72 if you have a 30-pack-year history of smoking and currently smoke or have quit within the past 15 years. Fecal occult blood test (FOBT) of the stool. You may have this test every year starting at age 2. Flexible sigmoidoscopy or colonoscopy. You may have a sigmoidoscopy every 5 years or a colonoscopy every 10 years starting at age 38. Hepatitis  C blood test. Hepatitis B blood test. Sexually transmitted disease (STD) testing. Diabetes screening. This is done by checking your blood sugar (glucose) after you have not eaten for a while (fasting). You may have this done every 1-3 years. Bone density scan. This is done to screen for osteoporosis. You may have this done starting at age 72. Mammogram. This may be done every 1-2 years. Talk to your health care provider  about how often you should have regular mammograms. Talk with your health care provider about your test results, treatment options, and if necessary, the need for more tests. Vaccines  Your health care provider may recommend certain vaccines, such as: Influenza vaccine. This is recommended every year. Tetanus, diphtheria, and acellular pertussis (Tdap, Td) vaccine. You may need a Td booster every 10 years. Zoster vaccine. You may need this after age 61. Pneumococcal 13-valent conjugate (PCV13) vaccine. One dose is recommended after age 67. Pneumococcal polysaccharide (PPSV23) vaccine. One dose is recommended after age 22. Talk to your health care provider about which screenings and vaccines you need and how often you need them. This information is not intended to replace advice given to you by your health care provider. Make sure you discuss any questions you have with your health care provider. Document Released: 10/15/2015 Document Revised: 06/07/2016 Document Reviewed: 07/20/2015 Elsevier Interactive Patient Education  2017 [place] Prevention in the Home Falls can cause injuries. They can happen to people of all ages. There are many things you can do to make your home safe and to help prevent falls. What can I do on the outside of my home? Regularly fix the edges of walkways and driveways and fix any cracks. Remove anything that might make you trip as you walk through a door, such as a raised step or threshold. Trim any bushes or trees on the path to your home. Use bright outdoor lighting. Clear any walking paths of anything that might make someone trip, such as rocks or tools. Regularly check to see if handrails are loose or broken. Make sure that both sides of any steps have handrails. Any raised decks and porches should have guardrails on the edges. Have any leaves, snow, or ice cleared regularly. Use sand or salt on walking paths during winter. Clean up any spills in  your garage right away. This includes oil or grease spills. What can I do in the bathroom? Use night lights. Install grab bars by the toilet and in the tub and shower. Do not use towel bars as grab bars. Use non-skid mats or decals in the tub or shower. If you need to sit down in the shower, use a plastic, non-slip stool. Keep the floor dry. Clean up any water that spills on the floor as soon as it happens. Remove soap buildup in the tub or shower regularly. Attach bath mats securely with double-sided non-slip rug tape. Do not have throw rugs and other things on the floor that can make you trip. What can I do in the bedroom? Use night lights. Make sure that you have a light by your bed that is easy to reach. Do not use any sheets or blankets that are too big for your bed. They should not hang down onto the floor. Have a firm chair that has side arms. You can use this for support while you get dressed. Do not have throw rugs and other things on the floor that can make you trip. What can I do in the  kitchen? Clean up any spills right away. Avoid walking on wet floors. Keep items that you use a lot in easy-to-reach places. If you need to reach something above you, use a strong step stool that has a grab bar. Keep electrical cords out of the way. Do not use floor polish or wax that makes floors slippery. If you must use wax, use non-skid floor wax. Do not have throw rugs and other things on the floor that can make you trip. What can I do with my stairs? Do not leave any items on the stairs. Make sure that there are handrails on both sides of the stairs and use them. Fix handrails that are broken or loose. Make sure that handrails are as long as the stairways. Check any carpeting to make sure that it is firmly attached to the stairs. Fix any carpet that is loose or worn. Avoid having throw rugs at the top or bottom of the stairs. If you do have throw rugs, attach them to the floor with carpet  tape. Make sure that you have a light switch at the top of the stairs and the bottom of the stairs. If you do not have them, ask someone to add them for you. What else can I do to help prevent falls? Wear shoes that: Do not have high heels. Have rubber bottoms. Are comfortable and fit you well. Are closed at the toe. Do not wear sandals. If you use a stepladder: Make sure that it is fully opened. Do not climb a closed stepladder. Make sure that both sides of the stepladder are locked into place. Ask someone to hold it for you, if possible. Clearly mark and make sure that you can see: Any grab bars or handrails. First and last steps. Where the edge of each step is. Use tools that help you move around (mobility aids) if they are needed. These include: Canes. Walkers. Scooters. Crutches. Turn on the lights when you go into a dark area. Replace any light bulbs as soon as they burn out. Set up your furniture so you have a clear path. Avoid moving your furniture around. If any of your floors are uneven, fix them. If there are any pets around you, be aware of where they are. Review your medicines with your doctor. Some medicines can make you feel dizzy. This can increase your chance of falling. Ask your doctor what other things that you can do to help prevent falls. This information is not intended to replace advice given to you by your health care provider. Make sure you discuss any questions you have with your health care provider. Document Released: 07/15/2009 Document Revised: 02/24/2016 Document Reviewed: 10/23/2014 Elsevier Interactive Patient Education  2017 [employer].

## 2022-11-22 ENCOUNTER — Encounter: Payer: Self-pay | Admitting: Internal Medicine

## 2022-11-22 ENCOUNTER — Ambulatory Visit (INDEPENDENT_AMBULATORY_CARE_PROVIDER_SITE_OTHER): Payer: Medicare Other | Admitting: Internal Medicine

## 2022-11-22 VITALS — BP 138/72 | HR 81 | Temp 97.5°F | Ht 62.25 in | Wt 171.6 lb

## 2022-11-22 DIAGNOSIS — Z0000 Encounter for general adult medical examination without abnormal findings: Secondary | ICD-10-CM

## 2022-11-22 DIAGNOSIS — Z79899 Other long term (current) drug therapy: Secondary | ICD-10-CM | POA: Diagnosis not present

## 2022-11-22 DIAGNOSIS — E785 Hyperlipidemia, unspecified: Secondary | ICD-10-CM | POA: Diagnosis not present

## 2022-11-22 DIAGNOSIS — L309 Dermatitis, unspecified: Secondary | ICD-10-CM

## 2022-11-22 DIAGNOSIS — L989 Disorder of the skin and subcutaneous tissue, unspecified: Secondary | ICD-10-CM

## 2022-11-22 DIAGNOSIS — E039 Hypothyroidism, unspecified: Secondary | ICD-10-CM | POA: Diagnosis not present

## 2022-11-22 DIAGNOSIS — Z974 Presence of external hearing-aid: Secondary | ICD-10-CM

## 2022-11-22 DIAGNOSIS — I10 Essential (primary) hypertension: Secondary | ICD-10-CM | POA: Diagnosis not present

## 2022-11-22 LAB — CBC WITH DIFFERENTIAL/PLATELET
Basophils Absolute: 0 10*3/uL (ref 0.0–0.1)
Basophils Relative: 0.9 % (ref 0.0–3.0)
Eosinophils Absolute: 0.1 10*3/uL (ref 0.0–0.7)
Eosinophils Relative: 3.4 % (ref 0.0–5.0)
HCT: 41.4 % (ref 36.0–46.0)
Hemoglobin: 13.9 g/dL (ref 12.0–15.0)
Lymphocytes Relative: 36.6 % (ref 12.0–46.0)
Lymphs Abs: 1.5 10*3/uL (ref 0.7–4.0)
MCHC: 33.5 g/dL (ref 30.0–36.0)
MCV: 92.3 fl (ref 78.0–100.0)
Monocytes Absolute: 0.4 10*3/uL (ref 0.1–1.0)
Monocytes Relative: 10.4 % (ref 3.0–12.0)
Neutro Abs: 2 10*3/uL (ref 1.4–7.7)
Neutrophils Relative %: 48.7 % (ref 43.0–77.0)
Platelets: 329 10*3/uL (ref 150.0–400.0)
RBC: 4.49 Mil/uL (ref 3.87–5.11)
RDW: 13.8 % (ref 11.5–15.5)
WBC: 4.1 10*3/uL (ref 4.0–10.5)

## 2022-11-22 LAB — HEMOGLOBIN A1C: Hgb A1c MFr Bld: 5.6 % (ref 4.6–6.5)

## 2022-11-22 LAB — TSH: TSH: 0.77 u[IU]/mL (ref 0.35–5.50)

## 2022-11-22 NOTE — Progress Notes (Signed)
Chief Complaint  Patient presents with   Annual Exam    HPI: Patient  Elaine Howell  77 y.o. comes in today for Preventive Health Care visit  and med evaluation   BP losartan 100 amlodipine 10  doing ok THyroid  levo 88  daily HLD: rosuvastatin 10  per day  tolerating Wellbutrin  and low dose cymbalta  not yet off  Concern about hard to lose weight   givein up etoh for lent  except one night.  Was in [place] for 3 mos and more eating . Knee bothersome and harder to exercise but pool exerecise ok  may see ortho Dr Maureen Ralphs    Health Maintenance  Topic Date Due   FOOT EXAM  Never done   OPHTHALMOLOGY EXAM  Never done   Diabetic kidney evaluation - Urine ACR  Never done   HEMOGLOBIN A1C  05/16/2021   Diabetic kidney evaluation - eGFR measurement  11/21/2022   COVID-19 Vaccine (4 - 2023-24 season) 12/03/2022 (Originally 06/02/2022)   Medicare Annual Wellness (AWV)  11/18/2023   DTaP/Tdap/Td (5 - Tdap) 10/20/2024   Pneumonia Vaccine 60+ Years old  Completed   INFLUENZA VACCINE  Completed   DEXA SCAN  Completed   Hepatitis C Screening  Completed   HPV VACCINES  Aged Out   COLONOSCOPY (Pts 45-53yr Insurance coverage will need to be confirmed)  Discontinued   Zoster Vaccines- Shingrix  Discontinued   Health Maintenance Review LIFESTYLE:  Exercise:  not as good  jsut joined [facility]  knees and back  Tobacco/ETS: n Alcohol:   gave up for lent. Except  Sugar beverages:  not reg  Sleep: 7-8  Drug use: no HH of  1 Work:   ROS:  GEN/ HEENT: No fever, significant weight changes sweats headaches vision problems  CV/ PULM; No chest pain shortness of breath cough, syncope,edema  change in exercise tolerance. GI /GU: No adominal pain, vomiting, change in bowel habits. No blood in the stool. No significant GU symptoms. SKIN/HEME: ,no acute skin rashes suspicious lesions or bleeding. No lymphadenopathy, nodules, masses.  NEURO/ PSYCH:  No neurologic signs such as weakness  numbness. No depression anxiety. IMM/ Allergy: No unusual infections.  Allergy .   REST of 12 system review negative except as per HPI   Past Medical History:  Diagnosis Date   Chronic insomnia    Depression    Heavy alcohol use 07/27/2010   Qualifier: Diagnosis of  By: PRegis BillMD, WStandley BrookingDaily use      Hyperlipidemia    Hypertension    INSOMNIA UNSPECIFIED 10/29/2007   Qualifier: Diagnosis of  By: PRegis BillMD, WStandley Brooking   LIVER FUNCTION TESTS, ABNORMAL 12/21/2008   Qualifier: Diagnosis of  By: PRegis BillMD, WStandley Brooking   Thyroiditis    positive antibodies    Past Surgical History:  Procedure Laterality Date   CATARACT EXTRACTION, BILATERAL  2015   COLONOSCOPY     TOTAL HIP ARTHROPLASTY Left 07/23/2019   Procedure: TOTAL HIP ARTHROPLASTY ANTERIOR APPROACH;  Surgeon: AGaynelle Arabian MD;  Location: [facility];  Service: Orthopedics;  Laterality: Left;   WISDOM TOOTH EXTRACTION      Family History  Problem Relation Age of Onset   Heart disease Mother    Alcohol abuse Father    Heart disease Father    Hypertension Sister     Social History   Socioeconomic History   Marital status: Widowed    Spouse name: Not on file  Number of children: Not on file   Years of education: Not on file   Highest education level: Not on file  Occupational History   Not on file  Tobacco Use   Smoking status: Former    Types: Cigarettes    Start date: 07/24/1964    Quit date: 07/25/1999    Years since quitting: 23.3   Smokeless tobacco: Never  Vaping Use   Vaping Use: Never used  Substance and Sexual Activity   Alcohol use: Yes    Alcohol/week: 14.0 standard drinks of alcohol    Types: 14 Glasses of wine per week    Comment: heavy alcohol use,  "its been years since ive gone a day without alcholol but i didnt feel sick when i wasnt drinking"    Drug use: No   Sexual activity: Not on file  Other Topics Concern   Not on file  Social History Narrative      hh of 1   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    Now retired    [facility] of 1 no pets   G2P2   Social Determinants of Health   Financial Resource Strain: Low Risk  (11/17/2022)   Overall Financial Resource Strain (CARDIA)    Difficulty of Paying Living Expenses: Not hard at all  Food Insecurity: No Food Insecurity (11/17/2022)   Hunger Vital Sign    Worried About Running Out of Food in the Last Year: Never true    Ran Out of Food in the Last Year: Never true  Transportation Needs: No Transportation Needs (11/17/2022)   PRAPARE - Hydrologist (Medical): No    Lack of Transportation (Non-Medical): No  Physical Activity: Insufficiently Active (11/17/2022)   Exercise Vital Sign    Days of Exercise per Week: 2 days    Minutes of Exercise per Session: 30 min  Stress: No Stress Concern Present (11/17/2022)   [place]    Feeling of Stress : Not at all  Social Connections: Moderately Integrated (11/17/2022)   Social Connection and Isolation Panel [NHANES]    Frequency of Communication with Friends and Family: More than three times a week    Frequency of Social Gatherings with Friends and Family: More than three times a week    Attends Religious Services: More than 4 times per year    Active Member of [employer] or Organizations: Yes    Attends Archivist Meetings: More than 4 times per year    Marital Status: Widowed    Outpatient Medications Prior to Visit  Medication Sig Dispense Refill   amLODipine (NORVASC) 10 MG tablet TAKE 1 TABLET(10 MG) BY MOUTH DAILY 90 tablet 1   buPROPion (WELLBUTRIN XL) 300 MG 24 hr tablet TAKE 1 TABLET(300 MG) BY MOUTH DAILY 90 tablet 1   diphenhydrAMINE HCl (ZZZQUIL) 50 MG/30ML LIQD Take 15-30 mLs by mouth at bedtime.     DULoxetine (CYMBALTA) 20 MG capsule TAKE ONE CAPSULE BY MOUTH DAILY. DECREASE TO EVERY OTHER DAY AND WEAN AS TOLERATED 90 capsule 1   levothyroxine (SYNTHROID) 88 MCG tablet  TAKE 1 TABLET(88 MCG) BY MOUTH DAILY 90 tablet 1   losartan (COZAAR) 100 MG tablet TAKE 1 TABLET BY MOUTH DAILY 90 tablet 2   rosuvastatin (CRESTOR) 10 MG tablet Take 1 tablet (10 mg total) by mouth daily. 90 tablet 1   acetaminophen (TYLENOL) 650 MG CR tablet Take 1,300 mg by  mouth every 8 (eight) hours as needed for pain.     amoxicillin (AMOXIL) 500 MG tablet amoxicillin 500 mg tablet  TAKE 4 TABLETS BY MOUTH 1 HOUR BEFORE DENTAL APPOINTMENT     aspirin EC 325 MG EC tablet Take 1 tablet (325 mg total) by mouth 2 (two) times daily. 40 tablet 0   benzonatate (TESSALON PERLES) 100 MG capsule 1-2 capsules up to twice daily as needed for cough 30 capsule 0   No facility-administered medications prior to visit.     EXAM:  BP 138/72 (BP Location: Right Arm, Patient Position: Sitting, Cuff Size: Normal)   Pulse 81   Temp (!) 97.5 F (36.4 C) (Oral)   Ht 5' 2.25" (1.581 m)   Wt 171 lb 9.6 oz (77.8 kg)   SpO2 96%   BMI 31.13 kg/m   Body mass index is 31.13 kg/m. Wt Readings from Last 3 Encounters:  11/22/22 171 lb 9.6 oz (77.8 kg)  11/17/22 171 lb 3.2 oz (77.7 kg)  07/06/22 170 lb (77.1 kg)    Physical Exam: Vital signs reviewed [id number] is a well-developed well-nourished alert cooperative    who appearsr stated age in no acute distress.  HEENT: normocephalic atraumatic , Eyes: PERRL EOM's full, conjunctiva clear, Nares: paten,t no deformity discharge or tenderness., Ears: no deformity EAC's clear TMs with normal landmarks. Hearing aids  Mouth: clear OP, no lesions, edema.  Moist mucous membranes. Dentition in adequate repair. NECK: supple without masses,  or bruits. CHEST/PULM:  Clear to auscultation and percussion breath sounds equal no wheeze , rales or rhonchi. No chest wall deformities or tenderness. Breast: normal by inspection . No dimpling, discharge, masses, tenderness or discharge . CV: PMI is nondisplaced, S1 S2 no gallops, murmurs, rubs. Peripheral pulses are full without  delay.No JVD .  ABDOMEN: Bowel sounds normal nontender  No guard or rebound, no hepato splenomegal no CVA tenderness.  No hernia. Extremtities:  No clubbing cyanosis or edema, no acute joint swelling or redness no focal atrophy NEURO:  Oriented x3, cranial nerves 3-12 appear to be intact, no obvious focal weakness,gait within normal limits no abnormal reflexes or asymmetrical SKIN: No acute rashes normal turgor, color, no bruising or petechiae. Right foot redness  around toes and interdigital  painted nails  left  foot  ok  right thigh with 4 mm white round verroucous like lesion  no redness and no capillary seen  PSYCH: Oriented, good eye contact, no obvious depression anxiety, cognition and judgment appear normal. LN: no cervical axillary inguinal adenopathy  Lab Results  Component Value Date   WBC 5.0 11/21/2021   HGB 13.7 11/21/2021   HCT 41.6 11/21/2021   PLT 340.0 11/21/2021   GLUCOSE 87 11/21/2021   CHOL 256 (H) 11/21/2021   TRIG 64.0 11/21/2021   HDL 107.00 11/21/2021   LDLDIRECT 146.3 09/17/2013   LDLCALC 137 (H) 11/21/2021   ALT 25 11/21/2021   AST 23 11/21/2021   NA 137 11/21/2021   K 4.5 11/21/2021   CL 102 11/21/2021   CREATININE 0.74 11/21/2021   BUN 16 11/21/2021   CO2 28 11/21/2021   TSH 3.98 11/21/2021   INR 0.8 07/17/2019   HGBA1C 5.5 11/16/2020    BP Readings from Last 3 Encounters:  11/22/22 138/72  11/17/22 120/60  11/21/21 130/74    Lab plan reviewed with patient   ASSESSMENT AND PLAN:  Discussed the following assessment and plan:    ICD-10-CM   1. Well adult exam  Z00.00  get updated mammo concern abot weight  counsel    2. Hypothyroidism, unspecified type  E03.9 CBC with Differential/Platelet    Comprehensive metabolic panel    Lipid panel    TSH    Hemoglobin A1c   cont med check lab monitoring    3. Hyperlipidemia, unspecified hyperlipidemia type  E78.5 CBC with Differential/Platelet    Comprehensive metabolic panel    Lipid panel     TSH    Hemoglobin A1c   on crestor follow lab    4. Essential hypertension  I10 CBC with Differential/Platelet    Comprehensive metabolic panel    Lipid panel    TSH    Hemoglobin A1c   reasonable control    5. Wears hearing aid  Z97.4     6. Medication management  Z79.899 CBC with Differential/Platelet    Comprehensive metabolic panel    Lipid panel    TSH    Hemoglobin A1c    7. Skin lesion  L98.9    r thigh ? wart vs other verroucous looking lesion would ask derm opinion vs removal rx    8. Dermatitis of right foot  L30.9    on going not responseive to topicals says is athletes foot    Addresss meds and above  Derm referral  Lab   1 year check depending Return in about 1 year (around 11/23/2023) for depending on results.  Patient Care Team: Freddi Schrager, Standley Brooking, MD as PCP - General Rutherford Guys, MD as Attending Physician (Ophthalmology) Azucena Fallen, MD as Attending Physician (Obstetrics and Gynecology) Patient Instructions  Good to see  you today . Stay on same meds for now  although can try weaning off the cymbalta .. BP is ok today . Labs today  Agree with  working on activity  dec calories   as we discussed . [facility] is a Health and safety inspector idea.  Mammogram due this year . Derm referral   Standley Brooking. Jianni Batten M.D.

## 2022-11-22 NOTE — Patient Instructions (Addendum)
Good to see  you today . Stay on same meds for now  although can try weaning off the cymbalta .. BP is ok today . Labs today  Agree with  working on activity  dec calories   as we discussed . [facility] is a Health and safety inspector idea.  Mammogram due this year . Derm referral

## 2022-11-23 LAB — LIPID PANEL
Cholesterol: 191 mg/dL (ref 0–200)
HDL: 82.6 mg/dL (ref >39.00)
LDL Cholesterol: 92 mg/dL (ref 0–99)
NonHDL: 108.58
Total CHOL/HDL Ratio: 2
Triglycerides: 83 mg/dL (ref 0.0–149.0)
VLDL: 16.6 mg/dL (ref 0.0–40.0)

## 2022-11-23 LAB — COMPREHENSIVE METABOLIC PANEL
ALT: 22 U/L (ref 0–35)
AST: 21 U/L (ref 0–37)
Albumin: 4.7 g/dL (ref 3.5–5.2)
Alkaline Phosphatase: 78 U/L (ref 39–117)
BUN: 11 mg/dL (ref 6–23)
CO2: 23 mEq/L (ref 19–32)
Calcium: 10.4 mg/dL (ref 8.4–10.5)
Chloride: 105 mEq/L (ref 96–112)
Creatinine, Ser: 0.66 mg/dL (ref 0.40–1.20)
GFR: 83.65 mL/min (ref >60.00)
Glucose, Bld: 102 mg/dL — ABNORMAL HIGH (ref 70–99)
Potassium: 4.3 mEq/L (ref 3.5–5.1)
Sodium: 141 mEq/L (ref 135–145)
Total Bilirubin: 0.4 mg/dL (ref 0.2–1.2)
Total Protein: 7.4 g/dL (ref 6.0–8.3)

## 2022-11-23 NOTE — Progress Notes (Signed)
Results  are good   thyroid in range  no diabetes although blood sugar is borderline . Continue attention to lifestyle intervention healthy eating and exercise .  Yearly lab  No change in  medications

## 2022-12-13 ENCOUNTER — Other Ambulatory Visit: Payer: Self-pay | Admitting: Internal Medicine

## 2022-12-18 ENCOUNTER — Encounter: Payer: Medicare Other | Admitting: Plastic Surgery

## 2022-12-29 DIAGNOSIS — M170 Bilateral primary osteoarthritis of knee: Secondary | ICD-10-CM | POA: Diagnosis not present

## 2023-01-23 ENCOUNTER — Other Ambulatory Visit: Payer: Self-pay | Admitting: Internal Medicine

## 2023-03-02 ENCOUNTER — Other Ambulatory Visit: Payer: Self-pay | Admitting: Internal Medicine

## 2023-03-11 ENCOUNTER — Other Ambulatory Visit: Payer: Self-pay | Admitting: Internal Medicine

## 2023-06-21 ENCOUNTER — Telehealth: Payer: Self-pay | Admitting: Internal Medicine

## 2023-06-21 NOTE — Telephone Encounter (Signed)
Pt requesting medication to help her sleep, hasn't slept for past 3 days, started when she returned from visiting [place]. Declined OV

## 2023-06-21 NOTE — Telephone Encounter (Signed)
Attempted to reach pt. Left a voice message to call us back.

## 2023-06-23 ENCOUNTER — Other Ambulatory Visit: Payer: Self-pay | Admitting: Internal Medicine

## 2023-06-29 ENCOUNTER — Encounter: Payer: Self-pay | Admitting: Family Medicine

## 2023-06-29 ENCOUNTER — Ambulatory Visit (INDEPENDENT_AMBULATORY_CARE_PROVIDER_SITE_OTHER): Payer: Medicare Other | Admitting: Family Medicine

## 2023-06-29 VITALS — BP 110/68 | HR 78 | Temp 98.3°F | Wt 168.4 lb

## 2023-06-29 DIAGNOSIS — Z23 Encounter for immunization: Secondary | ICD-10-CM | POA: Diagnosis not present

## 2023-06-29 DIAGNOSIS — G4700 Insomnia, unspecified: Secondary | ICD-10-CM

## 2023-06-29 MED ORDER — TEMAZEPAM 30 MG PO CAPS: 30.0000 mg | ORAL_CAPSULE | Freq: Every evening | ORAL | 0 refills | Status: AC | PRN

## 2023-06-29 NOTE — Progress Notes (Signed)
   Subjective:    Patient ID: Elaine Howell, female    DOB: 1946/06/13, 77 y.o.   MRN: [id number]  HPI Here asking for help with insomnia. She typically sleeps well, but one week ago she returned from spending some time in [place], and she has had trouble all week. She says she is not worried about anything, and she feels fine physically.    Review of Systems  Constitutional: Negative.   Respiratory: Negative.    Cardiovascular: Negative.   Psychiatric/Behavioral:  Positive for sleep disturbance. Negative for dysphoric mood. The patient is not nervous/anxious.        Objective:   Physical Exam Constitutional:      Appearance: Normal appearance.  Cardiovascular:     Rate and Rhythm: Normal rate and regular rhythm.     Pulses: Normal pulses.     Heart sounds: Normal heart sounds.  Pulmonary:     Effort: Pulmonary effort is normal.     Breath sounds: Normal breath sounds.  Neurological:     Mental Status: She is alert and oriented to person, place, and time.  Psychiatric:        Mood and Affect: Mood normal.        Behavior: Behavior normal.        Thought Content: Thought content normal.           Assessment & Plan:  Insomnia, sh will try some Temazepam 30 mg as needed at bedtime. Follow up with Dr. Fabian Sharp. Gershon Crane, MD

## 2023-06-29 NOTE — Addendum Note (Signed)
Addended by: Carola Rhine on: 06/29/2023 01:43 PM   Modules accepted: Orders

## 2023-07-11 ENCOUNTER — Ambulatory Visit: Payer: Medicare Other | Admitting: Family Medicine

## 2023-07-11 ENCOUNTER — Encounter: Payer: Self-pay | Admitting: Family Medicine

## 2023-07-11 VITALS — BP 138/80 | HR 82 | Temp 97.5°F | Resp 16 | Ht 62.25 in | Wt 168.2 lb

## 2023-07-11 DIAGNOSIS — J069 Acute upper respiratory infection, unspecified: Secondary | ICD-10-CM

## 2023-07-11 DIAGNOSIS — J040 Acute laryngitis: Secondary | ICD-10-CM

## 2023-07-11 DIAGNOSIS — I10 Essential (primary) hypertension: Secondary | ICD-10-CM

## 2023-07-11 LAB — POCT INFLUENZA A/B
Influenza A, POC: NEGATIVE
Influenza B, POC: NEGATIVE

## 2023-07-11 LAB — POC COVID19 BINAXNOW: SARS Coronavirus 2 Ag: NEGATIVE

## 2023-07-11 NOTE — Patient Instructions (Addendum)
A few things to remember from today's visit:  URI, acute  Laryngitis, acute  Essential hypertension  Monitor blood pressure at home. Monitor for new symptoms. Tylenol 500 mg 3-4 times per day if needed for body aches. Throat lozenges for sore throat. Plenty of fluids.  Do not use My Chart to request refills or for acute issues that need immediate attention. If you send a my chart message, it may take a few days to be addressed, specially if I am not in the office.  Please be sure medication list is accurate. If a new problem present, please set up appointment sooner than planned today.

## 2023-07-11 NOTE — Progress Notes (Unsigned)
ACUTE VISIT Chief Complaint  Patient presents with   Cough   Sore Throat    All symptoms started yesterday, took Covid test yesterday which showed negative (PT does note that she wasn't sure if it was in date or not)   Nasal Congestion   HPI: [name] is a 77 y.o. female with a PMHx significant for HTN, hypothyroidism, OA, HLD, leukopenia, and vitamin D deficiency, who is here today complaining of respiratory symptoms.  Sore Throat  This is a new problem. The current episode started yesterday. The problem has been unchanged. There has been no fever. The pain is mild. Associated symptoms include congestion, coughing and a hoarse voice. Pertinent negatives include no abdominal pain, diarrhea, drooling, ear discharge, ear pain, headaches, plugged ear sensation, neck pain, shortness of breath, stridor, swollen glands, trouble swallowing or vomiting. She has had no exposure to strep or mono. She has tried nothing for the symptoms.   Patient states she woke up yesterday with chest congestion, and has since developed a cough, sore throat, chills, fatigue, and voice changes. She denies headache, nasal congestion, rhinorrhea, SOB, wheezing, fever, body aches, or recent sick contacts.  She mentions she coughs when she breathes deeply.   Her blood pressure is mildly elevated today.  She is currently on amlodipine 10 mg daily and losartan 100 mg daily. She doesn't check it at home.  Lab Results  Component Value Date   NA 141 11/22/2022   CL 105 11/22/2022   K 4.3 11/22/2022   CO2 23 11/22/2022   BUN 11 11/22/2022   CREATININE 0.66 11/22/2022   GFR 83.65 11/22/2022   CALCIUM 10.4 11/22/2022   ALBUMIN 4.7 11/22/2022   GLUCOSE 102 (H) 11/22/2022   Review of Systems  Constitutional:  Positive for activity change, chills and fatigue. Negative for appetite change.  HENT:  Positive for congestion, hoarse voice, postnasal drip and sore throat. Negative for drooling, ear  discharge, ear pain, sinus pain and trouble swallowing.   Respiratory:  Positive for cough. Negative for shortness of breath and stridor.   Cardiovascular:  Negative for chest pain and palpitations.  Gastrointestinal:  Negative for abdominal pain, diarrhea and vomiting.  Genitourinary:  Negative for decreased urine volume, dysuria and hematuria.  Musculoskeletal:  Positive for myalgias. Negative for neck pain.  Skin:  Negative for rash.  Neurological:  Negative for headaches.  Hematological:  Negative for adenopathy. Does not bruise/bleed easily.  See other pertinent positives and negatives in HPI.  Current Outpatient Medications on File Prior to Visit  Medication Sig Dispense Refill   amLODipine (NORVASC) 10 MG tablet TAKE 1 TABLET(10 MG) BY MOUTH DAILY 90 tablet 1   buPROPion (WELLBUTRIN XL) 300 MG 24 hr tablet TAKE 1 TABLET(300 MG) BY MOUTH DAILY 90 tablet 1   diphenhydrAMINE HCl (ZZZQUIL) 50 MG/30ML LIQD Take 15-30 mLs by mouth at bedtime.     levothyroxine (SYNTHROID) 88 MCG tablet TAKE 1 TABLET(88 MCG) BY MOUTH DAILY 90 tablet 1   losartan (COZAAR) 100 MG tablet TAKE 1 TABLET BY MOUTH DAILY 90 tablet 2   rosuvastatin (CRESTOR) 10 MG tablet TAKE 1 TABLET(10 MG) BY MOUTH DAILY 90 tablet 1   temazepam (RESTORIL) 30 MG capsule Take 1 capsule (30 mg total) by mouth at bedtime as needed for sleep. 30 capsule 0   No current facility-administered medications on file prior to visit.   Past Medical History:  Diagnosis Date   Chronic insomnia    Depression  Heavy alcohol use 07/27/2010   Qualifier: Diagnosis of  By: Fabian Sharp MD, Neta Mends Daily use      Hyperlipidemia    Hypertension    INSOMNIA UNSPECIFIED 10/29/2007   Qualifier: Diagnosis of  By: Fabian Sharp MD, Neta Mends    LIVER FUNCTION TESTS, ABNORMAL 12/21/2008   Qualifier: Diagnosis of  By: Fabian Sharp MD, Neta Mends    Thyroiditis    positive antibodies   Allergies  Allergen Reactions   Lisinopril Cough    Social History   Socioeconomic  History   Marital status: Widowed    Spouse name: Not on file   Number of children: Not on file   Years of education: Not on file   Highest education level: Not on file  Occupational History   Not on file  Tobacco Use   Smoking status: Former    Current packs/day: 0.00    Types: Cigarettes    Start date: 07/24/1964    Quit date: 07/25/1999    Years since quitting: 23.9   Smokeless tobacco: Never  Vaping Use   Vaping status: Never Used  Substance and Sexual Activity   Alcohol use: Yes    Alcohol/week: 14.0 standard drinks of alcohol    Types: 14 Glasses of wine per week    Comment: heavy alcohol use,  "its been years since ive gone a day without alcholol but i didnt feel sick when i wasnt drinking"    Drug use: No   Sexual activity: Not on file  Other Topics Concern   Not on file  Social History Narrative      hh of 1   Part time [employer] english as a second language    Now retired    [facility] of 1 no pets   G2P2   Social Determinants of Health   Financial Resource Strain: Low Risk  (11/17/2022)   Overall Financial Resource Strain (CARDIA)    Difficulty of Paying Living Expenses: Not hard at all  Food Insecurity: No Food Insecurity (11/17/2022)   Hunger Vital Sign    Worried About Running Out of Food in the Last Year: Never true    Ran Out of Food in the Last Year: Never true  Transportation Needs: No Transportation Needs (11/17/2022)   PRAPARE - Administrator, Civil Service (Medical): No    Lack of Transportation (Non-Medical): No  Physical Activity: Insufficiently Active (11/17/2022)   Exercise Vital Sign    Days of Exercise per Week: 2 days    Minutes of Exercise per Session: 30 min  Stress: No Stress Concern Present (11/17/2022)   [employer] of Occupational Health - Occupational Stress Questionnaire    Feeling of Stress : Not at all  Social Connections: Moderately Integrated (11/17/2022)   Social Connection and Isolation Panel [NHANES]    Frequency of  Communication with Friends and Family: More than three times a week    Frequency of Social Gatherings with Friends and Family: More than three times a week    Attends Religious Services: More than 4 times per year    Active Member of [employer] or Organizations: Yes    Attends Banker Meetings: More than 4 times per year    Marital Status: Widowed    Vitals:   07/11/23 1422 07/11/23 1439  BP: (!) 142/84 138/80  Pulse: 82   Resp: 16   Temp: (!) 97.5 F (36.4 C)   SpO2: 98%    Body mass index is 30.52 kg/m.  Physical Exam Vitals and nursing note reviewed.  Constitutional:      General: She is not in acute distress.    Appearance: She is well-developed. She is not ill-appearing.  HENT:     Head: Normocephalic and atraumatic.     Right Ear: Tympanic membrane, ear canal and external ear normal.     Left Ear: Tympanic membrane, ear canal and external ear normal.     Nose:     Comments: ***    Mouth/Throat:     Mouth: Mucous membranes are moist.     Pharynx: Posterior oropharyngeal erythema (mild) and postnasal drip present. No pharyngeal swelling or oropharyngeal exudate.     Comments: Dysphonia. Eyes:     Conjunctiva/sclera: Conjunctivae normal.  Cardiovascular:     Rate and Rhythm: Normal rate and regular rhythm.     Heart sounds: No murmur heard. Pulmonary:     Effort: Pulmonary effort is normal. No respiratory distress.     Breath sounds: Normal breath sounds. No stridor.  Musculoskeletal:     Cervical back: No edema or erythema.  Lymphadenopathy:     Head:     Right side of head: No submandibular adenopathy.     Left side of head: No submandibular adenopathy.     Cervical: No cervical adenopathy.  Skin:    General: Skin is warm.     Findings: No erythema or rash.  Neurological:     Mental Status: She is alert and oriented to person, place, and time.  Psychiatric:        Mood and Affect: Mood and affect normal.    ASSESSMENT AND PLAN:  Ms. Letty Salvi  was seen today for respiratory symptoms.   URI, acute Symptoms suggests a viral etiology, I explained patient that symptomatic treatment is usually recommended in this case, so I do not think abx is needed at this time. Instructed to monitor for new symptoms and signs of complications, including new onset of fever among some, clearly instructed about warning signs. I also explained that cough and nasal congestion can get worse and last a few days and sometimes weeks after acute symptoms have resolved. Tylenol 500 mg 3-4 times per day for body aches, throat lozenges for sore throat, rest, and plenty of fluids. F/U as needed.  -     POC COVID-19 [facility] -     POCT Influenza A/B  Laryngitis, acute We discussed diagnosis. Voice stridor recommended. Clearly instructed about warning signs.  Essential hypertension Re-checked and improved. Continue amlodipine and losartan same dose. Recommend monitoring BP at home.  Return if symptoms worsen or fail to improve.  I, Rolla Etienne Wierda, acting as a scribe for Ivyanna Sibert [place], MD., have documented all relevant documentation on the behalf of Shataya Winkles [place], MD, as directed by  Colm Lyford [place], MD while in the presence of Leilanni Halvorson [place], MD.   I, Takyah Ciaramitaro [place], MD, have reviewed all documentation for this visit. The documentation on 07/11/23 for the exam, diagnosis, procedures, and orders are all accurate and complete.  Neely Cecena G. [place], MD  [facility] [place] [facility].

## 2023-07-17 ENCOUNTER — Other Ambulatory Visit: Payer: Self-pay | Admitting: Internal Medicine

## 2023-07-30 ENCOUNTER — Other Ambulatory Visit: Payer: Self-pay | Admitting: Internal Medicine

## 2023-08-29 DIAGNOSIS — M170 Bilateral primary osteoarthritis of knee: Secondary | ICD-10-CM | POA: Diagnosis not present

## 2023-09-24 ENCOUNTER — Other Ambulatory Visit: Payer: Self-pay | Admitting: Family Medicine

## 2023-09-25 NOTE — Telephone Encounter (Signed)
Rxed by Dr Sunday Shams visit with me was 2 24  Please make appt  can be virtual to discuss medication ( since this is a controlled substance)

## 2023-09-27 NOTE — Telephone Encounter (Signed)
Attempted to reach pt. Left a detail message of needing appt and to call back tomorrow.

## 2023-10-02 NOTE — Telephone Encounter (Signed)
Attempted to reach pt. Left a voicemail to call us back.  

## 2023-10-13 ENCOUNTER — Other Ambulatory Visit: Payer: Self-pay | Admitting: Family

## 2023-10-29 ENCOUNTER — Other Ambulatory Visit: Payer: Self-pay | Admitting: Family

## 2023-10-29 ENCOUNTER — Other Ambulatory Visit: Payer: Self-pay | Admitting: Internal Medicine

## 2023-11-07 DIAGNOSIS — N390 Urinary tract infection, site not specified: Secondary | ICD-10-CM | POA: Diagnosis not present

## 2023-11-07 DIAGNOSIS — Z112 Encounter for screening for other bacterial diseases: Secondary | ICD-10-CM | POA: Diagnosis not present

## 2023-11-09 ENCOUNTER — Other Ambulatory Visit: Payer: Self-pay

## 2023-11-09 MED ORDER — ROSUVASTATIN CALCIUM 10 MG PO TABS: ORAL_TABLET | ORAL | 1 refills | Status: DC

## 2023-11-09 NOTE — Telephone Encounter (Signed)
 Contacted pt and schedule an appt as pt is almost due for her yearly check up.   Pt states she is not in town. She is in [place] until March. Advise pt to give us  a call back once she is back in town to schedule an appt.   Pt verbalized understanding.

## 2023-12-12 DIAGNOSIS — B351 Tinea unguium: Secondary | ICD-10-CM | POA: Diagnosis not present

## 2023-12-12 DIAGNOSIS — B353 Tinea pedis: Secondary | ICD-10-CM | POA: Diagnosis not present

## 2024-01-26 ENCOUNTER — Other Ambulatory Visit: Payer: Self-pay | Admitting: Internal Medicine

## 2024-01-30 NOTE — Telephone Encounter (Signed)
 Pt hasn't seen pcp since 11/2022. Needs appt.   Attempted to reach pt. Left a voicemail to call us  back.

## 2024-02-01 ENCOUNTER — Ambulatory Visit (INDEPENDENT_AMBULATORY_CARE_PROVIDER_SITE_OTHER): Admitting: Family Medicine

## 2024-02-01 ENCOUNTER — Encounter: Payer: Self-pay | Admitting: Family Medicine

## 2024-02-01 VITALS — BP 150/85 | HR 88 | Temp 98.2°F | Resp 16 | Ht 62.25 in | Wt 169.0 lb

## 2024-02-01 DIAGNOSIS — E039 Hypothyroidism, unspecified: Secondary | ICD-10-CM | POA: Diagnosis not present

## 2024-02-01 DIAGNOSIS — F3341 Major depressive disorder, recurrent, in partial remission: Secondary | ICD-10-CM

## 2024-02-01 DIAGNOSIS — I10 Essential (primary) hypertension: Secondary | ICD-10-CM | POA: Diagnosis not present

## 2024-02-01 DIAGNOSIS — G4700 Insomnia, unspecified: Secondary | ICD-10-CM | POA: Diagnosis not present

## 2024-02-01 MED ORDER — BUPROPION HCL ER (XL) 300 MG PO TB24: ORAL_TABLET | ORAL | 1 refills | Status: DC

## 2024-02-01 MED ORDER — TRAZODONE HCL 50 MG PO TABS
25.0000 mg | ORAL_TABLET | Freq: Every evening | ORAL | 0 refills | Status: AC | PRN
Start: 2024-02-01 — End: ?

## 2024-02-01 NOTE — Patient Instructions (Addendum)
 A few things to remember from today's visit:  Hypothyroidism, unspecified type - Plan: TSH, T4, free, T4, free, TSH  Essential hypertension - Plan: Basic metabolic panel with GFR, Basic metabolic panel with GFR  Insomnia, unspecified type - Plan: traZODone (DESYREL) 50 MG tablet  No changes in blood pressure medications, monitor blood pressure at home and be sure it is under 140/90. Trazodone to take 30-60 min before bed time, dedicate at least 8 hours to sleep. No changes in rest.  If you need refills for medications you take chronically, please call your pharmacy. Do not use My Chart to request refills or for acute issues that need immediate attention. If you send a my chart message, it may take a few days to be addressed, specially if I am not in the office.  Please be sure medication list is accurate. If a new problem present, please set up appointment sooner than planned today.

## 2024-02-01 NOTE — Progress Notes (Unsigned)
 HPI: [name] is a 78 y.o. female with a PMHx significant for HTN, hypothyroidism, OA, HLD, and vitamin D deficiency, who is here today for medication management. Her PCP is not available at this time and she is leaving town [employer] account [employer].  Last seen on 07/11/2023  Hypertension:  Medications: Currently on losartan  100 mg daily and amlodipine  10 mg daily.  BP readings at home: Not checking BP at home. Her BP in the office today is 150/88. She says she has been having increased stress because she is about to move.  Side effects: none Vision: She is due for an eye exam.  Negative for unusual or severe headache, visual changes, exertional chest pain, dyspnea,  focal weakness, or edema.  Lab Results  Component Value Date   CREATININE 0.66 11/22/2022   BUN 11 11/22/2022   NA 141 11/22/2022   K 4.3 11/22/2022   CL 105 11/22/2022   CO2 23 11/22/2022   Hypothyroidism:  Currently on levothyroxine  88 mcg daily.  Lab Results  Component Value Date   TSH 0.77 11/22/2022   Insomnia:  She says she is having problems falling asleep due to stress. She slept last night, but had not slept the three days prior to that.  She has tried several OTCs, none of which have been effective.   Depression:  Currently on Wellbutrin  XL 300 mg daily.  Reports problem as well controlled.  Review of Systems  Constitutional:  Negative for activity change, appetite change and fever.  HENT:  Negative for sore throat.   Respiratory:  Negative for cough and wheezing.   Gastrointestinal:  Negative for abdominal pain, nausea and vomiting.  Endocrine: Negative for cold intolerance and heat intolerance.  Genitourinary:  Negative for decreased urine volume, dysuria and hematuria.  Skin:  Negative for rash.  Neurological:  Negative for syncope and facial asymmetry.  Psychiatric/Behavioral:  Negative for confusion. The patient is nervous/anxious.   See other pertinent positives and negatives in  HPI.  Current Outpatient Medications on File Prior to Visit  Medication Sig Dispense Refill   amLODipine  (NORVASC ) 10 MG tablet TAKE 1 TABLET(10 MG) BY MOUTH DAILY 90 tablet 1   diphenhydrAMINE  HCl (ZZZQUIL) 50 MG/30ML LIQD Take 15-30 mLs by mouth at bedtime.     levothyroxine  (SYNTHROID ) 88 MCG tablet TAKE 1 TABLET(88 MCG) BY MOUTH DAILY 90 tablet 1   losartan  (COZAAR ) 100 MG tablet TAKE 1 TABLET BY MOUTH DAILY 90 tablet 2   rosuvastatin  (CRESTOR ) 10 MG tablet TAKE 1 TABLET(10 MG) BY MOUTH DAILY 90 tablet 1   temazepam  (RESTORIL ) 30 MG capsule Take 1 capsule (30 mg total) by mouth at bedtime as needed for sleep. 30 capsule 0   No current facility-administered medications on file prior to visit.    Past Medical History:  Diagnosis Date   Chronic insomnia    Depression    Heavy alcohol use 07/27/2010   Qualifier: Diagnosis of  By: Ethel Henry MD, [name] use      Hyperlipidemia    Hypertension    INSOMNIA UNSPECIFIED 10/29/2007   Qualifier: Diagnosis of  By: Ethel Henry MD, Joaquim Muir    LIVER FUNCTION TESTS, ABNORMAL 12/21/2008   Qualifier: Diagnosis of  By: Ethel Henry MD, Joaquim Muir    Thyroiditis    positive antibodies   Allergies  Allergen Reactions   Lisinopril  Cough   Social History   Socioeconomic History   Marital status: Widowed    Spouse name: Not on file  Number of children: Not on file   Years of education: Not on file   Highest education level: Not on file  Occupational History   Not on file  Tobacco Use   Smoking status: Former    Current packs/day: 0.00    Types: Cigarettes    Start date: 07/24/1964    Quit date: 07/25/1999    Years since quitting: 24.5   Smokeless tobacco: Never  Vaping Use   Vaping status: Never Used  Substance and Sexual Activity   Alcohol use: Yes    Alcohol/week: 14.0 standard drinks of alcohol    Types: 14 Glasses of wine per week    Comment: heavy alcohol use,  "its been years since ive gone a day without alcholol but i didnt feel sick  when i wasnt drinking"    Drug use: No   Sexual activity: Not on file  Other Topics Concern   Not on file  Social History Narrative      hh of 1   Part time [employer] english as a second language    Now retired    [facility] of 1 no pets   G2P2   Social Drivers of Corporate investment banker Strain: Low Risk  (11/17/2022)   Overall Financial Resource Strain (CARDIA)    Difficulty of Paying Living Expenses: Not hard at all  Food Insecurity: No Food Insecurity (11/17/2022)   Hunger Vital Sign    Worried About Running Out of Food in the Last Year: Never true    Ran Out of Food in the Last Year: Never true  Transportation Needs: No Transportation Needs (11/17/2022)   PRAPARE - Administrator, Civil Service (Medical): No    Lack of Transportation (Non-Medical): No  Physical Activity: Insufficiently Active (11/17/2022)   Exercise Vital Sign    Days of Exercise per Week: 2 days    Minutes of Exercise per Session: 30 min  Stress: No Stress Concern Present (11/17/2022)   [employer] of Occupational Health - Occupational Stress Questionnaire    Feeling of Stress : Not at all  Social Connections: Moderately Integrated (11/17/2022)   Social Connection and Isolation Panel [NHANES]    Frequency of Communication with Friends and Family: More than three times a week    Frequency of Social Gatherings with Friends and Family: More than three times a week    Attends Religious Services: More than 4 times per year    Active Member of [employer] or Organizations: Yes    Attends Banker Meetings: More than 4 times per year    Marital Status: Widowed   Today's Vitals   02/01/24 1606  BP: (!) 150/88  Pulse: 88  Resp: 16  Temp: 98.2 F (36.8 C)  TempSrc: Oral  SpO2: 95%  Weight: 169 lb (76.7 kg)  Height: 5' 2.25" (1.581 m)   Body mass index is 30.66 kg/m.  Physical Exam Vitals and nursing note reviewed.  Constitutional:      General: She is not in acute distress.     Appearance: She is well-developed.  HENT:     Head: Normocephalic and atraumatic.     Mouth/Throat:     Mouth: Mucous membranes are moist.     Pharynx: Oropharynx is clear. Uvula midline.  Eyes:     Conjunctiva/sclera: Conjunctivae normal.  Neck:     Thyroid : No thyroid  mass or thyromegaly.  Cardiovascular:     Rate and Rhythm: Normal rate and regular rhythm.  Pulses:          Dorsalis pedis pulses are 2+ on the right side and 2+ on the left side.     Heart sounds: No murmur heard. Pulmonary:     Effort: Pulmonary effort is normal. No respiratory distress.     Breath sounds: Normal breath sounds.  Abdominal:     Palpations: Abdomen is soft. There is no hepatomegaly or mass.     Tenderness: There is no abdominal tenderness.  Musculoskeletal:     Right lower leg: No edema.     Left lower leg: No edema.  Lymphadenopathy:     Cervical: No cervical adenopathy.  Skin:    General: Skin is warm.     Findings: No erythema or rash.  Neurological:     General: No focal deficit present.     Mental Status: She is alert and oriented to person, place, and time.     Cranial Nerves: No cranial nerve deficit.     Gait: Gait normal.  Psychiatric:        Mood and Affect: Affect normal. Mood is anxious.   ASSESSMENT AND PLAN:  Ms. De Howell was seen today for chronic disease management.   Orders Placed This Encounter  Procedures   TSH   T4, free   Basic metabolic panel with GFR  *** Insomnia, unspecified type Requesting medication for short period of time to help with sleep, problem aggravated by stress. Trazodone 50 mg 1/2-1 tab around bedtime as needed. We discussed some side effects. Good sleep hygiene.  -     traZODone HCl; Take 0.5-1 tablets (25-50 mg total) by mouth at bedtime as needed for sleep.  Dispense: 15 tablet; Refill: 0  Hypothyroidism, unspecified type Assessment & Plan: Problem has been well controlled. Currently on Levothyroxine  88 mcg daily. Further  recommendations according to TSH result.  Orders: -     TSH; Future -     T4, free; Future  Essential hypertension Assessment & Plan: BP elevated today. She is not monitoring BP at home, recommend doing so. We discussed possible complications of elevated BP. Continue Amlodipine  5 mg daily and Losartan  100 mg daily as well as low salt diet.  Orders: -     Basic metabolic panel with GFR; Future  Recurrent major depressive disorder, in partial remission [facility]) Assessment & Plan: Problem is stable. Continue Wellbutrin  XL 300 mg daily. She is moving to [place]. F/U with new PCP in 6 months, before if needed.  Orders: -     buPROPion  HCl ER (XL); TAKE 1 TABLET(300 MG) BY MOUTH DAILY  Dispense: 90 tablet; Refill: 1  Return in about 6 months (around 08/03/2024) for f/u with PCP.  I, Fritz Jewel Wierda, acting as a scribe for Tiffancy Moger [place], MD., have documented all relevant documentation on the behalf of Selen Smucker [place], MD, as directed by  Lomax Poehler [place], MD while in the presence of Julaine Zimny [place], MD.   I, Mamoru Takeshita [place], MD, have reviewed all documentation for this visit. The documentation on 02/01/24 for the exam, diagnosis, procedures, and orders are all accurate and complete.  Ermel Verne G. [place], MD  [facility]. [place] office.

## 2024-02-01 NOTE — Assessment & Plan Note (Signed)
 Problem is stable. Continue Wellbutrin  XL 300 mg daily. She is moving to [place]. F/U with new PCP in 6 months, before if needed.

## 2024-02-01 NOTE — Assessment & Plan Note (Addendum)
 BP elevated today. She is not monitoring BP at home, recommend doing so. We discussed possible complications of elevated BP. Continue Amlodipine  5 mg daily and Losartan  100 mg daily as well as low salt diet.

## 2024-02-01 NOTE — Assessment & Plan Note (Signed)
 Problem has been well controlled. Currently on Levothyroxine  88 mcg daily. Further recommendations according to TSH result.

## 2024-02-02 ENCOUNTER — Encounter: Payer: Self-pay | Admitting: Family Medicine

## 2024-02-02 LAB — BASIC METABOLIC PANEL WITH GFR
BUN/Creatinine Ratio: 22 (ref 12–28)
BUN: 14 mg/dL (ref 8–27)
CO2: 23 mmol/L (ref 20–29)
Calcium: 9.9 mg/dL (ref 8.7–10.3)
Chloride: 105 mmol/L (ref 96–106)
Creatinine, Ser: 0.65 mg/dL (ref 0.57–1.00)
Glucose: 73 mg/dL (ref 70–99)
Potassium: 4.3 mmol/L (ref 3.5–5.2)
Sodium: 142 mmol/L (ref 134–144)
eGFR: 89 mL/min/{1.73_m2} (ref >59)

## 2024-02-02 LAB — TSH: TSH: 3.37 u[IU]/mL (ref 0.450–4.500)

## 2024-02-02 LAB — T4, FREE: Free T4: 1.27 ng/dL (ref 0.82–1.77)

## 2024-02-02 MED ORDER — LEVOTHYROXINE SODIUM 88 MCG PO TABS: 88.0000 ug | ORAL_TABLET | Freq: Every day | ORAL | 2 refills | Status: AC

## 2024-02-13 DIAGNOSIS — B351 Tinea unguium: Secondary | ICD-10-CM | POA: Diagnosis not present

## 2024-02-13 DIAGNOSIS — B353 Tinea pedis: Secondary | ICD-10-CM | POA: Diagnosis not present

## 2024-02-13 DIAGNOSIS — M19071 Primary osteoarthritis, right ankle and foot: Secondary | ICD-10-CM | POA: Diagnosis not present

## 2024-02-27 DIAGNOSIS — B351 Tinea unguium: Secondary | ICD-10-CM | POA: Diagnosis not present

## 2024-02-29 ENCOUNTER — Other Ambulatory Visit: Payer: Self-pay | Admitting: Internal Medicine

## 2024-03-19 DIAGNOSIS — M79672 Pain in left foot: Secondary | ICD-10-CM | POA: Diagnosis not present

## 2024-03-19 DIAGNOSIS — M19071 Primary osteoarthritis, right ankle and foot: Secondary | ICD-10-CM | POA: Diagnosis not present

## 2024-03-19 DIAGNOSIS — B353 Tinea pedis: Secondary | ICD-10-CM | POA: Diagnosis not present

## 2024-03-19 DIAGNOSIS — B351 Tinea unguium: Secondary | ICD-10-CM | POA: Diagnosis not present

## 2024-04-16 DIAGNOSIS — M19071 Primary osteoarthritis, right ankle and foot: Secondary | ICD-10-CM | POA: Diagnosis not present

## 2024-04-16 DIAGNOSIS — B351 Tinea unguium: Secondary | ICD-10-CM | POA: Diagnosis not present

## 2024-04-16 DIAGNOSIS — B353 Tinea pedis: Secondary | ICD-10-CM | POA: Diagnosis not present

## 2024-04-28 DIAGNOSIS — N390 Urinary tract infection, site not specified: Secondary | ICD-10-CM | POA: Diagnosis not present

## 2024-04-29 DIAGNOSIS — R300 Dysuria: Secondary | ICD-10-CM | POA: Diagnosis not present

## 2024-06-14 ENCOUNTER — Other Ambulatory Visit: Payer: Self-pay | Admitting: Family

## 2024-06-19 ENCOUNTER — Telehealth: Payer: Self-pay

## 2024-06-19 NOTE — Telephone Encounter (Signed)
 Copied from CRM [id number]. Topic: Clinical - Medication Question >> Jun 18, 2024 11:14 AM Anairis L wrote: Reason for CRM:  Michaela with [facility] pharmacy is calling to request a refill of amLODipine  (NORVASC ) 10 MG tablet  [id number] [place], [place] - [address] AT [facility] [address] [place] [address] Phone: [phone] Fax: [phone]

## 2024-06-23 NOTE — Telephone Encounter (Signed)
 Ok to refill 90 days  of amlodipine 

## 2024-06-24 ENCOUNTER — Other Ambulatory Visit: Payer: Self-pay | Admitting: Family

## 2024-06-24 MED ORDER — AMLODIPINE BESYLATE 10 MG PO TABS: ORAL_TABLET | ORAL | 0 refills | Status: AC

## 2024-06-24 NOTE — Telephone Encounter (Signed)
Pt is aware that Rx is sent.

## 2024-06-24 NOTE — Addendum Note (Signed)
 Addended by: Gray Doering on: 06/24/2024 05:23 PM   Modules accepted: Orders

## 2024-06-25 ENCOUNTER — Telehealth: Payer: Self-pay

## 2024-06-25 NOTE — Telephone Encounter (Signed)
 Copied from CRM #[id number]. Topic: Clinical - Prescription Issue >> Jun 23, 2024  2:45 PM Chiquita SQUIBB wrote: Reason for CRM: Patient is calling in stating that her medication was sent to the wrong pharmacy for the amLODipine  (NORVASC ) 10 MG tablet [[id number]]. Patient needs this to go to [facility] #[id number] - [place], [place] - [address] AT [address] & [address]. Please advise patient of any questions.

## 2024-06-26 ENCOUNTER — Other Ambulatory Visit: Payer: Self-pay | Admitting: Internal Medicine

## 2024-06-30 NOTE — Telephone Encounter (Signed)
 Rx was sent to   [facility] #[id number] - [place], [place] - [address] AT [address] (Ph: [phone])    Attempted to reach pt to follow up. Left a voicemail to call us  back.

## 2024-09-07 ENCOUNTER — Other Ambulatory Visit: Payer: Self-pay | Admitting: Family

## 2024-09-07 ENCOUNTER — Other Ambulatory Visit: Payer: Self-pay | Admitting: Family Medicine

## 2024-09-07 DIAGNOSIS — F3341 Major depressive disorder, recurrent, in partial remission: Secondary | ICD-10-CM
